# Patient Record
Sex: Female | Born: 1935 | Race: Black or African American | Hispanic: No | Smoking: Never smoker
Health system: Southern US, Community
[De-identification: ages and names within clinical notes are randomized; demographics above are authoritative.]

## PROBLEM LIST (undated history)

## (undated) DIAGNOSIS — H9313 Tinnitus, bilateral: Secondary | ICD-10-CM

## (undated) DIAGNOSIS — I10 Essential (primary) hypertension: Secondary | ICD-10-CM

## (undated) DIAGNOSIS — N189 Chronic kidney disease, unspecified: Secondary | ICD-10-CM

## (undated) DIAGNOSIS — H903 Sensorineural hearing loss, bilateral: Secondary | ICD-10-CM

## (undated) DIAGNOSIS — K8020 Calculus of gallbladder without cholecystitis without obstruction: Secondary | ICD-10-CM

## (undated) DIAGNOSIS — R2681 Unsteadiness on feet: Secondary | ICD-10-CM

## (undated) DIAGNOSIS — D509 Iron deficiency anemia, unspecified: Secondary | ICD-10-CM

## (undated) DIAGNOSIS — K222 Esophageal obstruction: Secondary | ICD-10-CM

## (undated) DIAGNOSIS — N840 Polyp of corpus uteri: Secondary | ICD-10-CM

## (undated) DIAGNOSIS — K449 Diaphragmatic hernia without obstruction or gangrene: Secondary | ICD-10-CM

## (undated) DIAGNOSIS — K5730 Diverticulosis of large intestine without perforation or abscess without bleeding: Secondary | ICD-10-CM

## (undated) DIAGNOSIS — K922 Gastrointestinal hemorrhage, unspecified: Secondary | ICD-10-CM

## (undated) DIAGNOSIS — M8580 Other specified disorders of bone density and structure, unspecified site: Secondary | ICD-10-CM

## (undated) HISTORY — DX: Sensorineural hearing loss, bilateral: H90.3

## (undated) HISTORY — DX: Iron deficiency anemia, unspecified: D50.9

## (undated) HISTORY — DX: Calculus of gallbladder without cholecystitis without obstruction: K80.20

## (undated) HISTORY — DX: Chronic kidney disease, unspecified: N18.9

## (undated) HISTORY — DX: Polyp of corpus uteri: N84.0

## (undated) HISTORY — DX: Diverticulosis of large intestine without perforation or abscess without bleeding: K57.30

## (undated) HISTORY — DX: Unsteadiness on feet: R26.81

## (undated) HISTORY — DX: Essential (primary) hypertension: I10

## (undated) HISTORY — PX: HYSTEROSCOPY: SHX211

## (undated) HISTORY — DX: Diaphragmatic hernia without obstruction or gangrene: K44.9

## (undated) HISTORY — DX: Tinnitus, bilateral: H93.13

## (undated) HISTORY — DX: Other specified disorders of bone density and structure, unspecified site: M85.80

## (undated) HISTORY — DX: Gastrointestinal hemorrhage, unspecified: K92.2

## (undated) HISTORY — DX: Esophageal obstruction: K22.2

---

## 1989-12-02 HISTORY — PX: HEMICOLECTOMY: SHX854

## 1998-03-06 ENCOUNTER — Encounter: Admission: RE | Admit: 1998-03-06 | Discharge: 1998-03-06 | Payer: Self-pay | Admitting: Internal Medicine

## 1998-05-01 ENCOUNTER — Encounter: Admission: RE | Admit: 1998-05-01 | Discharge: 1998-05-01 | Payer: Self-pay | Admitting: Internal Medicine

## 1998-05-01 ENCOUNTER — Other Ambulatory Visit: Admission: RE | Admit: 1998-05-01 | Discharge: 1998-05-01 | Payer: Self-pay | Admitting: Internal Medicine

## 1998-05-19 ENCOUNTER — Encounter: Admission: RE | Admit: 1998-05-19 | Discharge: 1998-05-19 | Payer: Self-pay | Admitting: Obstetrics & Gynecology

## 1998-08-08 ENCOUNTER — Encounter: Admission: RE | Admit: 1998-08-08 | Discharge: 1998-08-08 | Payer: Self-pay | Admitting: Internal Medicine

## 1998-11-06 ENCOUNTER — Encounter: Admission: RE | Admit: 1998-11-06 | Discharge: 1998-11-06 | Payer: Self-pay | Admitting: Internal Medicine

## 1999-02-07 ENCOUNTER — Encounter: Admission: RE | Admit: 1999-02-07 | Discharge: 1999-02-07 | Payer: Self-pay | Admitting: Internal Medicine

## 1999-02-13 ENCOUNTER — Encounter: Payer: Self-pay | Admitting: Internal Medicine

## 1999-02-13 ENCOUNTER — Ambulatory Visit (HOSPITAL_COMMUNITY): Admission: RE | Admit: 1999-02-13 | Discharge: 1999-02-13 | Payer: Self-pay | Admitting: Internal Medicine

## 1999-06-06 ENCOUNTER — Encounter: Admission: RE | Admit: 1999-06-06 | Discharge: 1999-06-06 | Payer: Self-pay | Admitting: Internal Medicine

## 1999-07-12 ENCOUNTER — Encounter: Admission: RE | Admit: 1999-07-12 | Discharge: 1999-07-12 | Payer: Self-pay | Admitting: Internal Medicine

## 1999-07-26 ENCOUNTER — Encounter: Admission: RE | Admit: 1999-07-26 | Discharge: 1999-07-26 | Payer: Self-pay | Admitting: Obstetrics

## 1999-09-13 ENCOUNTER — Encounter: Admission: RE | Admit: 1999-09-13 | Discharge: 1999-09-13 | Payer: Self-pay | Admitting: Obstetrics

## 1999-09-13 ENCOUNTER — Other Ambulatory Visit: Admission: RE | Admit: 1999-09-13 | Discharge: 1999-09-13 | Payer: Self-pay | Admitting: *Deleted

## 1999-09-27 ENCOUNTER — Encounter: Admission: RE | Admit: 1999-09-27 | Discharge: 1999-09-27 | Payer: Self-pay | Admitting: Obstetrics

## 1999-11-21 ENCOUNTER — Encounter: Admission: RE | Admit: 1999-11-21 | Discharge: 1999-11-21 | Payer: Self-pay | Admitting: Internal Medicine

## 2000-04-16 ENCOUNTER — Encounter: Admission: RE | Admit: 2000-04-16 | Discharge: 2000-04-16 | Payer: Self-pay | Admitting: Internal Medicine

## 2000-04-22 ENCOUNTER — Encounter: Payer: Self-pay | Admitting: Internal Medicine

## 2000-04-22 ENCOUNTER — Ambulatory Visit (HOSPITAL_COMMUNITY): Admission: RE | Admit: 2000-04-22 | Discharge: 2000-04-22 | Payer: Self-pay | Admitting: Internal Medicine

## 2000-04-30 ENCOUNTER — Encounter: Admission: RE | Admit: 2000-04-30 | Discharge: 2000-04-30 | Payer: Self-pay | Admitting: Internal Medicine

## 2000-06-30 ENCOUNTER — Encounter: Admission: RE | Admit: 2000-06-30 | Discharge: 2000-06-30 | Payer: Self-pay | Admitting: Internal Medicine

## 2000-07-01 ENCOUNTER — Encounter: Admission: RE | Admit: 2000-07-01 | Discharge: 2000-07-01 | Payer: Self-pay | Admitting: Obstetrics

## 2000-08-06 ENCOUNTER — Encounter: Admission: RE | Admit: 2000-08-06 | Discharge: 2000-08-06 | Payer: Self-pay | Admitting: Internal Medicine

## 2000-08-14 ENCOUNTER — Encounter: Admission: RE | Admit: 2000-08-14 | Discharge: 2000-08-14 | Payer: Self-pay | Admitting: Hematology and Oncology

## 2000-10-03 ENCOUNTER — Encounter: Admission: RE | Admit: 2000-10-03 | Discharge: 2000-10-03 | Payer: Self-pay | Admitting: Obstetrics & Gynecology

## 2000-10-20 ENCOUNTER — Ambulatory Visit (HOSPITAL_COMMUNITY): Admission: RE | Admit: 2000-10-20 | Discharge: 2000-10-20 | Payer: Self-pay | Admitting: Obstetrics & Gynecology

## 2000-10-20 ENCOUNTER — Encounter (INDEPENDENT_AMBULATORY_CARE_PROVIDER_SITE_OTHER): Payer: Self-pay | Admitting: Specialist

## 2000-10-20 DIAGNOSIS — N840 Polyp of corpus uteri: Secondary | ICD-10-CM | POA: Insufficient documentation

## 2001-01-21 ENCOUNTER — Encounter: Admission: RE | Admit: 2001-01-21 | Discharge: 2001-01-21 | Payer: Self-pay | Admitting: Internal Medicine

## 2001-03-11 ENCOUNTER — Encounter: Admission: RE | Admit: 2001-03-11 | Discharge: 2001-03-11 | Payer: Self-pay | Admitting: Internal Medicine

## 2001-04-23 ENCOUNTER — Ambulatory Visit (HOSPITAL_COMMUNITY): Admission: RE | Admit: 2001-04-23 | Discharge: 2001-04-23 | Payer: Self-pay | Admitting: Internal Medicine

## 2001-07-23 ENCOUNTER — Other Ambulatory Visit: Admission: RE | Admit: 2001-07-23 | Discharge: 2001-07-23 | Payer: Self-pay | Admitting: Internal Medicine

## 2001-07-23 ENCOUNTER — Encounter (INDEPENDENT_AMBULATORY_CARE_PROVIDER_SITE_OTHER): Payer: Self-pay | Admitting: Specialist

## 2001-08-12 ENCOUNTER — Encounter: Admission: RE | Admit: 2001-08-12 | Discharge: 2001-08-12 | Payer: Self-pay | Admitting: Internal Medicine

## 2001-08-27 ENCOUNTER — Encounter: Admission: RE | Admit: 2001-08-27 | Discharge: 2001-08-27 | Payer: Self-pay | Admitting: Internal Medicine

## 2001-09-28 ENCOUNTER — Encounter: Admission: RE | Admit: 2001-09-28 | Discharge: 2001-09-28 | Payer: Self-pay

## 2001-10-28 ENCOUNTER — Encounter: Admission: RE | Admit: 2001-10-28 | Discharge: 2001-10-28 | Payer: Self-pay | Admitting: Internal Medicine

## 2002-01-06 ENCOUNTER — Ambulatory Visit (HOSPITAL_COMMUNITY): Admission: RE | Admit: 2002-01-06 | Discharge: 2002-01-06 | Payer: Self-pay | Admitting: *Deleted

## 2002-01-06 ENCOUNTER — Encounter: Admission: RE | Admit: 2002-01-06 | Discharge: 2002-01-06 | Payer: Self-pay | Admitting: Internal Medicine

## 2002-02-03 ENCOUNTER — Encounter: Admission: RE | Admit: 2002-02-03 | Discharge: 2002-02-03 | Payer: Self-pay | Admitting: Internal Medicine

## 2002-04-28 ENCOUNTER — Ambulatory Visit (HOSPITAL_COMMUNITY): Admission: RE | Admit: 2002-04-28 | Discharge: 2002-04-28 | Payer: Self-pay | Admitting: Internal Medicine

## 2002-04-28 ENCOUNTER — Encounter: Payer: Self-pay | Admitting: Internal Medicine

## 2002-05-05 ENCOUNTER — Encounter: Admission: RE | Admit: 2002-05-05 | Discharge: 2002-05-05 | Payer: Self-pay | Admitting: Internal Medicine

## 2002-07-07 ENCOUNTER — Encounter: Admission: RE | Admit: 2002-07-07 | Discharge: 2002-07-07 | Payer: Self-pay | Admitting: Internal Medicine

## 2002-10-20 ENCOUNTER — Encounter: Admission: RE | Admit: 2002-10-20 | Discharge: 2002-10-20 | Payer: Self-pay | Admitting: Internal Medicine

## 2003-01-12 ENCOUNTER — Encounter: Admission: RE | Admit: 2003-01-12 | Discharge: 2003-01-12 | Payer: Self-pay | Admitting: Internal Medicine

## 2003-05-18 ENCOUNTER — Encounter: Admission: RE | Admit: 2003-05-18 | Discharge: 2003-05-18 | Payer: Self-pay | Admitting: Internal Medicine

## 2003-06-22 ENCOUNTER — Inpatient Hospital Stay (HOSPITAL_COMMUNITY): Admission: EM | Admit: 2003-06-22 | Discharge: 2003-06-29 | Payer: Self-pay | Admitting: *Deleted

## 2003-06-22 ENCOUNTER — Encounter: Payer: Self-pay | Admitting: Internal Medicine

## 2003-06-23 ENCOUNTER — Encounter: Payer: Self-pay | Admitting: Internal Medicine

## 2003-06-24 ENCOUNTER — Encounter: Payer: Self-pay | Admitting: Internal Medicine

## 2003-06-29 ENCOUNTER — Inpatient Hospital Stay: Admission: RE | Admit: 2003-06-29 | Discharge: 2003-07-06 | Payer: Self-pay | Admitting: Internal Medicine

## 2003-07-20 ENCOUNTER — Encounter: Admission: RE | Admit: 2003-07-20 | Discharge: 2003-07-20 | Payer: Self-pay | Admitting: Internal Medicine

## 2003-08-04 ENCOUNTER — Ambulatory Visit (HOSPITAL_COMMUNITY): Admission: RE | Admit: 2003-08-04 | Discharge: 2003-08-04 | Payer: Self-pay | Admitting: Internal Medicine

## 2003-09-14 ENCOUNTER — Inpatient Hospital Stay (HOSPITAL_COMMUNITY): Admission: EM | Admit: 2003-09-14 | Discharge: 2003-09-17 | Payer: Self-pay | Admitting: Emergency Medicine

## 2003-10-05 ENCOUNTER — Encounter: Admission: RE | Admit: 2003-10-05 | Discharge: 2003-10-05 | Payer: Self-pay | Admitting: Internal Medicine

## 2003-12-15 ENCOUNTER — Encounter: Admission: RE | Admit: 2003-12-15 | Discharge: 2003-12-15 | Payer: Self-pay | Admitting: Internal Medicine

## 2004-03-07 ENCOUNTER — Encounter: Admission: RE | Admit: 2004-03-07 | Discharge: 2004-03-07 | Payer: Self-pay | Admitting: Internal Medicine

## 2004-04-18 ENCOUNTER — Encounter: Payer: Self-pay | Admitting: Internal Medicine

## 2004-04-18 ENCOUNTER — Encounter: Admission: RE | Admit: 2004-04-18 | Discharge: 2004-04-18 | Payer: Self-pay | Admitting: Internal Medicine

## 2004-06-20 ENCOUNTER — Encounter: Admission: RE | Admit: 2004-06-20 | Discharge: 2004-06-20 | Payer: Self-pay | Admitting: Internal Medicine

## 2004-08-22 ENCOUNTER — Ambulatory Visit: Payer: Self-pay | Admitting: Internal Medicine

## 2004-08-29 ENCOUNTER — Ambulatory Visit (HOSPITAL_COMMUNITY): Admission: RE | Admit: 2004-08-29 | Discharge: 2004-08-29 | Payer: Self-pay | Admitting: *Deleted

## 2004-09-03 ENCOUNTER — Ambulatory Visit: Payer: Self-pay | Admitting: Internal Medicine

## 2004-09-12 ENCOUNTER — Ambulatory Visit: Payer: Self-pay | Admitting: Internal Medicine

## 2004-09-18 ENCOUNTER — Ambulatory Visit: Payer: Self-pay | Admitting: Internal Medicine

## 2004-10-17 ENCOUNTER — Ambulatory Visit: Payer: Self-pay | Admitting: Internal Medicine

## 2004-10-18 ENCOUNTER — Ambulatory Visit: Payer: Self-pay | Admitting: Internal Medicine

## 2004-10-18 ENCOUNTER — Ambulatory Visit: Payer: Self-pay | Admitting: Gastroenterology

## 2004-10-18 ENCOUNTER — Inpatient Hospital Stay (HOSPITAL_COMMUNITY): Admission: EM | Admit: 2004-10-18 | Discharge: 2004-10-20 | Payer: Self-pay | Admitting: Emergency Medicine

## 2004-12-19 ENCOUNTER — Ambulatory Visit: Payer: Self-pay | Admitting: Internal Medicine

## 2005-02-13 ENCOUNTER — Ambulatory Visit: Payer: Self-pay | Admitting: Internal Medicine

## 2005-02-13 ENCOUNTER — Ambulatory Visit (HOSPITAL_COMMUNITY): Admission: RE | Admit: 2005-02-13 | Discharge: 2005-02-13 | Payer: Self-pay | Admitting: Internal Medicine

## 2005-02-18 ENCOUNTER — Ambulatory Visit (HOSPITAL_COMMUNITY): Admission: RE | Admit: 2005-02-18 | Discharge: 2005-02-18 | Payer: Self-pay | Admitting: Internal Medicine

## 2005-05-01 ENCOUNTER — Ambulatory Visit: Payer: Self-pay | Admitting: Internal Medicine

## 2005-09-04 ENCOUNTER — Ambulatory Visit: Payer: Self-pay | Admitting: Internal Medicine

## 2005-09-06 ENCOUNTER — Ambulatory Visit (HOSPITAL_COMMUNITY): Admission: RE | Admit: 2005-09-06 | Discharge: 2005-09-06 | Payer: Self-pay | Admitting: Internal Medicine

## 2005-10-08 ENCOUNTER — Ambulatory Visit: Payer: Self-pay | Admitting: Internal Medicine

## 2005-11-06 ENCOUNTER — Ambulatory Visit: Payer: Self-pay | Admitting: Internal Medicine

## 2005-11-20 ENCOUNTER — Ambulatory Visit: Payer: Self-pay | Admitting: Cardiology

## 2005-11-20 ENCOUNTER — Ambulatory Visit (HOSPITAL_COMMUNITY): Admission: RE | Admit: 2005-11-20 | Discharge: 2005-11-20 | Payer: Self-pay | Admitting: Internal Medicine

## 2005-11-20 ENCOUNTER — Encounter: Payer: Self-pay | Admitting: Cardiology

## 2006-01-22 ENCOUNTER — Ambulatory Visit: Payer: Self-pay | Admitting: Internal Medicine

## 2006-02-05 ENCOUNTER — Ambulatory Visit: Payer: Self-pay | Admitting: Internal Medicine

## 2006-02-19 ENCOUNTER — Ambulatory Visit: Payer: Self-pay | Admitting: Hospitalist

## 2006-03-05 ENCOUNTER — Ambulatory Visit: Payer: Self-pay | Admitting: Internal Medicine

## 2006-04-02 ENCOUNTER — Ambulatory Visit: Payer: Self-pay | Admitting: Internal Medicine

## 2006-05-07 ENCOUNTER — Ambulatory Visit: Payer: Self-pay | Admitting: Internal Medicine

## 2006-07-02 ENCOUNTER — Ambulatory Visit: Payer: Self-pay | Admitting: Internal Medicine

## 2006-09-09 ENCOUNTER — Ambulatory Visit (HOSPITAL_COMMUNITY): Admission: RE | Admit: 2006-09-09 | Discharge: 2006-09-09 | Payer: Self-pay | Admitting: Internal Medicine

## 2006-11-05 ENCOUNTER — Ambulatory Visit: Payer: Self-pay | Admitting: Internal Medicine

## 2006-11-05 LAB — CONVERTED CEMR LAB
ALT: 21 units/L (ref 0–35)
Albumin: 4.7 g/dL (ref 3.5–5.2)
Basophils Absolute: 0 10*3/uL (ref 0.0–0.1)
CO2: 25 meq/L (ref 19–32)
Chloride: 102 meq/L (ref 96–112)
Glucose, Bld: 85 mg/dL (ref 70–99)
Lymphocytes Relative: 39 % (ref 15–43)
MCHC: 33.6 g/dL (ref 33.1–35.4)
Neutro Abs: 3.2 10*3/uL (ref 1.8–6.8)
Neutrophils Relative %: 51 % (ref 47–77)
Platelets: 294 10*3/uL (ref 152–374)
Potassium: 4 meq/L (ref 3.5–5.3)
RDW: 15.2 % (ref 11.5–15.3)
Sodium: 141 meq/L (ref 135–145)
TSH: 1.409 microintl units/mL (ref 0.350–5.50)
Total Bilirubin: 0.5 mg/dL (ref 0.3–1.2)
Total Protein: 7.5 g/dL (ref 6.0–8.3)

## 2007-01-15 ENCOUNTER — Telehealth (INDEPENDENT_AMBULATORY_CARE_PROVIDER_SITE_OTHER): Payer: Self-pay | Admitting: *Deleted

## 2007-02-05 ENCOUNTER — Ambulatory Visit: Admission: RE | Admit: 2007-02-05 | Discharge: 2007-02-05 | Payer: Self-pay | Admitting: Internal Medicine

## 2007-02-05 ENCOUNTER — Encounter: Payer: Self-pay | Admitting: Internal Medicine

## 2007-02-27 ENCOUNTER — Telehealth (INDEPENDENT_AMBULATORY_CARE_PROVIDER_SITE_OTHER): Payer: Self-pay | Admitting: *Deleted

## 2007-04-22 ENCOUNTER — Ambulatory Visit: Payer: Self-pay | Admitting: Internal Medicine

## 2007-04-22 DIAGNOSIS — I10 Essential (primary) hypertension: Secondary | ICD-10-CM

## 2007-04-22 DIAGNOSIS — Z860100 Personal history of colon polyps, unspecified: Secondary | ICD-10-CM | POA: Insufficient documentation

## 2007-04-22 DIAGNOSIS — H903 Sensorineural hearing loss, bilateral: Secondary | ICD-10-CM

## 2007-04-22 DIAGNOSIS — M109 Gout, unspecified: Secondary | ICD-10-CM

## 2007-04-22 DIAGNOSIS — Z8711 Personal history of peptic ulcer disease: Secondary | ICD-10-CM

## 2007-04-22 DIAGNOSIS — H9319 Tinnitus, unspecified ear: Secondary | ICD-10-CM | POA: Insufficient documentation

## 2007-04-22 DIAGNOSIS — K5730 Diverticulosis of large intestine without perforation or abscess without bleeding: Secondary | ICD-10-CM | POA: Insufficient documentation

## 2007-04-22 DIAGNOSIS — R748 Abnormal levels of other serum enzymes: Secondary | ICD-10-CM | POA: Insufficient documentation

## 2007-04-22 DIAGNOSIS — Z9889 Other specified postprocedural states: Secondary | ICD-10-CM | POA: Insufficient documentation

## 2007-04-22 LAB — CONVERTED CEMR LAB
ALT: 17 units/L (ref 0–35)
AST: 23 units/L (ref 0–37)
Basophils Absolute: 0 10*3/uL (ref 0.0–0.1)
Basophils Relative: 1 % (ref 0–1)
Chloride: 103 meq/L (ref 96–112)
Creatinine, Ser: 1.15 mg/dL (ref 0.40–1.20)
Eosinophils Relative: 1 % (ref 0–5)
Hemoglobin: 11.9 g/dL — ABNORMAL LOW (ref 12.0–15.0)
MCHC: 32.3 g/dL (ref 30.0–36.0)
Monocytes Absolute: 0.7 10*3/uL (ref 0.2–0.7)
Neutro Abs: 3.7 10*3/uL (ref 1.7–7.7)
Platelets: 315 10*3/uL (ref 150–400)
RDW: 16 % — ABNORMAL HIGH (ref 11.5–14.0)
Sodium: 140 meq/L (ref 135–145)
Total Bilirubin: 0.6 mg/dL (ref 0.3–1.2)

## 2007-08-05 ENCOUNTER — Ambulatory Visit: Payer: Self-pay | Admitting: Internal Medicine

## 2007-08-05 LAB — CONVERTED CEMR LAB
ALT: 15 units/L (ref 0–35)
Alkaline Phosphatase: 163 units/L — ABNORMAL HIGH (ref 39–117)
Basophils Absolute: 0 10*3/uL (ref 0.0–0.1)
Basophils Relative: 1 % (ref 0–1)
Eosinophils Absolute: 0.1 10*3/uL (ref 0.0–0.7)
Eosinophils Relative: 1 % (ref 0–5)
HCT: 37.1 % (ref 36.0–46.0)
MCV: 87.1 fL (ref 78.0–100.0)
Platelets: 257 10*3/uL (ref 150–400)
RDW: 15.5 % — ABNORMAL HIGH (ref 11.5–14.0)
Sodium: 141 meq/L (ref 135–145)
Total Bilirubin: 0.5 mg/dL (ref 0.3–1.2)
Total Protein: 7.1 g/dL (ref 6.0–8.3)

## 2007-09-11 ENCOUNTER — Ambulatory Visit (HOSPITAL_COMMUNITY): Admission: RE | Admit: 2007-09-11 | Discharge: 2007-09-11 | Payer: Self-pay | Admitting: Internal Medicine

## 2007-11-16 ENCOUNTER — Ambulatory Visit: Payer: Self-pay | Admitting: Internal Medicine

## 2008-01-27 ENCOUNTER — Ambulatory Visit: Payer: Self-pay | Admitting: Internal Medicine

## 2008-02-09 ENCOUNTER — Ambulatory Visit: Payer: Self-pay | Admitting: Internal Medicine

## 2008-02-09 ENCOUNTER — Encounter: Payer: Self-pay | Admitting: Internal Medicine

## 2008-02-09 LAB — HM COLONOSCOPY

## 2008-04-13 ENCOUNTER — Ambulatory Visit: Payer: Self-pay | Admitting: Internal Medicine

## 2008-04-13 LAB — CONVERTED CEMR LAB
Alkaline Phosphatase: 151 units/L — ABNORMAL HIGH (ref 39–117)
BUN: 20 mg/dL (ref 6–23)
Creatinine, Ser: 1.08 mg/dL (ref 0.40–1.20)
Eosinophils Absolute: 0.1 10*3/uL (ref 0.0–0.7)
Eosinophils Relative: 1 % (ref 0–5)
Glucose, Bld: 94 mg/dL (ref 70–99)
Glucose, Urine, Semiquant: NEGATIVE
HCT: 38.4 % (ref 36.0–46.0)
HDL: 84 mg/dL (ref >39)
Hemoglobin, Urine: NEGATIVE
Ketones, ur: NEGATIVE mg/dL
LDL Cholesterol: 103 mg/dL — ABNORMAL HIGH (ref 0–99)
Lymphs Abs: 2.5 10*3/uL (ref 0.7–4.0)
MCV: 90.4 fL (ref 78.0–100.0)
Monocytes Relative: 10 % (ref 3–12)
Nitrite: NEGATIVE
Nitrite: NEGATIVE
Platelets: 270 10*3/uL (ref 150–400)
Protein, U semiquant: 30
Sodium: 142 meq/L (ref 135–145)
Specific Gravity, Urine: 1.01
Total Bilirubin: 0.6 mg/dL (ref 0.3–1.2)
Total CHOL/HDL Ratio: 2.4
Triglycerides: 89 mg/dL (ref <150)
Urobilinogen, UA: 0.2 (ref 0.0–1.0)
VLDL: 18 mg/dL (ref 0–40)
WBC: 6.5 10*3/uL (ref 4.0–10.5)

## 2008-06-25 ENCOUNTER — Telehealth (INDEPENDENT_AMBULATORY_CARE_PROVIDER_SITE_OTHER): Payer: Self-pay | Admitting: *Deleted

## 2008-10-13 ENCOUNTER — Ambulatory Visit (HOSPITAL_COMMUNITY): Admission: RE | Admit: 2008-10-13 | Discharge: 2008-10-13 | Payer: Self-pay | Admitting: Internal Medicine

## 2009-01-18 ENCOUNTER — Ambulatory Visit: Payer: Self-pay | Admitting: Internal Medicine

## 2009-01-18 DIAGNOSIS — R5383 Other fatigue: Secondary | ICD-10-CM

## 2009-01-18 DIAGNOSIS — R5381 Other malaise: Secondary | ICD-10-CM | POA: Insufficient documentation

## 2009-01-18 LAB — CONVERTED CEMR LAB
AST: 19 units/L (ref 0–37)
Albumin: 4.4 g/dL (ref 3.5–5.2)
BUN: 21 mg/dL (ref 6–23)
Basophils Relative: 0 % (ref 0–1)
Calcium: 8.9 mg/dL (ref 8.4–10.5)
Chloride: 100 meq/L (ref 96–112)
Creatinine, Ser: 1.01 mg/dL (ref 0.40–1.20)
Eosinophils Absolute: 0 10*3/uL (ref 0.0–0.7)
Glucose, Bld: 92 mg/dL (ref 70–99)
Hemoglobin: 12.9 g/dL (ref 12.0–15.0)
Lymphs Abs: 1.4 10*3/uL (ref 0.7–4.0)
MCHC: 32.9 g/dL (ref 30.0–36.0)
MCV: 89.7 fL (ref 78.0–100.0)
Monocytes Absolute: 0.8 10*3/uL (ref 0.1–1.0)
Monocytes Relative: 14 % — ABNORMAL HIGH (ref 3–12)
Neutro Abs: 3.8 10*3/uL (ref 1.7–7.7)
RBC: 4.37 M/uL (ref 3.87–5.11)
TSH: 1.081 microintl units/mL (ref 0.350–4.50)
WBC: 6 10*3/uL (ref 4.0–10.5)

## 2009-02-03 ENCOUNTER — Telehealth: Payer: Self-pay | Admitting: Internal Medicine

## 2009-04-05 ENCOUNTER — Ambulatory Visit: Payer: Self-pay | Admitting: Internal Medicine

## 2009-06-28 ENCOUNTER — Ambulatory Visit: Payer: Self-pay | Admitting: Internal Medicine

## 2009-10-16 ENCOUNTER — Encounter: Payer: Self-pay | Admitting: Internal Medicine

## 2009-10-16 ENCOUNTER — Ambulatory Visit (HOSPITAL_COMMUNITY): Admission: RE | Admit: 2009-10-16 | Discharge: 2009-10-16 | Payer: Self-pay | Admitting: Internal Medicine

## 2009-12-06 ENCOUNTER — Ambulatory Visit: Payer: Self-pay | Admitting: Internal Medicine

## 2009-12-06 DIAGNOSIS — M8580 Other specified disorders of bone density and structure, unspecified site: Secondary | ICD-10-CM | POA: Insufficient documentation

## 2009-12-07 ENCOUNTER — Encounter: Payer: Self-pay | Admitting: Internal Medicine

## 2009-12-07 DIAGNOSIS — E559 Vitamin D deficiency, unspecified: Secondary | ICD-10-CM | POA: Insufficient documentation

## 2010-01-01 ENCOUNTER — Telehealth: Payer: Self-pay | Admitting: Internal Medicine

## 2010-01-12 LAB — CONVERTED CEMR LAB
ALT: 13 units/L (ref 0–35)
AST: 36 units/L (ref 0–37)
Albumin: 4.9 g/dL (ref 3.5–5.2)
Alkaline Phosphatase: 125 units/L — ABNORMAL HIGH (ref 39–117)
BUN: 24 mg/dL — ABNORMAL HIGH (ref 6–23)
Calcium: 9.3 mg/dL (ref 8.4–10.5)
Chloride: 102 meq/L (ref 96–112)
Potassium: 4.3 meq/L (ref 3.5–5.3)
Sodium: 141 meq/L (ref 135–145)
Total Protein: 7.5 g/dL (ref 6.0–8.3)

## 2010-03-07 ENCOUNTER — Ambulatory Visit: Payer: Self-pay | Admitting: Internal Medicine

## 2010-03-07 LAB — CONVERTED CEMR LAB
BUN: 28 mg/dL — ABNORMAL HIGH (ref 6–23)
Calcium: 9.3 mg/dL (ref 8.4–10.5)
Cholesterol: 212 mg/dL — ABNORMAL HIGH (ref 0–200)
Creatinine, Ser: 1.39 mg/dL — ABNORMAL HIGH (ref 0.40–1.20)
Glucose, Bld: 90 mg/dL (ref 70–99)
Triglycerides: 87 mg/dL (ref <150)
VLDL: 17 mg/dL (ref 0–40)

## 2010-06-06 ENCOUNTER — Ambulatory Visit: Payer: Self-pay | Admitting: Internal Medicine

## 2010-06-06 DIAGNOSIS — N189 Chronic kidney disease, unspecified: Secondary | ICD-10-CM

## 2010-06-06 LAB — CONVERTED CEMR LAB
ALT: 16 units/L (ref 0–35)
Basophils Relative: 1 % (ref 0–1)
CO2: 21 meq/L (ref 19–32)
Calcium: 9.2 mg/dL (ref 8.4–10.5)
Chloride: 104 meq/L (ref 96–112)
Creatinine, Ser: 1.37 mg/dL — ABNORMAL HIGH (ref 0.40–1.20)
Eosinophils Absolute: 0 10*3/uL (ref 0.0–0.7)
Glucose, Bld: 87 mg/dL (ref 70–99)
Hemoglobin: 11.9 g/dL — ABNORMAL LOW (ref 12.0–15.0)
Lymphs Abs: 1.9 10*3/uL (ref 0.7–4.0)
MCHC: 33.6 g/dL (ref 30.0–36.0)
MCV: 88.5 fL (ref >=78.0)
Monocytes Absolute: 0.5 10*3/uL (ref 0.1–1.0)
Monocytes Relative: 9 % (ref 3–12)
Neutrophils Relative %: 58 % (ref 43–77)
RBC: 4 M/uL (ref 3.87–5.11)
Sodium: 143 meq/L (ref 135–145)
TSH: 1.052 microintl units/mL (ref 0.350–4.5)
Total Protein: 7.3 g/dL (ref 6.0–8.3)
WBC: 5.9 10*3/uL (ref 4.0–10.5)

## 2010-08-19 ENCOUNTER — Inpatient Hospital Stay (HOSPITAL_COMMUNITY): Admission: EM | Admit: 2010-08-19 | Discharge: 2010-08-21 | Payer: Self-pay | Admitting: Emergency Medicine

## 2010-08-19 ENCOUNTER — Ambulatory Visit: Payer: Self-pay | Admitting: Internal Medicine

## 2010-08-19 ENCOUNTER — Encounter: Payer: Self-pay | Admitting: Internal Medicine

## 2010-08-21 ENCOUNTER — Encounter: Payer: Self-pay | Admitting: Internal Medicine

## 2010-08-29 ENCOUNTER — Ambulatory Visit: Payer: Self-pay | Admitting: Internal Medicine

## 2010-08-29 DIAGNOSIS — D509 Iron deficiency anemia, unspecified: Secondary | ICD-10-CM | POA: Insufficient documentation

## 2010-08-29 LAB — CONVERTED CEMR LAB
Basophils Absolute: 0 10*3/uL (ref 0.0–0.1)
Lymphocytes Relative: 30 % (ref 12–46)
Lymphs Abs: 2.1 10*3/uL (ref 0.7–4.0)
Neutro Abs: 4.3 10*3/uL (ref 1.7–7.7)
Platelets: 320 10*3/uL (ref 150–400)
RDW: 15.5 % (ref 11.5–15.5)
WBC: 7 10*3/uL (ref 4.0–10.5)

## 2010-08-30 DIAGNOSIS — K921 Melena: Secondary | ICD-10-CM

## 2010-10-09 ENCOUNTER — Telehealth (INDEPENDENT_AMBULATORY_CARE_PROVIDER_SITE_OTHER): Payer: Self-pay | Admitting: *Deleted

## 2010-10-10 ENCOUNTER — Ambulatory Visit: Payer: Self-pay | Admitting: Internal Medicine

## 2010-10-15 DIAGNOSIS — K5900 Constipation, unspecified: Secondary | ICD-10-CM | POA: Insufficient documentation

## 2010-10-18 ENCOUNTER — Ambulatory Visit (HOSPITAL_COMMUNITY)
Admission: RE | Admit: 2010-10-18 | Discharge: 2010-10-18 | Payer: Self-pay | Source: Home / Self Care | Admitting: Internal Medicine

## 2010-10-18 LAB — HM MAMMOGRAPHY: HM Mammogram: NEGATIVE

## 2010-11-22 LAB — CONVERTED CEMR LAB
ALT: 17 units/L (ref 0–35)
AST: 29 units/L (ref 0–37)
Albumin: 4.7 g/dL (ref 3.5–5.2)
Alkaline Phosphatase: 115 units/L (ref 39–117)
BUN: 27 mg/dL — ABNORMAL HIGH (ref 6–23)
Basophils Absolute: 0 10*3/uL (ref 0.0–0.1)
Basophils Relative: 1 % (ref 0–1)
Chloride: 99 meq/L (ref 96–112)
Ferritin: 33 ng/mL (ref 10–291)
Hemoglobin: 11 g/dL — ABNORMAL LOW (ref 12.0–15.0)
MCHC: 33.2 g/dL (ref 30.0–36.0)
Monocytes Absolute: 0.6 10*3/uL (ref 0.1–1.0)
Neutro Abs: 3.5 10*3/uL (ref 1.7–7.7)
Platelets: 316 10*3/uL (ref 150–400)
Potassium: 3.9 meq/L (ref 3.5–5.3)
RDW: 15.9 % — ABNORMAL HIGH (ref 11.5–15.5)
Sodium: 138 meq/L (ref 135–145)
Total Protein: 7.3 g/dL (ref 6.0–8.3)

## 2010-12-12 ENCOUNTER — Ambulatory Visit: Admission: RE | Admit: 2010-12-12 | Discharge: 2010-12-12 | Payer: Self-pay | Source: Home / Self Care

## 2010-12-12 ENCOUNTER — Encounter: Payer: Self-pay | Admitting: Internal Medicine

## 2010-12-12 LAB — CONVERTED CEMR LAB
BUN: 22 mg/dL (ref 6–23)
Creatinine, Ser: 1.62 mg/dL — ABNORMAL HIGH (ref 0.40–1.20)
Potassium: 4 meq/L (ref 3.5–5.3)

## 2011-01-01 NOTE — Medication Information (Signed)
Summary: [facility]  [facility]   Imported By: Shon Hough 06/22/2010 14:16:45  _____________________________________________________________________  External Attachment:    Type:   Image     Comment:   External Document

## 2011-01-01 NOTE — Assessment & Plan Note (Signed)
Summary: EST-6 WEEK RECHECK/CH   Vital Signs:  Patient profile:   77 year old female Height:      59 inches Weight:      128.1 pounds BMI:     25.97 Temp:     97.8 degrees F oral Pulse rate:   74 / minute BP sitting:   125 / 83  (right arm)  Vitals Entered By: Filomena Jungling NT II (October 10, 2010 10:57 AM) CC: TROUBLE WITH BOWELS / FEELING TIRED Is Patient Diabetic? No Pain Assessment Patient in pain? no      Nutritional Status BMI of 25 - 29 = overweight  Have you ever been in a relationship where you felt threatened, hurt or afraid?No   Does patient need assistance? Functional Status Self care Ambulation Normal   Primary Care Provider:  Margarito Liner MD  CC:  TROUBLE WITH BOWELS / FEELING TIRED.  History of Present Illness: Patient returns for followup of her chronic problems including history of GI bleeding in September, anemia, hypertension, gout, and chronic renal insufficiency. Her main complaint today is constipation. She denies any blood in her stools or melena. She has had no recent gout flares. She reports that she is compliant with her medications. She does report a chronic feeling of fatigability with no significant change in that symptom.  Preventive Screening-Counseling & Management  Alcohol-Tobacco     Smoking Status: never  Current Medications (verified): 1)  Allopurinol 100 Mg Tabs (Allopurinol) .... Take 2 Tablets By Mouth Once A Day 2)  Famotidine 20 Mg Tabs (Famotidine) .... Take 1 Tablet By Mouth Once A Day 3)  Diltiazem Hcl Er Beads 420 Mg Cp24 (Diltiazem Hcl Er Beads) .... Take 1 Capsule By Mouth Once A Day 4)  Colchicine 0.6 Mg Tabs (Colchicine) .... Take 1 Tablet By Mouth Once A Day 5)  Cozaar 50 Mg Tabs (Losartan Potassium) .... Take 1 Tablet By Mouth Once A Day 6)  Calcium Carbonate-Vitamin D 600-400 Mg-Unit Tabs (Calcium Carbonate-Vitamin D) .... Take 1 Tablet By Mouth Two Times A Day  Allergies (verified): No Known Drug Allergies  Review  of Systems General:  Complains of fatigue. CV:  Denies chest pain or discomfort. Resp:  Denies shortness of breath. GI:  Complains of constipation; denies abdominal pain, bloody stools, dark tarry stools, nausea, and vomiting.  Physical Exam  General:  alert, no distress Lungs:  normal respiratory effort, normal breath sounds, no crackles, and no wheezes.   Heart:  normal rate, regular rhythm, no murmur, no gallop, and no rub.   Abdomen:  soft, non-tender, and normal bowel sounds.   Extremities:  no edema   Impression & Recommendations:  Problem # 1:  HYPERTENSION (ICD-401.9) Patient's blood pressure is at goal on current regimen; will continue current medications.  Her updated medication list for this problem includes:    Diltiazem Hcl Er Beads 420 Mg Cp24 (Diltiazem hcl er beads) .[name]... Take 1 capsule by mouth once a day    Cozaar 50 Mg Tabs (Losartan potassium) .[name]... Take 1 tablet by mouth once a day  BP today: 125/83 Prior BP: 128/77 (08/29/2010)  Prior 10 Yr Risk Heart Disease: 9 % (06/06/2010)  Labs Reviewed: K+: 3.9 (06/06/2010) Creat: : 1.37 (06/06/2010)   Chol: 212 (03/07/2010)   HDL: 69 (03/07/2010)   LDL: 126 (03/07/2010)   TG: 87 (03/07/2010)  Problem # 2:  RENAL INSUFFICIENCY (ICD-588.9) Will check a metabolic panel today.  Orders: T-CMP with Estimated GFR ([id number])  Problem #  3:  GOUT (ICD-274.9) Patient has had no recent gout flares. Will continue allopurinol and colchicine.  Her updated medication list for this problem includes:    Allopurinol 100 Mg Tabs (Allopurinol) .[name]... Take 2 tablets by mouth once a day    Colchicine 0.6 Mg Tabs (Colchicine) .[name]... Take 1 tablet by mouth once a day  Problem # 4:  ANEMIA-IRON DEFICIENCY (ICD-280.9) Will check a CBC with differential and ferritin level.  Orders: T-CBC w/Diff [id number]) T-Ferritin [id number])  Hgb: 9.6 (08/29/2010)   Hct: 29.0 (08/29/2010)   Platelets: 320 (08/29/2010) RBC: 3.27  (08/29/2010)   RDW: 15.5 (08/29/2010)   WBC: 7.0 (08/29/2010) MCV: 88.7 (08/29/2010)   MCHC: 33.1 (08/29/2010) TSH: 1.052 (06/06/2010)  Problem # 5:  DIVERTICULOSIS, COLON (ICD-562.10) Patient has had no further signs of GI bleeding since her hospitalization in September.  Will check a CBC today.  Labs Reviewed: Hgb: 9.6 (08/29/2010)   Hct: 29.0 (08/29/2010)   WBC: 7.0 (08/29/2010)  Problem # 6:  CONSTIPATION (ICD-564.00) Will treat with Colace; I advised her to let me know if this does not improve.  Her updated medication list for this problem includes:    Docusate Sodium 100 Mg Caps (Docusate sodium) .[name]... Take 1 capsule by mouth once a day  Problem # 7:  Preventive Health Care (ICD-V70.0) Patient requested that we defer her Pap smear.  Complete Medication List: 1)  Allopurinol 100 Mg Tabs (Allopurinol) .... Take 2 tablets by mouth once a day 2)  Famotidine 20 Mg Tabs (Famotidine) .... Take 1 tablet by mouth once a day 3)  Diltiazem Hcl Er Beads 420 Mg Cp24 (Diltiazem hcl er beads) .... Take 1 capsule by mouth once a day 4)  Colchicine 0.6 Mg Tabs (Colchicine) .... Take 1 tablet by mouth once a day 5)  Cozaar 50 Mg Tabs (Losartan potassium) .... Take 1 tablet by mouth once a day 6)  Calcium Carbonate-vitamin D 600-400 Mg-unit Tabs (Calcium carbonate-vitamin d) .... Take 1 tablet by mouth two times a day 7)  Docusate Sodium 100 Mg Caps (Docusate sodium) .... Take 1 capsule by mouth once a day  Patient Instructions: 1)  Please schedule a follow-up appointment in 3 months. 2)  Start docusate sodium (Colace) 100 mg one capsule daily as a stool softener.   Orders Added: 1)  T-CBC w/Diff [[id number]] 2)  T-Ferritin [[id number]] 3)  T-CMP with Estimated GFR [[id number]] 4)  Est. Patient Level III [63875]    Prevention & Chronic Care Immunizations   Influenza vaccine: Fluvax MCR  (08/29/2010)   Influenza vaccine due: 08/02/2010    Tetanus booster: 06/06/2010: Tdap   Td  booster deferral: Deferred  (03/07/2010)    Pneumococcal vaccine: Pneumovax  (12/06/2009)   Pneumococcal vaccine deferral: Deferred  (06/28/2009)    H. zoster vaccine: Not documented   H. zoster vaccine deferral: Deferred  (06/28/2009)  Colorectal Screening   Hemoccult: Not documented   Hemoccult action/deferral: Deferred  (08/29/2010)    Colonoscopy: Right hemicolectomy.  Normal appearing ileocolic anastamosis.  Narrow tortuous sigmoid colon with thickened folds and deep diverticuli, difficult to advance through the sigmoid colon.  Normal rectum. Colonoscopy by: Dr. [name]  (02/09/2008)  Other Screening   Pap smear: Specimen Adequacy: Satisfactory for evaluation.   Interpretation/Result:Negative for intraepithelial Lesion or Malignancy.     (04/18/2004)   Pap smear action/deferral: GYN Referral  (03/07/2010)   Pap smear due: 05/2007    Mammogram: ASSESSMENT: Negative - BI-RADS 1^MM DIGITAL SCREENING  (10/16/2009)   Mammogram  due: 10/16/2010    DXA bone density scan: Osteopenia. Lumbar spine T score -1.5 Left femur neck T score -1.7 Right femur neck T score of -2.0   (10/16/2009)   DXA bone density action/deferral: Ordered  (06/28/2009)   Smoking status: never  (10/10/2010)  Lipids   Total Cholesterol: 212  (03/07/2010)   Lipid panel action/deferral: Lipid Panel ordered   LDL: 126  (03/07/2010)   LDL Direct: Not documented   HDL: 69  (03/07/2010)   Triglycerides: 87  (03/07/2010)   Lipid panel due: 04/13/2013  Hypertension   Last Blood Pressure: 125 / 83  (10/10/2010)   Serum creatinine: 1.37  (06/06/2010)   Serum potassium 3.9  (06/06/2010)   Basic metabolic panel due: 07/18/2009    Hypertension flowsheet reviewed?: Yes   Progress toward BP goal: At goal  Self-Management Support :   Personal Goals (by the next clinic visit) :      Personal blood pressure goal: 140/90  (06/28/2009)   Patient will work on the following items until the next clinic visit to  reach self-care goals:     Medications and monitoring: take my medicines every day, bring all of my medications to every visit  (10/10/2010)     Eating: drink diet soda or water instead of juice or soda, eat more vegetables, use fresh or frozen vegetables, eat foods that are low in salt, eat baked foods instead of fried foods, eat fruit for snacks and desserts  (10/10/2010)     Activity: take a 30 minute walk every day  (10/10/2010)    Hypertension self-management support: Education handout, Written self-care plan  (10/10/2010)   Hypertension self-care plan printed.   Hypertension education handout printed   Process Orders Check Orders Results:     [facility]: Check successful Tests Sent for requisitioning (October 15, 2010 10:45 AM):     10/10/2010: [facility] -- T-CBC w/Diff [[id number]] (signed)     10/10/2010: [facility] -- T-Ferritin [phone] (signed)     10/10/2010: [facility] -- T-CMP with Estimated GFR [[id number]] (signed)

## 2011-01-01 NOTE — Assessment & Plan Note (Signed)
Summary: EST-CK/FU/MEDS/CFB   Vital Signs:  Patient profile:   77 year old female Height:      59 inches (149.86 cm) Weight:      133.8 pounds (60.82 kg) BMI:     27.12 Temp:     97.0 degrees F (36.11 degrees C) oral Pulse rate:   82 / minute BP sitting:   145 / 90  (left arm) Cuff size:   large  Vitals Entered By: Cynda Familia Duncan Dull) (June 06, 2010 9:51 AM) CC: routine f/u, c/o occasional episodes of left shoulder pain  Is Patient Diabetic? No Pain Assessment Patient in pain? no      Nutritional Status BMI of 25 - 29 = overweight  Have you ever been in a relationship where you felt threatened, hurt or afraid?No   Does patient need assistance? Functional Status Self care Ambulation Normal    Primary Care Provider:  Margarito Liner MD  CC:  routine f/u and c/o occasional episodes of left shoulder pain .  History of Present Illness: Patient returns for followup of her hypertension, gout, osteopenia, and other chronic medical problems. Today she reports feeling tired; she also complains of an occasional brief left arm pain whenever she raises her arm quickly. She has no other complaints today. She says that she is compliant with her medications. She has had no recent gout symptoms.  Preventive Screening-Counseling & Management  Alcohol-Tobacco     Smoking Status: never  Current Medications (verified): 1)  Allopurinol 100 Mg Tabs (Allopurinol) .... Take 2 Tablets By Mouth Once A Day 2)  Famotidine 20 Mg Tabs (Famotidine) .... Take 1 Tablet By Mouth Once A Day 3)  Diltiazem Hcl Er Beads 420 Mg Cp24 (Diltiazem Hcl Er Beads) .... Take 1 Capsule By Mouth Once A Day 4)  Colchicine 0.6 Mg Tabs (Colchicine) .... Take 1 Tablet By Mouth Once A Day 5)  Cozaar 50 Mg Tabs (Losartan Potassium) .... Take 1 Tablet By Mouth Once A Day 6)  Calcium Carbonate-Vitamin D 600-400 Mg-Unit Tabs (Calcium Carbonate-Vitamin D) .... Take 1 Tablet By Mouth Two Times A Day  Allergies  (verified): No Known Drug Allergies  Review of Systems CV:  Denies chest pain or discomfort and swelling of feet. Resp:  Denies shortness of breath. GI:  Denies abdominal pain, nausea, and vomiting. GU:  Denies dysuria and urinary frequency.  Physical Exam  General:  alert, no distress Lungs:  normal respiratory effort, normal breath sounds, no crackles, and no wheezes.   Heart:  normal rate, regular rhythm, no murmur, no gallop, and no rub.   Extremities:  no edema   Impression & Recommendations:  Problem # 1:  HYPERTENSION (ICD-401.9) Patient's repeat blood pressure was 130/70, which is at goal. The plan is to continue current medication regimen.  Her updated medication list for this problem includes:    Diltiazem Hcl Er Beads 420 Mg Cp24 (Diltiazem hcl er beads) .[name]... Take 1 capsule by mouth once a day    Cozaar 50 Mg Tabs (Losartan potassium) .[name]... Take 1 tablet by mouth once a day  BP today: 145/90 Prior BP: 140/83 (03/07/2010)  10 Yr Risk Heart Disease: 9 %  Labs Reviewed: K+: 3.5 (03/07/2010) Creat: : 1.39 (03/07/2010)   Chol: 212 (03/07/2010)   HDL: 69 (03/07/2010)   LDL: 126 (03/07/2010)   TG: 87 (03/07/2010)  Problem # 2:  RENAL INSUFFICIENCY (ICD-588.9) Plan is to check a metabolic panel today.  Orders: T-Comprehensive Metabolic Panel [id number])  Problem # 3:  GOUT (ICD-274.9) Patient is doing well on current regimen, without any recent gout symptoms. The plan is to continue allopurinol and colchicine at current dose.  Her updated medication list for this problem includes:    Allopurinol 100 Mg Tabs (Allopurinol) .[name]... Take 2 tablets by mouth once a day    Colchicine 0.6 Mg Tabs (Colchicine) .[name]... Take 1 tablet by mouth once a day  Problem # 4:  FATIGUE (ICD-780.79)  Patient has complained of fatigue in the past, and this is a somewhat chronic symptom.  Will check a TSH and CBC with differential.  Orders: T-CBC w/Diff ([id number]) T-TSH  ([id number])  Problem # 5:  OSTEOPENIA (ICD-733.90) I discussed with patient the importance of taking the calcium with vitamin D supplement, and she agreed to do so.  Complete Medication List: 1)  Allopurinol 100 Mg Tabs (Allopurinol) .... Take 2 tablets by mouth once a day 2)  Famotidine 20 Mg Tabs (Famotidine) .... Take 1 tablet by mouth once a day 3)  Diltiazem Hcl Er Beads 420 Mg Cp24 (Diltiazem hcl er beads) .... Take 1 capsule by mouth once a day 4)  Colchicine 0.6 Mg Tabs (Colchicine) .... Take 1 tablet by mouth once a day 5)  Cozaar 50 Mg Tabs (Losartan potassium) .... Take 1 tablet by mouth once a day 6)  Calcium Carbonate-vitamin D 600-400 Mg-unit Tabs (Calcium carbonate-vitamin d) .... Take 1 tablet by mouth two times a day  Other Orders: Tdap => 46yrs IM ([id number]) Admin 1st Vaccine (33295)   Patient Instructions: 1)  Please schedule a follow-up appointment with me in 2 months for Pap.  Prescriptions: CALCIUM CARBONATE-VITAMIN D 600-400 MG-UNIT TABS (CALCIUM CARBONATE-VITAMIN D) Take 1 tablet by mouth two times a day  #60 x 11   Entered and Authorized by:   Margarito Liner MD   Signed by:   Margarito Liner MD on 06/06/2010   Method used:   [facility]        [facility]  [address] [id number]       [address]       [place], [place]  [address]       Ph: [phone] or [phone]       Fax: [phone]   RxID:   [id number]  Process Orders Check Orders Results:     [facility]: Check successful Tests Sent for requisitioning (June 08, 2010 7:24 PM):     06/06/2010: [facility] -- T-CBC w/Diff [[id number]] (signed)     06/06/2010: [facility] -- T-Comprehensive Metabolic Panel [[id number]] (signed)     06/06/2010: [facility] -- T-TSH [phone] (signed)    Prevention & Chronic Care Immunizations   Influenza vaccine: Fluvax MCR  (12/06/2009)   Influenza vaccine due:  08/02/2010    Tetanus booster: 06/06/2010: Tdap   Td booster deferral: Deferred  (03/07/2010)    Pneumococcal vaccine: Pneumovax  (12/06/2009)   Pneumococcal vaccine deferral: Deferred  (06/28/2009)    H. zoster vaccine: Not documented   H. zoster vaccine deferral: Deferred  (06/28/2009)  Colorectal Screening   Hemoccult: Not documented   Hemoccult action/deferral: Not indicated  (06/28/2009)    Colonoscopy: Right hemicolectomy.  Normal appearing ileocolic anastamosis.  Narrow tortuous sigmoid colon with thickened folds and deep diverticuli, difficult to advance through the sigmoid colon.  Normal rectum. Colonoscopy by: Dr. [name]  (02/09/2008)  Other Screening   Pap smear: Specimen Adequacy: Satisfactory for  evaluation.   Interpretation/Result:Negative for intraepithelial Lesion or Malignancy.     (04/18/2004)   Pap smear action/deferral: GYN Referral  (03/07/2010)   Pap smear due: 05/2007    Mammogram: ASSESSMENT: Negative - BI-RADS 1^MM DIGITAL SCREENING  (10/16/2009)   Mammogram due: 10/13/2009    DXA bone density scan: Osteopenia. Lumbar spine T score -1.5 Left femur neck T score -1.7 Right femur neck T score of -2.0   (10/16/2009)   DXA bone density action/deferral: Ordered  (06/28/2009)   Smoking status: never  (06/06/2010)  Lipids   Total Cholesterol: 212  (03/07/2010)   Lipid panel action/deferral: Lipid Panel ordered   LDL: 126  (03/07/2010)   LDL Direct: Not documented   HDL: 69  (03/07/2010)   Triglycerides: 87  (03/07/2010)   Lipid panel due: 04/13/2013  Hypertension   Last Blood Pressure: 145 / 90  (06/06/2010)   Serum creatinine: 1.39  (03/07/2010)   Serum potassium 3.5  (03/07/2010) CMP ordered    Basic metabolic panel due: 07/18/2009    Hypertension flowsheet reviewed?: Yes   Progress toward BP goal: At goal   Hypertension comments: BP on repeat was 130/70 in the right arn sitting  Self-Management Support :   Personal Goals (by the next  clinic visit) :      Personal blood pressure goal: 140/90  (06/28/2009)   Patient will work on the following items until the next clinic visit to reach self-care goals:     Medications and monitoring: take my medicines every day, bring all of my medications to every visit  (03/07/2010)     Eating: eat foods that are low in salt, eat baked foods instead of fried foods  (06/06/2010)     Activity: join a walking program  (06/06/2010)    Hypertension self-management support: Pre-printed educational material, Resources for patients handout, Written self-care plan  (06/06/2010)   Hypertension self-care plan printed.      Resource handout printed.   Nursing Instructions: Give tetanus booster today     Immunizations Administered:  Tetanus Vaccine:    Vaccine Type: Tdap    Site: left deltoid    Mfr: [employer]    Dose: 0.5 ml    Route: IM    Given by: Cynda Familia ([facility])    Exp. Date: 02/23/2012    Lot #: [id number]    VIS given: 10/20/07 version given June 06, 2010.

## 2011-01-01 NOTE — Assessment & Plan Note (Signed)
Summary: EST-2 MONTH F/U FOR PAP/CH   Vital Signs:  Patient profile:   77 year old female Height:      59 inches Weight:      131.6 pounds BMI:     26.68 O2 Sat:      99 % on Room air Temp:     97.0 degrees F oral Pulse rate:   68 / minute BP sitting:   128 / 77  (right arm)  Vitals Entered By: Filomena Jungling NT II (August 29, 2010 11:16 AM)  O2 Flow:  Room air CC: HFU; also followup chronic medical problems Is Patient Diabetic? No Pain Assessment Patient in pain? yes     Location: knee Intensity: 3 Type: aching Onset of pain  Intermittent Nutritional Status BMI of 25 - 29 = overweight  Have you ever been in a relationship where you felt threatened, hurt or afraid?No   Does patient need assistance? Functional Status Self care Ambulation Normal   Primary Care Provider:  Margarito Liner MD  CC:  HFU; also followup chronic medical problems.  History of Present Illness: Patient returns for followup of a recent hospitalization on the internal medicine service September 18 through August 21, 2010 with GI bleeding characterized by dark blood in her stools and attributed to a probable diverticular bleed. A GI consult was obtained, the patient did not undergo colonoscopy since the bleeding is presumed to be most likely diverticular based on prior colonoscopy. Patient has done well since her discharge home, with no further episodes of rectal bleeding. She reports feeling fatigued and tired. She reports that she has been taking her medications as prescribed.  Preventive Screening-Counseling & Management  Alcohol-Tobacco     Smoking Status: never  Caffeine-Diet-Exercise     Caffeine use/day: 2     Does Patient Exercise: no  Current Medications (verified): 1)  Allopurinol 100 Mg Tabs (Allopurinol) .... Take 2 Tablets By Mouth Once A Day 2)  Famotidine 20 Mg Tabs (Famotidine) .... Take 1 Tablet By Mouth Once A Day 3)  Diltiazem Hcl Er Beads 420 Mg Cp24 (Diltiazem Hcl Er Beads)  .... Take 1 Capsule By Mouth Once A Day 4)  Colchicine 0.6 Mg Tabs (Colchicine) .... Take 1 Tablet By Mouth Once A Day 5)  Cozaar 50 Mg Tabs (Losartan Potassium) .... Take 1 Tablet By Mouth Once A Day 6)  Calcium Carbonate-Vitamin D 600-400 Mg-Unit Tabs (Calcium Carbonate-Vitamin D) .... Take 1 Tablet By Mouth Two Times A Day  Allergies (verified): No Known Drug Allergies  Review of Systems CV:  Denies chest pain or discomfort and swelling of feet. Resp:  Denies chest discomfort and shortness of breath. GI:  Denies abdominal pain, nausea, and vomiting; no blood in stools since hospital discharge.  Physical Exam  General:  alert, no distress Lungs:  normal respiratory effort, normal breath sounds, no crackles, and no wheezes.   Heart:  normal rate, regular rhythm, no murmur, no gallop, and no rub.   Abdomen:  soft, non-tender, normal bowel sounds, no hepatomegaly, and no splenomegaly.   Extremities:  no edema   Impression & Recommendations:  Problem # 1:  BLOOD IN STOOL (ICD-578.1) Patient has done well since her discharge home, with no further episodes of bloody stool. She denies any abdominal pain. The plan is to check a CBC today.  I advised patient to return if she has recurrence of GI bleeding.  She provided a telephone number for her daughter Zannie Locastro [phone]) as a contact since  her current phone is being replaced; she agreed to call and give [place] her new telephone number as soon as it is available  Problem # 2:  ANEMIA-IRON DEFICIENCY (ICD-280.9) As above, will check a CBC today.  Orders: T-CBC w/Diff ([id number])  Problem # 3:  HYPERTENSION (ICD-401.9) Patient's blood pressure is well controlled on current regimen.  Plan is to continue current antihypertensive medications.   Her updated medication list for this problem includes:    Diltiazem Hcl Er Beads 420 Mg Cp24 (Diltiazem hcl er beads) .[name]... Take 1 capsule by mouth once a day    Cozaar 50 Mg Tabs (Losartan  potassium) .[name]... Take 1 tablet by mouth once a day  BP today: 128/77 Prior BP: 145/90 (06/06/2010)  Prior 10 Yr Risk Heart Disease: 9 % (06/06/2010)  Labs Reviewed: K+: 3.9 (06/06/2010) Creat: : 1.37 (06/06/2010)   Chol: 212 (03/07/2010)   HDL: 69 (03/07/2010)   LDL: 126 (03/07/2010)   TG: 87 (03/07/2010)  Problem # 4:  OSTEOPENIA (ICD-733.90) Patient reports that she is taking her calcium with vitamin D as prescribed  Problem # 5:  Preventive Health Care (ICD-V70.0) Given recent hospitalization, patient prefers that we postpone her Pap until next visit.  Complete Medication List: 1)  Allopurinol 100 Mg Tabs (Allopurinol) .... Take 2 tablets by mouth once a day 2)  Famotidine 20 Mg Tabs (Famotidine) .... Take 1 tablet by mouth once a day 3)  Diltiazem Hcl Er Beads 420 Mg Cp24 (Diltiazem hcl er beads) .... Take 1 capsule by mouth once a day 4)  Colchicine 0.6 Mg Tabs (Colchicine) .... Take 1 tablet by mouth once a day 5)  Cozaar 50 Mg Tabs (Losartan potassium) .... Take 1 tablet by mouth once a day 6)  Calcium Carbonate-vitamin D 600-400 Mg-unit Tabs (Calcium carbonate-vitamin d) .... Take 1 tablet by mouth two times a day  Other Orders: Influenza Vaccine MCR ([id number])  Patient Instructions: 1)  Please schedule a follow-up appointment in 6 weeks. 2)  Please call today and give [place] your updated phone number.  Prevention & Chronic Care Immunizations   Influenza vaccine: Fluvax MCR  (08/29/2010)   Influenza vaccine due: 08/02/2010    Tetanus booster: 06/06/2010: Tdap   Td booster deferral: Deferred  (03/07/2010)    Pneumococcal vaccine: Pneumovax  (12/06/2009)   Pneumococcal vaccine deferral: Deferred  (06/28/2009)    H. zoster vaccine: Not documented   H. zoster vaccine deferral: Deferred  (06/28/2009)  Colorectal Screening   Hemoccult: Not documented   Hemoccult action/deferral: Deferred  (08/29/2010)    Colonoscopy: Right hemicolectomy.  Normal appearing ileocolic  anastamosis.  Narrow tortuous sigmoid colon with thickened folds and deep diverticuli, difficult to advance through the sigmoid colon.  Normal rectum. Colonoscopy by: Dr. [name]  (02/09/2008)  Other Screening   Pap smear: Specimen Adequacy: Satisfactory for evaluation.   Interpretation/Result:Negative for intraepithelial Lesion or Malignancy.     (04/18/2004)   Pap smear action/deferral: GYN Referral  (03/07/2010)   Pap smear due: 05/2007    Mammogram: ASSESSMENT: Negative - BI-RADS 1^MM DIGITAL SCREENING  (10/16/2009)   Mammogram due: 10/13/2009    DXA bone density scan: Osteopenia. Lumbar spine T score -1.5 Left femur neck T score -1.7 Right femur neck T score of -2.0   (10/16/2009)   DXA bone density action/deferral: Ordered  (06/28/2009)   Smoking status: never  (08/29/2010)  Lipids   Total Cholesterol: 212  (03/07/2010)   Lipid panel action/deferral: Lipid Panel ordered   LDL: 126  (  03/07/2010)   LDL Direct: Not documented   HDL: 69  (03/07/2010)   Triglycerides: 87  (03/07/2010)   Lipid panel due: 04/13/2013  Hypertension   Last Blood Pressure: 128 / 77  (08/29/2010)   Serum creatinine: 1.37  (06/06/2010)   Serum potassium 3.9  (06/06/2010)   Basic metabolic panel due: 07/18/2009    Hypertension flowsheet reviewed?: Yes   Progress toward BP goal: At goal  Self-Management Support :   Personal Goals (by the next clinic visit) :      Personal blood pressure goal: 140/90  (06/28/2009)   Patient will work on the following items until the next clinic visit to reach self-care goals:     Medications and monitoring: take my medicines every day, bring all of my medications to every visit  (08/29/2010)     Eating: eat more vegetables, use fresh or frozen vegetables, eat foods that are low in salt, eat baked foods instead of fried foods, eat fruit for snacks and desserts  (08/29/2010)     Activity: take a 30 minute walk every day  (08/29/2010)    Hypertension  self-management support: Education handout, Resources for patients handout, Written self-care plan  (08/29/2010)   Hypertension self-care plan printed.   Hypertension education handout printed      Resource handout printed.   Nursing Instructions: Give Flu vaccine today    Process Orders Check Orders Results:     [facility]: Check successful Tests Sent for requisitioning (August 30, 2010 4:22 PM):     08/29/2010: [facility] -- [facility] w/Diff [[id number]] (signed)      Immunizations Administered:  Influenza Vaccine # 1:    Vaccine Type: Fluvax MCR    Site: right deltoid    Mfr: GlaxoSmithKline    Dose: 0.5 ml    Route: IM    Given by: Angelina Ok RN    Exp. Date: 06/01/2011    Lot #: [id number]    VIS given: 06/26/10 version given August 29, 2010.  Flu Vaccine Consent Questions:    Do you have a history of severe allergic reactions to this vaccine? no    Any prior history of allergic reactions to egg and/or gelatin? no    Do you have a sensitivity to the preservative Thimersol? no    Do you have a past history of Guillan-Barre Syndrome? no    Do you currently have an acute febrile illness? no    Have you ever had a severe reaction to latex? no    Vaccine information given and explained to patient? yes    Are you currently pregnant? no

## 2011-01-01 NOTE — Assessment & Plan Note (Signed)
Summary: checkup   Vital Signs:  Patient profile:   77 year old female Height:      59 inches Weight:      137.8 pounds BMI:     27.93 Temp:     97.8 degrees F oral Pulse rate:   91 / minute BP sitting:   142 / 92  (right arm)  Vitals Entered By: Filomena Jungling NT II (December 06, 2009 10:33 AM) CC: CHECK-UP Is Patient Diabetic? No Pain Assessment Patient in pain? no      Nutritional Status BMI of 25 - 29 = overweight  Have you ever been in a relationship where you felt threatened, hurt or afraid?No   Does patient need assistance? Functional Status Self care Ambulation Normal   Primary Care Provider:  Margarito Liner MD  CC:  CHECK-UP.  History of Present Illness: Patient  returns today for follow-up of her hypertension and other chronic medical problems.  She has no acute complaints.  She reports that she is compliant with her medications, although she ran out of her losartan two days ago and has not yet had it refilled.  Preventive Screening-Counseling & Management  Alcohol-Tobacco     Smoking Status: never  Caffeine-Diet-Exercise     Caffeine use/day: 2     Does Patient Exercise: no  Bone Density  Procedure date:  10/16/2009  Findings:      Osteopenia. Lumbar spine T score -1.5 Left femur neck T score -1.7 Right femur neck T score of -2.0   Comments:      Assessment:  Osteopenia.     Current Medications (verified): 1)  Allopurinol 100 Mg Tabs (Allopurinol) .... Take 2 Tablets By Mouth Once A Day 2)  Famotidine 20 Mg Tabs (Famotidine) .... Take 1 Tablet By Mouth Once A Day 3)  Diltiazem Hcl Er Beads 420 Mg Cp24 (Diltiazem Hcl Er Beads) .... Take 1 Capsule By Mouth Once A Day 4)  Colchicine 0.6 Mg Tabs (Colchicine) .... Take 1 Tablet By Mouth Once A Day 5)  Cozaar 50 Mg Tabs (Losartan Potassium) .... Take 1 Tablet By Mouth Once A Day  Allergies (verified): No Known Drug Allergies  Past History:  Past Medical History: Reviewed history from 04/22/2007  and no changes required. Anemia-iron deficiency Adenomatous colonic polyps, hx of S/P right hemicolectomy for malignant polyp in 1991 Diverticulosis, colon, with history of lower GI bleed in 2005 Gout Hypertension Endometrial polyp, status post operative hysteroscopy with removal of polyp, and dilation and curettage by Dr. Antionette Char on October 20, 2000 Peptic ulcer disease: gastric ulcer 1995 Gastritis by EGD 01/31/2000 Elavated alkaline phosphatase,chronic  Review of Systems General:  Denies chills, fever, and sweats. ENT:  Chronic tinnitus.. CV:  Denies chest pain or discomfort, difficulty breathing at night, difficulty breathing while lying down, and swelling of feet. Resp:  Denies chest discomfort. GI:  Complains of constipation; denies abdominal pain, bloody stools, dark tarry stools, nausea, and vomiting. Psych:  Denies anxiety and depression.  Physical Exam  General:  alert, no distress Lungs:  normal respiratory effort, no accessory muscle use, no crackles, and no wheezes.   Heart:  normal rate, regular rhythm, no murmur, no gallop, and no rub.   Extremities:  no edema   Impression & Recommendations:  Problem # 1:  HYPERTENSION (ICD-401.9) The patient's blood pressure is mildly elevated today, but she has been out of her losartan for two days.  I advised her to refill the losartan.  Will recheck her blood  pressure upon return.  Will check a comprehensive metabolic panel today.  Her updated medication list for this problem includes:    Diltiazem Hcl Er Beads 420 Mg Cp24 (Diltiazem hcl er beads) .[name]... Take 1 capsule by mouth once a day    Cozaar 50 Mg Tabs (Losartan potassium) .[name]... Take 1 tablet by mouth once a day  BP today: 142/92 Prior BP: 142/94 (06/28/2009)  Labs Reviewed: K+: 3.6 (01/18/2009) Creat: : 1.01 (01/18/2009)   Chol: 205 (04/13/2008)   HDL: 84 (04/13/2008)   LDL: 103 (04/13/2008)   TG: 89 (04/13/2008)  Problem # 2:  OSTEOPENIA  (ICD-733.90) Plan is to start calcium and vitamin D supplementation, and check a vitamin D level.  Given current guidelines and her FRAX risk estimate, will not start a bisphosphonate at this time.  Orders: T-Vitamin D (25-Hydroxy) [phone])  Problem # 3:  GOUT (ICD-274.9) Symptoms are well controlled on current regimen.  Will continue as below.  Her updated medication list for this problem includes:    Allopurinol 100 Mg Tabs (Allopurinol) .[name]... Take 2 tablets by mouth once a day    Colchicine 0.6 Mg Tabs (Colchicine) .[name]... Take 1 tablet by mouth once a day  Problem # 4:  Preventive Health Care (ICD-V70.0) A flu shot and Pneumovax were given today.  Complete Medication List: 1)  Allopurinol 100 Mg Tabs (Allopurinol) .... Take 2 tablets by mouth once a day 2)  Famotidine 20 Mg Tabs (Famotidine) .... Take 1 tablet by mouth once a day 3)  Diltiazem Hcl Er Beads 420 Mg Cp24 (Diltiazem hcl er beads) .... Take 1 capsule by mouth once a day 4)  Colchicine 0.6 Mg Tabs (Colchicine) .... Take 1 tablet by mouth once a day 5)  Cozaar 50 Mg Tabs (Losartan potassium) .... Take 1 tablet by mouth once a day 6)  Calcium Carbonate-vitamin D 600-400 Mg-unit Tabs (Calcium carbonate-vitamin d) .... Take 1 tablet by mouth two times a day  Other Orders: T-Comprehensive Metabolic Panel ([id number]) Influenza Vaccine MCR ([id number]) Pneumococcal Vaccine (13086) Admin 1st Vaccine ([id number])  Patient Instructions: 1)  Please schedule a follow-up appointment in 3 months. 2)  Start calcium with vitamin D as prescribed 1 tablet two times a day.  Prevention & Chronic Care Immunizations   Influenza vaccine: Fluvax MCR  (12/06/2009)    Tetanus booster: Not documented   Td booster deferral: Deferred  (06/28/2009)    Pneumococcal vaccine: Pneumovax  (12/06/2009)   Pneumococcal vaccine deferral: Deferred  (06/28/2009)    H. zoster vaccine: Not documented   H. zoster vaccine deferral: Deferred   (06/28/2009)  Colorectal Screening   Hemoccult: Not documented   Hemoccult action/deferral: Not indicated  (06/28/2009)    Colonoscopy: Right hemicolectomy.  Normal appearing ileocolic anastamosis.  Narrow tortuous sigmoid colon with thickened folds and deep diverticuli, difficult to advance through the sigmoid colon.  Normal rectum. Colonoscopy by: Dr. [name]  (02/09/2008)  Other Screening   Pap smear: Specimen Adequacy: Satisfactory for evaluation.   Interpretation/Result:Negative for intraepithelial Lesion or Malignancy.     (04/18/2004)   Pap smear action/deferral: GYN Referral  (06/28/2009)   Pap smear due: 05/2007    Mammogram: ASSESSMENT: Negative - BI-RADS 1^MM DIGITAL SCREENING  (10/16/2009)   Mammogram due: 10/13/2009    DXA bone density scan: Osteopenia. Lumbar spine T score -1.5 Left femur neck T score -1.7 Right femur neck T score of -2.0   (10/16/2009)   DXA bone density action/deferral: Ordered  (06/28/2009)  Reports requested:  Last Pap report requested.  Smoking status: never  (12/06/2009)  Lipids   Total Cholesterol: 205  (04/13/2008)   LDL: 103  (04/13/2008)   LDL Direct: Not documented   HDL: 84  (04/13/2008)   Triglycerides: 89  (04/13/2008)   Lipid panel due: 04/13/2013  Hypertension   Last Blood Pressure: 142 / 92  (12/06/2009)   Serum creatinine: 1.01  (01/18/2009)   Serum potassium 3.6  (01/18/2009) CMP ordered    Basic metabolic panel due: 07/18/2009    Hypertension flowsheet reviewed?: Yes   Progress toward BP goal: Unchanged  Self-Management Support :   Personal Goals (by the next clinic visit) :      Personal blood pressure goal: 140/90  (06/28/2009)   Patient will work on the following items until the next clinic visit to reach self-care goals:     Medications and monitoring: take my medicines every day  (12/06/2009)     Eating: eat more vegetables, eat foods that are low in salt, eat baked foods instead of fried foods   (12/06/2009)    Hypertension self-management support: Written self-care plan  (12/06/2009)   Hypertension self-care plan printed.   Nursing Instructions: Give Flu vaccine today Give Pneumovax today Request report of last Pap    Process Orders Check Orders Results:     [facility]: Check successful Tests Sent for requisitioning (December 09, 2009 12:26 PM):     12/06/2009: [facility] -- T-Comprehensive Metabolic Panel [[id number]] (signed)     12/06/2009: [facility] -- T-Vitamin D (25-Hydroxy) [phone] (signed)     Immunizations Administered:  Influenza Vaccine # 1:    Vaccine Type: Fluvax MCR    Site: left deltoid    Mfr: Novartis    Dose: 0.5 ml    Route: IM    Given by: Angelina Ok RN    Exp. Date: 03/2010    Lot #: [id number]    VIS given: 06/25/07 version given December 06, 2009.  Pneumonia Vaccine:    Vaccine Type: Pneumovax    Site: right deltoid    Mfr: Merck    Dose: 0.5 ml    Route: IM    Given by: Angelina Ok RN    Exp. Date: 12/28/2010    Lot #: [id number]    VIS given: 06/29/96 version given December 06, 2009.  Flu Vaccine Consent Questions:    Do you have a history of severe allergic reactions to this vaccine? no    Any prior history of allergic reactions to egg and/or gelatin? no    Do you have a sensitivity to the preservative Thimersol? no    Do you have a past history of Guillan-Barre Syndrome? no    Do you currently have an acute febrile illness? no    Have you ever had a severe reaction to latex? no    Vaccine information given and explained to patient? yes    Are you currently pregnant? no

## 2011-01-01 NOTE — Progress Notes (Signed)
Summary: PREVENTIVE COLONOSCOPY  Phone Note Outgoing Call   Summary of Call: Found last Colonoscopy performed on 02/09/2008 by Dr. [name] located in the EMR. Initial call taken by: Shon Hough,  October 09, 2010 2:31 PM

## 2011-01-01 NOTE — Miscellaneous (Signed)
Summary: Hospital Admission  INTERNAL MEDICINE ADMISSION HISTORY AND PHYSICAL ***Place in Progress Notes section of chart*** Attending: Dr. Josem Kaufmann 1st contact: Dr. [phone] 2nd contact: Dr. Eben Burow [phone] Weekends, holidays, after 5pm weekdays 1st contact: [phone] 2nd contact: [phone]  PCP: Dr. Meredith Pel  CC: GI Bleed  HPI: This is 77 y/o female with PMH consistent Adenomatous colonic polyps s/p right hemicolectomy, Diverticulosis, Gastritis, Anemia,  HTN with who presents with a chief complaint of dark blood per rectum. The history was provided by the patient. Pt reports having two bloody BM since yesterday. She described having some lower abdominal pain in yesterday morning and then an urge to defecate followed by passing stool with  dark blood.  There was another episode last evenig. Denies history of recent GI bleeding. Denies any dark stool, blood in the urine, nausea, vomiting, abdominal pain, chest pain, dizziness.  ALLERGIES: NKDA  PAST MEDICAL HISTORY: Anemia-iron deficiency Adenomatous colonic polyps, hx of S/P right hemicolectomy for malignant polyp in 1991 Diverticulosis, colon, with history of lower GI bleed in 2005 Gout Hypertension Endometrial polyp, status post operative hysteroscopy with removal of polyp, and dilation and curettage by Dr. Antionette Char on October 20, 2000 Peptic ulcer disease: gastric ulcer 1995 Gastritis by EGD 01/31/2000 Elavated alkaline phosphatase,chronic Chronic renal insufficiency  baseline Crea 1.3  MEDICATIONS: ALLOPURINOL 100 MG TABS (ALLOPURINOL) Take 2 tablets by mouth once a day FAMOTIDINE 20 MG TABS (FAMOTIDINE) Take 1 tablet by mouth once a day DILTIAZEM HCL ER BEADS 420 MG CP24 (DILTIAZEM HCL ER BEADS) Take 1 capsule by mouth once a day COLCHICINE 0.6 MG TABS (COLCHICINE) Take 1 tablet by mouth once a day COZAAR 50 MG TABS (LOSARTAN POTASSIUM) Take 1 tablet by mouth once a day CALCIUM CARBONATE-VITAMIN D 600-400 MG-UNIT  TABS (CALCIUM CARBONATE-VITAMIN D) Take 1 tablet by mouth two times a day   SOCIAL HISTORY:  Retired Single   FAMILY HISTORY  Maternal aunt had breast cancer. No FH colon cancer.   ROS: as per HPI, all other systems reviewed and negative   VITALS: T:98.2  P: 77 BP:154/94  R:20  O2SAT: 100% ON:RA  PHYSICAL EXAM:  Gen: Patient is in NAD, Pleasant. Eyes: PERRL, EOMI, No signs of anemia or jaundince. ENT: MMM, OP clear, No erythema, thrush or exudates. Neck: Supple, No carotid Bruits, No JVD, No thyromegaly Resp: CTA- Bilaterally, No W/C/R. CVS: S1S2 RRR, No M/R/G GI: Abdomen is soft. ND, NT, NG, NR, BS+. No organomegaly. Ext: No pedal edema, cyanosis or clubbing. Skin: No visible rashes, scars. Tugor decreased.  Lymph: No palpable lymphadenopathy. MS: Moving all 4 extremities. Neuro: A&O X3, CN II - XII are grossly intact. Motor strength is 5/5 in the all 4 extremities, Sensations intact to light touch, Gait normal, Cerebellar signs negative. Psych: Appropriate    LABS: WBC                                      4.7               4.0-10.5         K/uL  RBC                                      3.27       l      3.87-5.11  MIL/uL  Hemoglobin (HGB)                         9.8        l      12.0-15.0        g/dL  Hematocrit (HCT)                         29.0       l      36.0-46.0        %  MCV                                      88.7              78.0-100.0       fL  MCH -                                    30.0              26.0-34.0        pg  MCHC                                     33.8              30.0-36.0        g/dL  RDW                                      14.6              11.5-15.5        %  Platelet Count (PLT)                     185               150-400          K/uL  Neutrophils, %                           59                43-77            %  Lymphocytes, %                           27                12-46            %  Monocytes, %                              13         h      3-12             %  Eosinophils, %  1                 0-5              %  Basophils, %                             0                 0-1              %  Neutrophils, Absolute                    2.8               1.7-7.7          K/uL  Lymphocytes, Absolute                    1.3               0.7-4.0          K/uL  Monocytes, Absolute                      0.6               0.1-1.0          K/uL  Eosinophils, Absolute                    0.0               0.0-0.7          K/uL  Basophils, Absolute                      0.0               0.0-0.1          K/uL   Protime ( Prothrombin Time)              13.4              11.6-15.2        seconds  INR                                      1.00              0.00-1.49   Sodium (NA)                              137               135-145          mEq/L  Potassium (K)                            3.4        l      3.5-5.1          mEq/L  Chloride                                 103               96-112  mEq/L  CO2                                      25                19-32            mEq/L  Glucose                                  102        h      70-99            mg/dL  BUN                                      25         h      6-23             mg/dL  Creatinine                               1.48       h      0.4-1.2          mg/dL  GFR, Est Non African American            34         l      >60              mL/min  GFR, Est African American                41         l      >60              mL/min    Oversized comment, see footnote  1  Bilirubin, Total                         0.4               0.3-1.2          mg/dL  Alkaline Phosphatase                     98                39-117           U/L  SGOT (AST)                               29                0-37             U/L  SGPT (ALT)                               16                0-35             U/L  Total  Protein  6.5               6.0-8.3          g/dL    Albumin-Blood                            3.9               3.5-5.2          g/dL  Calcium                                  8.8               8.4-10.5         mg/dL   Occult Blood, Fecal                      POSITIVE  ASSESSMENT AND PLAN: 1. GI Bleed Pt has mutliple possible soures for GI bleed including hx of polyps and diverticulosis, other possibilities include AVM, malignancy, etc. - Will admit to tele - Place 2 large bore IVs - Type and screen for 2 units pRBCs - CBCs q 8 - WIll consult GI for further evaluation   2. HTN:  Continue home medications  3. CRI: Patient's crea is slightly elevated above her baseline most likely pre-renal 2/2 to GI bleed. Will repeat Bmet after IVF. Consider Urin Na+, Urin Crea if not improved on IVF.   4. VTE PROPH: SCDs Clinical Lists Changes

## 2011-01-01 NOTE — Assessment & Plan Note (Signed)
Summary: est-ck/fu/meds/cfb   Vital Signs:  Patient profile:   77 year old female Height:      59 inches Weight:      136.09 pounds BMI:     27.59 Temp:     97.6 degrees F oral Pulse rate:   70 / minute BP sitting:   140 / 83  (right arm)  Vitals Entered By: Filomena Jungling NT II (March 07, 2010 11:15 AM) CC: checkup/ bilateral pains in arms feels like a catch Is Patient Diabetic? No Pain Assessment Patient in pain? yes     Location: arms Intensity: 8 Type: aching Onset of pain  1 month Nutritional Status BMI of 25 - 29 = overweight  Have you ever been in a relationship where you felt threatened, hurt or afraid?No   Does patient need assistance? Functional Status Self care Ambulation Normal   Primary Care Provider:  Margarito Liner MD  CC:  checkup/ bilateral pains in arms feels like a catch.  History of Present Illness: Patient returns for followup of her hypertension, gout, and other chronic medical problems. She has no acute complaints today. She reports that she is compliant with her medications. She has occasional episodes of left shoulder pain that are aggravated by reaching, but no pain today.  She has had no recent generalized joint pain to suggest an acute gout flare.  Preventive Screening-Counseling & Management  Alcohol-Tobacco     Smoking Status: never  Caffeine-Diet-Exercise     Caffeine use/day: 2     Does Patient Exercise: no  Current Medications (verified): 1)  Allopurinol 100 Mg Tabs (Allopurinol) .... Take 2 Tablets By Mouth Once A Day 2)  Famotidine 20 Mg Tabs (Famotidine) .... Take 1 Tablet By Mouth Once A Day 3)  Diltiazem Hcl Er Beads 420 Mg Cp24 (Diltiazem Hcl Er Beads) .... Take 1 Capsule By Mouth Once A Day 4)  Colchicine 0.6 Mg Tabs (Colchicine) .... Take 1 Tablet By Mouth Once A Day 5)  Cozaar 50 Mg Tabs (Losartan Potassium) .... Take 1 Tablet By Mouth Once A Day 6)  Calcium Carbonate-Vitamin D 600-400 Mg-Unit Tabs (Calcium Carbonate-Vitamin  D) .... Take 1 Tablet By Mouth Two Times A Day 7)  D3-50 50000 Unit Caps (Cholecalciferol) .... Take 1 Capsule By Mouth Once A Week For 6 Weeks  Allergies (verified): No Known Drug Allergies  Review of Systems General:  Denies chills and fever. CV:  Denies chest pain or discomfort; she reports occasional swelling of feet after standing for a while, this resolves when she elevates her legs.[name] Resp:  Denies shortness of breath. GI:  Denies abdominal pain, bloody stools, and dark tarry stools; she reports one episode of nausea last week which resolved and has not recurred. MS:  No generalized joint pain/inflammation.  Physical Exam  General:  alert, no distress Lungs:  normal respiratory effort, no accessory muscle use, no crackles, and no wheezes.   Heart:  normal rate, regular rhythm, no murmur, no gallop, and no rub.   Abdomen:  soft, non-tender, normal bowel sounds, no hepatomegaly, and no splenomegaly.   Extremities:  no edema   Impression & Recommendations:  Problem # 1:  HYPERTENSION (ICD-401.9) Patient's blood pressure is at goal on current regimen; the plan is to continue current medications, and check a basic metabolic panel today.  Her updated medication list for this problem includes:    Diltiazem Hcl Er Beads 420 Mg Cp24 (Diltiazem hcl er beads) .[name]... Take 1 capsule by mouth once  a day    Cozaar 50 Mg Tabs (Losartan potassium) .[name]... Take 1 tablet by mouth once a day  BP today: 140/83 Prior BP: 142/92 (12/06/2009)  Labs Reviewed: K+: 4.3 (12/07/2009) Creat: : 1.29 (12/07/2009)   Chol: 205 (04/13/2008)   HDL: 84 (04/13/2008)   LDL: 103 (04/13/2008)   TG: 89 (04/13/2008)  Orders: T-Basic Metabolic Panel [phone])  Problem # 2:  OSTEOPENIA (ICD-733.90) I advised patient to start taking calcium with vitamin D now that she has completed her 6 weeks of higher dose vitamin D supplementation.  Problem # 3:  VITAMIN D DEFICIENCY (ICD-268.9) Patient has completed 6  weeks of high-dose vitamin D supplementation; will recheck a level at next visit. I advised her to start taking the calcium with vitamin D supplementation as noted above.  Problem # 4:  GOUT (ICD-274.9) Patient is doing well with no recent gout flares on her current regimen. Plan is to continue current medications.  Her updated medication list for this problem includes:    Allopurinol 100 Mg Tabs (Allopurinol) .[name]... Take 2 tablets by mouth once a day    Colchicine 0.6 Mg Tabs (Colchicine) .[name]... Take 1 tablet by mouth once a day  Complete Medication List: 1)  Allopurinol 100 Mg Tabs (Allopurinol) .... Take 2 tablets by mouth once a day 2)  Famotidine 20 Mg Tabs (Famotidine) .... Take 1 tablet by mouth once a day 3)  Diltiazem Hcl Er Beads 420 Mg Cp24 (Diltiazem hcl er beads) .... Take 1 capsule by mouth once a day 4)  Colchicine 0.6 Mg Tabs (Colchicine) .... Take 1 tablet by mouth once a day 5)  Cozaar 50 Mg Tabs (Losartan potassium) .... Take 1 tablet by mouth once a day 6)  Calcium Carbonate-vitamin D 600-400 Mg-unit Tabs (Calcium carbonate-vitamin d) .... Take 1 tablet by mouth two times a day 7)  D3-50 50000 Unit Caps (Cholecalciferol) .... Take 1 capsule by mouth once a week for 6 weeks  Other Orders: T-Lipid Profile ([id number]) Gynecologic Referral (Gyn)  Patient Instructions: 1)  Please schedule a follow-up appointment in 3 months. 2)  Please keep appointment for Pap smear.  Prevention & Chronic Care Immunizations   Influenza vaccine: Fluvax MCR  (12/06/2009)    Tetanus booster: Not documented   Td booster deferral: Deferred  (03/07/2010)    Pneumococcal vaccine: Pneumovax  (12/06/2009)   Pneumococcal vaccine deferral: Deferred  (06/28/2009)    H. zoster vaccine: Not documented   H. zoster vaccine deferral: Deferred  (06/28/2009)    Immunization comments: I discussed tetanus booster; patient wants to consider this, but did not want to have it done today.  Colorectal  Screening   Hemoccult: Not documented   Hemoccult action/deferral: Not indicated  (06/28/2009)    Colonoscopy: Right hemicolectomy.  Normal appearing ileocolic anastamosis.  Narrow tortuous sigmoid colon with thickened folds and deep diverticuli, difficult to advance through the sigmoid colon.  Normal rectum. Colonoscopy by: Dr. [name]  (02/09/2008)  Other Screening   Pap smear: Specimen Adequacy: Satisfactory for evaluation.   Interpretation/Result:Negative for intraepithelial Lesion or Malignancy.     (04/18/2004)   Pap smear action/deferral: GYN Referral  (03/07/2010)   Pap smear due: 05/2007    Mammogram: ASSESSMENT: Negative - BI-RADS 1^MM DIGITAL SCREENING  (10/16/2009)   Mammogram due: 10/13/2009    DXA bone density scan: Osteopenia. Lumbar spine T score -1.5 Left femur neck T score -1.7 Right femur neck T score of -2.0   (10/16/2009)   DXA bone density  action/deferral: Ordered  (06/28/2009)   Smoking status: never  (03/07/2010)  Lipids   Total Cholesterol: 205  (04/13/2008)   Lipid panel action/deferral: Lipid Panel ordered   LDL: 103  (04/13/2008)   LDL Direct: Not documented   HDL: 84  (04/13/2008)   Triglycerides: 89  (04/13/2008)   Lipid panel due: 04/13/2013  Hypertension   Last Blood Pressure: 140 / 83  (03/07/2010)   Serum creatinine: 1.29  (12/07/2009)   Serum potassium 4.3  (12/07/2009)   Basic metabolic panel due: 07/18/2009    Hypertension flowsheet reviewed?: Yes   Progress toward BP goal: At goal  Self-Management Support :   Personal Goals (by the next clinic visit) :      Personal blood pressure goal: 140/90  (06/28/2009)   Patient will work on the following items until the next clinic visit to reach self-care goals:     Medications and monitoring: take my medicines every day, bring all of my medications to every visit  (03/07/2010)     Eating: eat more vegetables, use fresh or frozen vegetables, eat foods that are low in salt, eat baked  foods instead of fried foods  (03/07/2010)    Hypertension self-management support: Education handout, Resources for patients handout, Written self-care plan  (03/07/2010)   Hypertension self-care plan printed.   Hypertension education handout printed      Resource handout printed.   Nursing Instructions: Gyn referral for screening Pap (see order)   Process Orders Check Orders Results:     [facility]: Check successful Tests Sent for requisitioning (March 08, 2010 9:59 AM):     03/07/2010: [facility] -- T-Lipid Profile [id number] (signed)     03/07/2010: [facility] -- T-Basic Metabolic Panel [phone] (signed)

## 2011-01-01 NOTE — Discharge Summary (Signed)
Summary: Hospital Discharge Update    Hospital Discharge Update:  Date of Admission: 08/19/2010 Date of Discharge: 08/21/2010  Brief Summary:  Pt is a 77 y/o woman with PMH diverticulosis, gastritis, anemia, and adenomatous colonic polyps s/p right hemicolectomy in 1991 who was admitted for GI bleed. GI consulted and did not rec scope at this time because last colonoscopy was in 2009 and wnl. Bleed thought to be 2/2 diverticulosis.  Resolved spontaneously and pt did not require transfusion.  Pt's baselind Hgb  ~12 and on discharge was stable for 36 hours  ~8. On discharge Hgb was 7.9.  Pt was orthostatic on day prior to discharge but at no time was she symptomatic. Anemia panel checked and wnl, however this bleed will likely stress her iron stores. Pt is to follow-up at her reg scheduled appointment with Dr. Meredith Pel on Sept 28 at 11am with Dr. Meredith Pel. She will need a CBC at that time  Other labs needed at follow-up: CBC  Problem list changes:  Added new problem of MELENA (ICD-578.1)  The medication, problem, and allergy lists have been updated.  Please see the dictated discharge summary for details.  Discharge medications:  ALLOPURINOL 100 MG TABS (ALLOPURINOL) Take 2 tablets by mouth once a day FAMOTIDINE 20 MG TABS (FAMOTIDINE) Take 1 tablet by mouth once a day DILTIAZEM HCL ER BEADS 420 MG CP24 (DILTIAZEM HCL ER BEADS) Take 1 capsule by mouth once a day COLCHICINE 0.6 MG TABS (COLCHICINE) Take 1 tablet by mouth once a day COZAAR 50 MG TABS (LOSARTAN POTASSIUM) Take 1 tablet by mouth once a day CALCIUM CARBONATE-VITAMIN D 600-400 MG-UNIT TABS (CALCIUM CARBONATE-VITAMIN D) Take 1 tablet by mouth two times a day  Other patient instructions:  Please follow-up with Dr. Meredith Pel on Sept 28 at 11am at the outpatient clinic.  [phone] Please take your medicines as directed. You may resume your regular activities and diet. If you experience any more bleeding from your bottom, please come to the  clinic or go to the emergency room.  Note: Hospital Discharge Medications & Other Instructions handout was printed, one copy for patient and a second copy to be placed in hospital chart.

## 2011-01-01 NOTE — Progress Notes (Signed)
----   Converted from flag ---- ---- 12/14/2009 9:07 AM, Mamie Hague NT II wrote: patient has now missed 6 appointments that were made for her, i talked with patient on last vivit, patient does not want to have papsmear. mh  ---- 12/09/2009 12:24 PM, Margarito Liner MD wrote: Please request a report of patient's last Pap smear.  Thanks, Dorene Sorrow ------------------------------

## 2011-01-02 ENCOUNTER — Ambulatory Visit: Payer: Self-pay | Admitting: Internal Medicine

## 2011-01-02 ENCOUNTER — Ambulatory Visit: Admit: 2011-01-02 | Payer: Self-pay | Admitting: Internal Medicine

## 2011-02-08 ENCOUNTER — Encounter: Payer: Self-pay | Admitting: Internal Medicine

## 2011-02-12 ENCOUNTER — Ambulatory Visit (INDEPENDENT_AMBULATORY_CARE_PROVIDER_SITE_OTHER): Payer: Medicare Other | Admitting: Internal Medicine

## 2011-02-12 ENCOUNTER — Encounter: Payer: Self-pay | Admitting: Internal Medicine

## 2011-02-12 VITALS — BP 134/76 | HR 84 | Temp 97.0°F | Wt 128.0 lb

## 2011-02-12 DIAGNOSIS — N259 Disorder resulting from impaired renal tubular function, unspecified: Secondary | ICD-10-CM

## 2011-02-12 DIAGNOSIS — I10 Essential (primary) hypertension: Secondary | ICD-10-CM

## 2011-02-12 DIAGNOSIS — M109 Gout, unspecified: Secondary | ICD-10-CM

## 2011-02-12 DIAGNOSIS — K5900 Constipation, unspecified: Secondary | ICD-10-CM

## 2011-02-12 DIAGNOSIS — D509 Iron deficiency anemia, unspecified: Secondary | ICD-10-CM

## 2011-02-12 DIAGNOSIS — M899 Disorder of bone, unspecified: Secondary | ICD-10-CM

## 2011-02-12 DIAGNOSIS — M949 Disorder of cartilage, unspecified: Secondary | ICD-10-CM

## 2011-02-12 LAB — COMPREHENSIVE METABOLIC PANEL
ALT: 16 U/L (ref 0–35)
AST: 26 U/L (ref 0–37)
Albumin: 4.5 g/dL (ref 3.5–5.2)
Alkaline Phosphatase: 112 U/L (ref 39–117)
BUN: 24 mg/dL — ABNORMAL HIGH (ref 6–23)
CO2: 26 mEq/L (ref 19–32)
Calcium: 9.5 mg/dL (ref 8.4–10.5)
Chloride: 100 mEq/L (ref 96–112)
Creat: 1.52 mg/dL — ABNORMAL HIGH (ref 0.40–1.20)
Glucose, Bld: 88 mg/dL (ref 70–99)
Potassium: 4.4 mEq/L (ref 3.5–5.3)
Sodium: 139 mEq/L (ref 135–145)
Total Bilirubin: 0.4 mg/dL (ref 0.3–1.2)
Total Protein: 7.2 g/dL (ref 6.0–8.3)

## 2011-02-12 LAB — CBC WITH DIFFERENTIAL/PLATELET
Basophils Absolute: 0 10*3/uL (ref 0.0–0.1)
Basophils Relative: 0 % (ref 0–1)
Eosinophils Absolute: 0 10*3/uL (ref 0.0–0.7)
Eosinophils Relative: 0 % (ref 0–5)
HCT: 33.4 % — ABNORMAL LOW (ref 36.0–46.0)
Hemoglobin: 10.9 g/dL — ABNORMAL LOW (ref 12.0–15.0)
Lymphocytes Relative: 33 % (ref 12–46)
Lymphs Abs: 2.2 10*3/uL (ref 0.7–4.0)
MCH: 28.9 pg (ref 26.0–34.0)
MCHC: 32.6 g/dL (ref 30.0–36.0)
MCV: 88.6 fL (ref 78.0–100.0)
Monocytes Absolute: 0.8 10*3/uL (ref 0.1–1.0)
Monocytes Relative: 13 % — ABNORMAL HIGH (ref 3–12)
Neutro Abs: 3.6 10*3/uL (ref 1.7–7.7)
Neutrophils Relative %: 54 % (ref 43–77)
Platelets: 281 10*3/uL (ref 150–400)
RBC: 3.77 MIL/uL — ABNORMAL LOW (ref 3.87–5.11)
RDW: 16.5 % — ABNORMAL HIGH (ref 11.5–15.5)
WBC: 6.7 10*3/uL (ref 4.0–10.5)

## 2011-02-12 MED ORDER — PSYLLIUM 28.3 % PO POWD: ORAL | Status: DC

## 2011-02-12 MED ORDER — DOCUSATE SODIUM 100 MG PO CAPS: 100.0000 mg | ORAL_CAPSULE | Freq: Every day | ORAL | Status: DC

## 2011-02-12 NOTE — Progress Notes (Signed)
  Subjective:    Patient ID: [name], female    DOB: 1926/05/19, 77 y.o.   MRN: [id number]  HPI Patient returns for followup of her hypertension, anemia, renal insufficiency, and other chronic medical problems. Her main complaint today is constipation, which she says began after she started taking her calcium supplement. Otherwise she is doing well without any acute complaints. She reports that she is compliant with her medications. She has had no recent gout attacks. She has had no abdominal pain, and has not noted any blood in her stools. Overall she is doing well.   Review of Systems  Constitutional: Negative for appetite change.  Respiratory: Negative for shortness of breath.   Cardiovascular: Negative for chest pain.  Gastrointestinal: Positive for constipation. Negative for nausea and abdominal pain.  Musculoskeletal: Negative for joint swelling.       Objective:   Physical Exam  Cardiovascular: Normal rate, regular rhythm and normal heart sounds.  Exam reveals no gallop and no friction rub.   No murmur heard. Pulmonary/Chest: Effort normal and breath sounds normal. She has no wheezes. She has no rales.  Abdominal: Soft. Bowel sounds are normal. She exhibits no distension. There is no tenderness.  Musculoskeletal: She exhibits no edema.          Assessment & Plan:

## 2011-02-12 NOTE — Assessment & Plan Note (Signed)
  BP Readings from Last 3 Encounters:  02/12/11 134/76  10/10/10 125/83  08/29/10 128/77    Assessment: Hypertension control:  controlled  Progress toward goals:  at goal Barriers to meeting goals:  no barriers identified  Plan: Hypertension treatment:  continue current medications

## 2011-02-12 NOTE — Assessment & Plan Note (Signed)
The plan is to treat with Colace and Metamucil as needed; I advised patient to let me know if her symptoms persist

## 2011-02-12 NOTE — Assessment & Plan Note (Signed)
Patient has had no recent gout symptoms. The plan is to continue allopurinol and colchicine.

## 2011-02-12 NOTE — Assessment & Plan Note (Signed)
Will check a metabolic panel today.

## 2011-02-12 NOTE — Assessment & Plan Note (Signed)
Plan is to check a CBC.

## 2011-02-12 NOTE — Assessment & Plan Note (Signed)
Patient will continue calcium with vitamin D supplementation. Given her constipation, I encouraged her to take Colace as previously prescribed and also added Metamucil for constipation.

## 2011-02-12 NOTE — Patient Instructions (Signed)
Start colace 100 mg one capsule daily as a stool softener. Start metamucil 1 tablespoon in 8 ounces of liquid 1 to 3 times daily for constipation.

## 2011-02-13 ENCOUNTER — Ambulatory Visit: Payer: Self-pay | Admitting: Internal Medicine

## 2011-02-14 LAB — CBC
HCT: 22.9 % — ABNORMAL LOW (ref 36.0–46.0)
HCT: 22.9 % — ABNORMAL LOW (ref 36.0–46.0)
HCT: 23.3 % — ABNORMAL LOW (ref 36.0–46.0)
HCT: 24 % — ABNORMAL LOW (ref 36.0–46.0)
HCT: 24.7 % — ABNORMAL LOW (ref 36.0–46.0)
HCT: 27.8 % — ABNORMAL LOW (ref 36.0–46.0)
Hemoglobin: 7.7 g/dL — ABNORMAL LOW (ref 12.0–15.0)
Hemoglobin: 7.9 g/dL — ABNORMAL LOW (ref 12.0–15.0)
Hemoglobin: 8.2 g/dL — ABNORMAL LOW (ref 12.0–15.0)
MCH: 29.9 pg (ref 26.0–34.0)
MCH: 30 pg (ref 26.0–34.0)
MCH: 30.2 pg (ref 26.0–34.0)
MCH: 30.6 pg (ref 26.0–34.0)
MCH: 30.7 pg (ref 26.0–34.0)
MCHC: 33.8 g/dL (ref 30.0–36.0)
MCHC: 33.9 g/dL (ref 30.0–36.0)
MCV: 88.7 fL (ref 78.0–100.0)
MCV: 89.6 fL (ref 78.0–100.0)
MCV: 90.5 fL (ref 78.0–100.0)
Platelets: 154 10*3/uL (ref 150–400)
Platelets: 154 10*3/uL (ref 150–400)
Platelets: 160 10*3/uL (ref 150–400)
Platelets: 185 10*3/uL (ref 150–400)
RBC: 2.55 MIL/uL — ABNORMAL LOW (ref 3.87–5.11)
RBC: 2.73 MIL/uL — ABNORMAL LOW (ref 3.87–5.11)
RBC: 2.74 MIL/uL — ABNORMAL LOW (ref 3.87–5.11)
RDW: 14.6 % (ref 11.5–15.5)
RDW: 14.8 % (ref 11.5–15.5)
RDW: 14.8 % (ref 11.5–15.5)
RDW: 14.8 % (ref 11.5–15.5)
RDW: 14.8 % (ref 11.5–15.5)
RDW: 14.9 % (ref 11.5–15.5)
WBC: 3.8 10*3/uL — ABNORMAL LOW (ref 4.0–10.5)
WBC: 4.1 10*3/uL (ref 4.0–10.5)
WBC: 4.7 10*3/uL (ref 4.0–10.5)
WBC: 4.9 10*3/uL (ref 4.0–10.5)

## 2011-02-14 LAB — BASIC METABOLIC PANEL
BUN: 22 mg/dL (ref 6–23)
Calcium: 8.8 mg/dL (ref 8.4–10.5)
Creatinine, Ser: 1.39 mg/dL — ABNORMAL HIGH (ref 0.4–1.2)
GFR calc Af Amer: 44 mL/min — ABNORMAL LOW (ref >60)
GFR calc Af Amer: 53 mL/min — ABNORMAL LOW (ref >60)
GFR calc non Af Amer: 36 mL/min — ABNORMAL LOW (ref >60)
GFR calc non Af Amer: 44 mL/min — ABNORMAL LOW (ref >60)
Glucose, Bld: 66 mg/dL — ABNORMAL LOW (ref 70–99)
Potassium: 3.8 mEq/L (ref 3.5–5.1)
Sodium: 142 mEq/L (ref 135–145)

## 2011-02-14 LAB — DIFFERENTIAL
Basophils Relative: 0 % (ref 0–1)
Lymphs Abs: 1.3 10*3/uL (ref 0.7–4.0)
Monocytes Absolute: 0.6 10*3/uL (ref 0.1–1.0)
Monocytes Relative: 13 % — ABNORMAL HIGH (ref 3–12)
Neutro Abs: 2.8 10*3/uL (ref 1.7–7.7)

## 2011-02-14 LAB — CROSSMATCH
ABO/RH(D): AB POS
Antibody Screen: NEGATIVE

## 2011-02-14 LAB — RETICULOCYTES: Retic Ct Pct: 1 % (ref 0.4–3.1)

## 2011-02-14 LAB — COMPREHENSIVE METABOLIC PANEL
Albumin: 3.9 g/dL (ref 3.5–5.2)
Alkaline Phosphatase: 98 U/L (ref 39–117)
BUN: 25 mg/dL — ABNORMAL HIGH (ref 6–23)
Potassium: 3.4 mEq/L — ABNORMAL LOW (ref 3.5–5.1)
Sodium: 137 mEq/L (ref 135–145)
Total Protein: 6.5 g/dL (ref 6.0–8.3)

## 2011-02-14 LAB — FOLATE: Folate: >20 ng/mL

## 2011-02-14 LAB — IRON AND TIBC
Saturation Ratios: 24 % (ref 20–55)
TIBC: 253 ug/dL (ref 250–470)
UIBC: 192 ug/dL

## 2011-02-14 LAB — ABO/RH: ABO/RH(D): AB POS

## 2011-02-14 LAB — HEMOCCULT GUIAC POC 1CARD (OFFICE): Fecal Occult Bld: POSITIVE

## 2011-03-10 ENCOUNTER — Other Ambulatory Visit: Payer: Self-pay | Admitting: Internal Medicine

## 2011-03-28 ENCOUNTER — Other Ambulatory Visit (INDEPENDENT_AMBULATORY_CARE_PROVIDER_SITE_OTHER): Payer: Medicare Other | Admitting: *Deleted

## 2011-03-28 DIAGNOSIS — I10 Essential (primary) hypertension: Secondary | ICD-10-CM

## 2011-03-28 MED ORDER — ALLOPURINOL 100 MG PO TABS: 200.0000 mg | ORAL_TABLET | Freq: Every day | ORAL | Status: DC

## 2011-03-28 MED ORDER — COLCHICINE 0.6 MG PO TABS: 0.6000 mg | ORAL_TABLET | Freq: Every day | ORAL | Status: DC

## 2011-03-28 NOTE — Telephone Encounter (Signed)
Done

## 2011-04-16 NOTE — Assessment & Plan Note (Signed)
HEALTHCARE                         GASTROENTEROLOGY OFFICE NOTE   NAME:[name], [name]                       MRN:          [id number]  DATE:01/27/2008                            DOB:          [birth year]    REFERRING PHYSICIAN:  Ileana Roup, M.D.   [name] is a very nice 78 year old African-American female, who  comes per Dr. Meredith Pel' recommendation for two issues.  One is solid-food  dysphagia, which occurs mostly with meat, and also for colon followup.  She has a history of malignant polyp of the colon in 1991, requiring  right hemicolectomy in May 1991.  She had a subsequent colonoscopy in  1994 and 1997, with last colonoscopy in August 2002 and in November 2005  by Dr. Arlyce Dice.  She was found to have diverticulosis of the left colon.  Patient's complaints have been mostly constipation.  She denies any  abdominal pain.  Her weight has been stable, but her appetite has been  somewhat decreased.  There is no family history of colon cancer.  She  was evaluated approximately two years ago for rectal bleeding and was  found to have diverticulosis.   MEDICATIONS:  1. Famotidine 20 mg p.o. daily.  2. Allopurinol 100 mg two daily.  3. Colchicine 0.6 mg daily.  4. Diltiazem/HCL 400/20 mg one p.o. daily.   PAST HISTORY:  Significant for high blood pressure, right hemicolectomy,  inner ear problems.   FAMILY HISTORY:  Positive for breast cancer in aunt.   SOCIAL HISTORY:  Single with three children.  She retired from [employer].  She has a ninth-grade education.  Patient does not smoke and  does not drink alcohol.   REVIEW OF SYSTEMS:  Positive for upper respiratory infection, swelling  of her feet, arthritic complaints, sleeping problems, night sweats,  vision changes, and muscle pains.   PHYSICAL EXAM:  Blood pressure 150/82, pulse 72 and weight 141 pounds.  She was alert, oriented, in no distress.  She had a large ecchymosis under  her right eye after a fall, which  occurred several days ago.  She had some edema around the right orbit,  but the sclera was not hemorrhagic.  Oral cavity was normal.  NECK:  Neck was supple, no lymphadenopathy.  LUNGS:  Clear to auscultation.  COR:  With quiet precordium, normal S1 and normal S2.  ABDOMEN:  Soft, nontender, relaxed, with well-healed surgical scar.  RECTAL EXAM:  Decreased rectal tone.  Soft, brown, Hemoccult-negative  stool.  EXTREMITIES:  Slight edema of the ankles.   IMPRESSION:  1. Patient is a 77 year old African-American female with intermittent      solid-food dysphagia.  Rule out esophageal stricture, spasm, rule      out hiatal hernia.  She had an upper endoscopy in March 2001, with      findings of gastric ulcers.  She was also dilated with [facility]      dilators for esophageal stricture, rule out Barrett's esophagus.  2. Progressive constipation.  This is most likely a functional      problem, due to decreased eating and  activities.  3. History of malignant polyp of the right colon and multiple      adenomatous polyps, status post multiple polypectomies and right      hemicolectomy in 1991.  4. History of anemia.  5. History of gastric ulcer.  6. History of positive CLOtest, which was treated in 1995.   PLAN:  1. Upper endoscopy scheduled with possible esophageal dilation.  2. Colonoscopy scheduled with possible polypectomy.  3. Milk of Magnesia or prune juice or juices to improve her bowel      habits.     Hedwig Morton. Juanda Chance, MD  Electronically Signed    DMB/MedQ  DD: 01/27/2008  DT: 01/27/2008  Job #: [id number]

## 2011-04-19 NOTE — Discharge Summary (Signed)
NAME:  [name], [name]                          ACCOUNT NO.:  [id number]   MEDICAL RECORD NO.:  [id number]                   PATIENT TYPE:  INP   LOCATION:  5157                                 FACILITY:  [facility]   PHYSICIAN:  Alvester Morin, M.D.               DATE OF BIRTH:  1926/11/15   DATE OF ADMISSION:  06/21/2003  DATE OF DISCHARGE:  06/29/2003                                 DISCHARGE SUMMARY   DISCHARGE DIAGNOSES:  1. Gout.  2. Osteoarthritis.  3. Hypertension.  4. Hypokalemia.  5. Diabetes.  6. Poor nutrition.  7. Anemia.  8. Obesity.  9. Acid reflux.   DISCHARGE MEDICATIONS:  1. Reserpine 0.25 mg daily.  2. Darvocet-N 100 one tablet p.o. every 6 hours as needed for pain  3. Indomethacin 50 mg p.o. 3 times a day.  4. Protonix 40 mg p.o. daily.  5. Cartia XT 300 mg p.o. daily.  6. Iron sulfate 825 mg p.o. 3 times a day.   PROCEDURES:  July 23, right knee aspiration for fluid by radiology.   HISTORY OF PRESENT ILLNESS:  This is a 77 year old African-American female  with a history of chronic severe multiple joint pain who presented to  hospital on the day of admission with recurrence of pain, so severe that she  could not walk and was unable to perform activities of daily living.  Tylenol and Celebrex failed to relieve this pain.  On June 17, the patient  presented to the outpatient clinic and had several labs done at that time.  ESR was  61.  Rheumatoid factor was less than 20.  ANA was positive with a  ratio of 1:640 in homogenous pattern.  The patient denied any facial rash,  alopecia, photo sensitivity.   PHYSICAL EXAMINATION:  VITAL SIGNS:  Pulse rate 84, blood pressure 130/80,  respirations 20.  GENERAL:  Obese female in no apparent distress.  MUSCULOSKELETAL:  Marked deviation bilaterally of her hands with no rashes,  bruises, erythema noted.  She  did have small nodules noted on her arms from  elbow to wrist, also had a small nodule on her right  forehead.   LABORATORY DATA:  On admission, sodium 134, potassium 2.9, chloride 97,  CO2  26, BUN 36, creatinine 1.3, glucose 111.  White blood cell count 10.5 with  82% neutrophils, hemoglobin 7.7, MCV 71.1, platelets 332,000.   HOSPITAL COURSE:  1. BILATERAL KNEE PAIN AND MULTIPLE JOINT PAIN:  Labs were done.  C reactive     protein was 273.5.  Complement C3 was 127.  Complement C4 was 35.     Cardiolipin antibody was negative at 1, normal less than 20.  Anti DNA     was negative.  Rh antibodies were negative at 9, normal being 0 to 49.     Smith antibodies 5, normal being 0 to 19.  X-rays of the knee revealed  space narrowing with erosive changes.  Pseudogout could not be ruled out.     Hand x-ray revealed erosive changes also consistent with osteoarthritis     but could not rule out rheumatoid arthritis.  Synovial joint aspiration     with 0.5 ml to 1 ml of fluid removed. Analysis of that fluid revealed 590     white blood cells with 67% neutrophils and 25% lymphocytes.  No organism     were detected on Gram stain, and culture revealed no growth.  Monosodium     uric crystals were seen in the fluid.  The patient was treated with a     trial of indomethacin.  She responded very well to this and was able to     ambulate with walker to the bathroom.  Pain was also fully controlled     with Darvocet and morphine as needed.  The patient reported that she was     able to ambulate better than she was on admission on these therapies;     however, she is not up to full strength. Therefore, the patient was     transferred to subacute unit for further physical therapy and     rehabilitation.   1. ANEMIA:  On admission, hemoglobin was 7.7.  The patient had a colonoscopy     with polyp removal the week before.  Iron studies revealed an iron     deficiency anemia consistent with blood loss.  The patient was transfused     2 units of packed red blood cells on July 23.  Hemoglobin after that      stabilized with level remaining around 10.   1. HYPERTENSION:  The patient was taken off hydrochlorothiazide and     reserpine dose was increased from home dose of 0.15 to 0.25.  The patient     responded well to this.  Blood pressure in 120/80 range.   1. HYPOKALEMIA:  The patient was treated with supplemental potassium and     magnesium oxides; however, once hydrochlorothiazide was discontinued,     hypokalemia resolved, and the potassium supplementation was stopped.   1. POOR NUTRITION:  On admission, albumin was decreased, and patient     complained of unintentional weight loss of about 40 pounds in one year.     A TSH was done which was within normal limits.  The patient was     encouraged to eat more food and also was given Ensure supplementation     three times a day.  Will recheck albumin as outpatient.   DISPOSITION:  The patient was transferred to [facility] for further physical  therapy.  The patient felt much stronger and had much less pain in her  knees.  She was able to ambulate with the assistance of a walker and was  transferred to [facility] for course of rehabilitation so she could eventually be  discharged to home.      Manning Charity, MD                        Alvester Morin, M.D.    KK/MEDQ  D:  07/03/2003  T:  07/03/2003  Job:  [id number]   cc:   Foye Clock, MD

## 2011-04-19 NOTE — Discharge Summary (Signed)
NAME:  [name], [name] NO.:  [id number]   MEDICAL RECORD NO.:  [id number]                   PATIENT TYPE:  ORB   LOCATION:                                       FACILITY:  [facility]   PHYSICIAN:  C. Ulyess Mort, M.D.             DATE OF BIRTH:  04-23-1926   DATE OF ADMISSION:  06/29/2003  DATE OF DISCHARGE:  07/06/2003                                 DISCHARGE SUMMARY   CONSULTATIONS:  None.   PROCEDURE:  None.   HISTORY AND PHYSICAL:  This is a 77 year old African-American female with a  history of chronic severe multiple joint pain who presented to the hospital  on June 21, 2003 with severe disabling bilateral knee pain with her unable  to walk and the pain was unrelieved with Tylenol and Celebrex. She presented  to [facility] outpatient clinic where she had several labs done, one of  which was an ESR showing to be 61, a rheumatoid factor that was less than  20, and an ANA, which was positive with a ratio of 1:640 in a homogenous  pattern. The patient denied any symptoms besides bilateral knee pain such as  facial rash, alopecia, or photosensitivity. The patient has been  hospitalized and treated for bilateral knee pain and was discharged from the  hospital and admitted to the subacute care unit here at [facility] where she  has been since July 28.   PHYSICAL EXAMINATION ON ADMISSION:  VITAL SIGNS: Temperature 97.6, heart  rate 66, respiratory rate 20, blood pressure 134/73.  GENERAL:  She was saturating 98% on room air. She was in no apparent  distress sitting comfortably.  CARDIOVASCULAR:  Regular rate and rhythm with no murmurs, rubs, or gallops.  LUNG:  Clear to auscultation bilaterally.  EXTREMITIES:  She still had slight tenderness to palpation bilaterally of  her knees, no erythema, no palpable effusion. No signs of trauma.  NEUROLOGIC:  She had 5/5 strength bilaterally upper and lower extremities.   HOSPITAL COURSE:  While in the  subacute care was completely benign.  Complicated only once for complaint of constipation. The patient did not  complain of pain much during the hospitalization. She was asked to notify  the nursing staff or hospital staff if she did have pain so that she could  be treated for it. She tolerated indomethacin, Colchicine, and allopurinol  well while she was here. A uric acid was checked which revealed an elevated  at 8.5, normal range being 2.4-7. The patient remained focal throughout the  hospital course and is now currently ready for discharge.   CONDITION ON DISCHARGE:  Stable.   DISPOSITION:  Discharged to home in the care of her daughter.   DISCHARGE MEDICATIONS:  1. Colchicine 0.6 mg p.o. daily x2 months for gout.  2. Allopurinol 100 mg p.o. daily.  3. Ferrous sulfate 325 mg p.o. t.i.d. for iron deficiency  anemia.  4. Magnesium oxide 400 mg p.o. b.i.d.  5. Eserpine 0.25 p.o. daily for blood pressure.  6. Protonix 40 mg p.o. daily for acid reflux.  7. Cartia XT 300 mg p.o. daily for blood pressure.   DISCHARGE INSTRUCTIONS:  For pain management the patient was instructed take  ibuprofen if she had additional pain not controlled by the allopurinol or  the colchicine for the leg pain. However, she was instructed that if  ibuprofen were to irritate her stomach she can take Tylenol for the pain.  The patient voiced that she understood this and because she has had problems  with ibuprofen in the past she states she will take Tylenol.   ACTIVITY:  The patient can continue activity as tolerated.   DIET:  Maintain a diabetic low-salt diet.   DISCHARGE FOLLOW UP:  She has a hospital follow-up appointment at the  outpatient clinic here at [facility] with myself. She was to have a CBC and  BMET drawn on August 18, which is a Wednesday.      Foye Clock, MD                         Gary Fleet, M.D.    JH/MEDQ  D:  07/05/2003  T:  07/06/2003  Job:  [id number]

## 2011-04-19 NOTE — Op Note (Signed)
[facility]  Patient:    [name], [name]                       MRN: [id number] Proc. Date: 10/20/00 Adm. Date:  [id number] Disc. Date: [id number] Attending:  Antionette Char CC:         GYN Outpatient Clinic, [facility]   Operative Report  PREOPERATIVE DIAGNOSIS:       Endometrial polyp.  POSTOPERATIVE DIAGNOSIS:      Endometrial polyp.  PROCEDURE:                    Operative hysteroscopy with removal of polyps, dilatation and curettage.  SURGEON:                      Roseanna Rainbow, M.D.  ANESTHESIA:                   Managed anesthesia care, paracervical block.  COMPLICATIONS:                None.  ESTIMATED BLOOD LOSS:         50 cc.  FINDINGS:                     Anteverted uterus, normal size.  The endometrium appeared polypoid in nature.  The uterus sounded to 8 cm.  DESCRIPTION OF PROCEDURE:     The patient was taken to the operating room. She was placed in dorsal lithotomy position and prepped and draped in the usual sterile fashion.  Sterile speculum was placed in the patients vagina. The anterior lip of the cervix was then infiltrated with 2 cc of lidocaine. Single-tooth tenaculum was then applied to this location, and 5 cc of lidocaine were injected at 4 and 7 oclock to produce a paracervical block. The cervix was then dilated with [facility] dilators.  The hysteroscope was then introduced into the cervical canal using sorbitol as a distending medium and gradually advancing to the uterine fundus.  Survey of the endometrial cavity revealed the above findings.  The tubal ostia were not well visualized.  A grasper was then introduced through the operative port, and the polyps were grasped and removed.  After the polyps had been removed, the cavity was inspected to document that all of the polypoid areas had been removed.  The hysteroscope was then removed, and a sharp curettage was then performed.  All of the instruments were  then removed the vagina.  There was minimal bleeding noted.  The patient tolerated the procedure well.  The patient was taken to the PACU in stable condition.  There was a deficit of approximately 100 cc of sorbitol.  PATHOLOGY:                    Endometrial polyps and endometrial curettings. DD:  10/26/00 TD:  10/26/00 Job: [id number]

## 2011-04-19 NOTE — Discharge Summary (Signed)
NAME:  [name], [name]                          ACCOUNT NO.:  [id number]   MEDICAL RECORD NO.:  [id number]                   PATIENT TYPE:  INP   LOCATION:  4730                                 FACILITY:  [facility]   PHYSICIAN:  [name], M.D. [facility]               DATE OF BIRTH:  02/06/1926   DATE OF ADMISSION:  09/14/2003  DATE OF DISCHARGE:  09/17/2003                                 DISCHARGE SUMMARY   DISCHARGE DIAGNOSES:  1. Lower gastrointestinal bleed, most likely secondary to diverticulosis.  2. Hypokalemia, corrected.  3. Anemia.  4. Gout.  5. Hypertension.  6. Osteoarthritis.  7. Esophageal stricture status post dilatation in 2001.  8. Status post colonic polyp removal, last one in 2002.  9. Gastritis.  10.      Gastroesophageal reflux disease.  11.      Obesity.   DISCHARGE MEDICATIONS:  1. Diltiazem extended release 200 mg once a day.  2. Iron sulfate 325 mg 3 times a day.  3. Reserpine 0.25 mg once a day.  4. Allopurinol 200 mg once a day.  5. Tylenol as needed for pain.   CONDITION ON DISCHARGE:  The patient was discharged in good condition.  At  time of discharge, she had no recurrence of bleeding.  Her hemoglobin was  stable.  She was instructed to follow up with Dr. Juanda Chance in [facility]  Gastroenterology office as well as with  Dr. Margarito Liner at the [facility] for recheck of hemoglobin and hematocrit.   PROCEDURES:  No procedures were done during admission.   CONSULTATIONS:  No consultations were obtained.   BRIEF HISTORY:  [name] is a 77 year old African-American female with  known diverticulosis, history of colon polyps, status post left colectomy  for a malignant polyp.  She came in the emergency room after having three  bloody bowel movements.  She denies any diarrhea, nausea, vomiting, or  hematemesis.  No dizziness.   PHYSICAL EXAMINATION:  VITAL SIGNS: On admission, temperature 98, blood  pressure 120/87, respirations 16, heart  rate 93, O2 saturation 99% on room  air.  LUNGS:  Clear.  HEART:  Regular.  ABDOMEN:  Soft, midline scar.  RECTAL:  Positive guaiac.  EXTREMITIES:  Bilateral edema.   LABORATORY DATA:  On admission, hemoglobin was  10.1, hematocrit 30.5.  PT  13.6, INR 1.1, PTT 40.  Sodium 146, potassium 3, glucose 102, BUN 18,  creatinine 1.  MCV 81.3.   HOSPITAL COURSE:  #1.  LOWER GASTROINTESTINAL BLEED:  The patient was admitted to acute unit.  She was placed on bowel rest, and her hemoglobin and hematocrit were  monitored frequently.  No repeat colonoscopy was done as she had last  colonoscopy in July 2004.  We felt that her bleeding was secondary to  diverticulosis.  Her hemoglobin and hematocrit remained stable throughout  the hospitalization.  At the time of  discharge, her hemoglobin was 9.4, and  her hematocrit was 28.2.  The patient had no recurrence of her bleeding  during the hospital stay.   #2.  ANEMIA:  While in hospital, the patient's ferritin level was measured  and was 64, RBC 369, B12 level 126.  It was felt that part of her anemia was  iron deficiency, so patient was started on oral iron therapy.   #3.  HYPOKALEMIA:  At the time of admission, the patient was found to be  hypokalemic.  We could not find exact cause of that.  Her potassium was  repleted, and she was discharged with a normal potassium of 3.6 and  instructed to follow up with her primary care physician.   #4.  HYPERTENSION:  The patient's blood pressure was controlled during  hospital stay with her chronic medications.  No change was made in doses.   DISCHARGE LABORATORY DATA:  Sodium 141, potassium 3.6, bicarbonate 29,  glucose 83, BUN 11, creatinine 1, calcium 8.6.  White cells 5.9, hemoglobin  9.4, hematocrit19.2, platelets 318.      Lonia Blood, M.D.                          [name], M.D. [facility]    SL/MEDQ  D:  10/18/2003  T:  10/18/2003  Job:  [id number]   cc:   [facility]   Ileana Roup, M.D.  [address], [place] [address]  Fax: [phone]

## 2011-04-19 NOTE — Discharge Summary (Signed)
[name], LEASON                ACCOUNT NO.:  [id number]   MEDICAL RECORD NO.:  [id number]          PATIENT TYPE:  INP   LOCATION:  2907                         FACILITY:  [facility]   PHYSICIAN:  Madaline Guthrie, M.D.    DATE OF BIRTH:  1926/11/30   DATE OF ADMISSION:  10/18/2004  DATE OF DISCHARGE:  10/20/2004                                 DISCHARGE SUMMARY   DISCHARGE DIAGNOSES:  1.  Diverticulosis causing a lower gastrointestinal bleed.  2.  History of iron-deficiency anemia.  3.  History of hypertension.  4.  History of hypoplastic colon polyp per colonoscopy in 2002.  5.  History of hypoglycemia.  6.  History of vaginal bleeding with negative endometrial biopsy.  7.  History of gout diagnosed by arthrocentesis in August 2004.  8.  History of osteoarthritis.  9.  History of esophageal stricture, status post dilation in 2001.  10. History of gastritis.  11. History of acid reflux disease.   DISCHARGE MEDICATIONS:  1.  Iron sulfate 325 mg t.i.d.  2.  Diltiazem extended release 200 mg daily.  3.  Reserpine 0.25 mg daily.  4.  Colchicine 0.6 mg daily.   FOLLOW UP:  The patient will follow-up with the [facility]  Medicine Outpatient [facility], the Clinic will contact her with an appointment.   HISTORY OF PRESENT ILLNESS:  The patient is a 77 year old African-American  female with a history of a diverticular bleed in June 2004 who presented to  the emergency room because of noticing bright red blood when she went to the  bathroom.  According to the patient, she had some episodes of gushing blood  but could not quantify the amount.  Patient was also complaining of some  dull, mild, crampy abdominal pain with no nausea, vomiting, diarrhea or  melena.   PHYSICAL EXAMINATION:  VITAL SIGNS:  Temperature 97.0, pulse 85, blood  pressure 152/96, respirations 18, O2 saturations 98% on room air.  For  further details of physical exam, please see admission H&P.  In  general, the  patient was in no acute distress with regular rate and rhythm  other than a  few ectopic beats.  Respirations were clear with symmetric breaths. Abdomen  was soft with positive bowel sounds, nontender.  Fecal occult blood positive  with gross blood visualized.   HOSPITAL COURSE:  PROBLEM BRIGHT RED BLOOD PER RECTUM:  The patient was  admitted to the step-down unit with 2 large bore IV accesses obtained. H&H  were checked q.6h. with 2 units of packed red blood cells typed and  screened. Dr. Arlyce Dice of gastroenterology was consulted. He performed a  colonoscopy on November 18th which revealed no active bleeding but multiple  diverticula, which he believed to be the source of her lower GI bleeds.  The  patient was orthostatic upon arrival to the step-down unit.  She was treated  with aggressive IV hydration.  Her hemoglobin dropped down from 10.7 on  admission to 8.9. She was transfused 1 unit of packed red blood cells with  resulting hemoglobin of 10.3. This remained stable,  and at the time of  discharge, her hemoglobin was 10.5.  The patient was discharged to home in  good condition with no orthostasis. She is to follow-up in the internal  medicine outpatient clinic, and needs a repeat H&H at that time.   DISCHARGE LABORATORY DATA:  CBC:  WBC 5.9, hemoglobin 10.5, platelet count  214,000.  A BMET showed sodium 143, potassium 3.3, chloride 113, bicarbonate  25, BUN 11, creatinine 1.1 and glucose 81.       KK/MEDQ  D:  10/21/2004  T:  10/21/2004  Job:  [id number]

## 2011-05-22 ENCOUNTER — Ambulatory Visit (INDEPENDENT_AMBULATORY_CARE_PROVIDER_SITE_OTHER): Payer: Medicare Other | Admitting: Internal Medicine

## 2011-05-22 ENCOUNTER — Encounter: Payer: Self-pay | Admitting: Internal Medicine

## 2011-05-22 VITALS — BP 102/67 | HR 64 | Temp 97.8°F | Wt 124.5 lb

## 2011-05-22 DIAGNOSIS — R5383 Other fatigue: Secondary | ICD-10-CM

## 2011-05-22 DIAGNOSIS — N259 Disorder resulting from impaired renal tubular function, unspecified: Secondary | ICD-10-CM

## 2011-05-22 DIAGNOSIS — D509 Iron deficiency anemia, unspecified: Secondary | ICD-10-CM

## 2011-05-22 DIAGNOSIS — I10 Essential (primary) hypertension: Secondary | ICD-10-CM

## 2011-05-22 DIAGNOSIS — R5381 Other malaise: Secondary | ICD-10-CM

## 2011-05-22 LAB — FERRITIN: Ferritin: 48 ng/mL (ref 10–291)

## 2011-05-22 NOTE — Assessment & Plan Note (Signed)
Lab Results  Component Value Date   NA 139 02/12/2011   K 4.4 02/12/2011   CL 100 02/12/2011   CO2 26 02/12/2011   BUN 24* 02/12/2011   CREATININE 1.52* 02/12/2011   CREATININE 1.62* 12/12/2010    BP Readings from Last 3 Encounters:  05/22/11 102/67  02/12/11 134/76  10/10/10 125/83    Assessment: Hypertension control:  controlled  Progress toward goals:  at goal Barriers to meeting goals:  no barriers identified  Plan: Hypertension treatment:  continue current medications

## 2011-05-22 NOTE — Assessment & Plan Note (Signed)
Creat  Date Value Range Status  02/12/2011 1.52* 0.40-1.20 (mg/dL) Final     Creatinine, Ser  Date Value Range Status  12/12/2010 1.62* 0.40-1.20 (mg/dL) Final    Patient has stable chronic renal insufficiency.  The plan is to check a metabolic panel today.

## 2011-05-22 NOTE — Assessment & Plan Note (Signed)
Lab Results  Component Value Date   HGB 10.9* 02/12/2011    Plan is to check a CBC and ferritin level today.

## 2011-05-22 NOTE — Progress Notes (Signed)
  Subjective:    Patient ID: [name], female    DOB: [birth year], 77 y.o.   MRN: [id number]  HPI Patient returns for followup of her hypertension, anemia, chronic renal insufficiency, and other chronic medical problems.  Today she reports fatigue which is chronic; otherwise she has no complaints, and reports that she has been doing well.  She reports that she is compliant with her medications.  She has had no recent gout symptoms.   Review of Systems  Constitutional: Negative for fever, chills, appetite change and unexpected weight change.  Respiratory: Negative for cough and shortness of breath.   Cardiovascular: Positive for leg swelling. Negative for chest pain.  Gastrointestinal: Negative for nausea, vomiting, abdominal pain, constipation and blood in stool.  Genitourinary: Negative for dysuria and difficulty urinating.  Musculoskeletal: Negative for joint swelling and arthralgias.       Objective:   Physical Exam  Constitutional: She appears well-nourished. No distress.  Cardiovascular: Normal rate, regular rhythm, S1 normal and S2 normal.  Exam reveals no gallop, no S3, no S4 and no friction rub.   Murmur heard.  Systolic murmur is present with a grade of 1/6       No lower extremity edema.  Pulmonary/Chest: Effort normal and breath sounds normal. She has no wheezes. She has no rales.  Abdominal: Soft. Bowel sounds are normal. There is no hepatosplenomegaly. There is no tenderness.          Assessment & Plan:

## 2011-05-22 NOTE — Patient Instructions (Signed)
Continue current medications. 

## 2011-05-22 NOTE — Assessment & Plan Note (Signed)
Patient has stable chronic mild fatigue, with negative prior workup.  As above, will check a CBC today.  Her thyroid studies have been normal in the past.

## 2011-05-23 LAB — COMPLETE METABOLIC PANEL WITH GFR
ALT: 13 U/L (ref 0–35)
AST: 27 U/L (ref 0–37)
Alkaline Phosphatase: 121 U/L — ABNORMAL HIGH (ref 39–117)
BUN: 44 mg/dL — ABNORMAL HIGH (ref 6–23)
Calcium: 9.8 mg/dL (ref 8.4–10.5)
Creat: 2.17 mg/dL — ABNORMAL HIGH (ref 0.50–1.10)
Total Bilirubin: 0.6 mg/dL (ref 0.3–1.2)

## 2011-05-23 LAB — CBC WITH DIFFERENTIAL/PLATELET
Basophils Absolute: 0 10*3/uL (ref 0.0–0.1)
Basophils Relative: 0 % (ref 0–1)
Eosinophils Absolute: 0 10*3/uL (ref 0.0–0.7)
Eosinophils Relative: 0 % (ref 0–5)
Lymphs Abs: 2.6 10*3/uL (ref 0.7–4.0)
MCH: 29.1 pg (ref 26.0–34.0)
MCV: 86.6 fL (ref 78.0–100.0)
Neutrophils Relative %: 58 % (ref 43–77)
Platelets: 312 10*3/uL (ref 150–400)
RBC: 3.82 MIL/uL — ABNORMAL LOW (ref 3.87–5.11)
RDW: 16 % — ABNORMAL HIGH (ref 11.5–15.5)
WBC: 7.2 10*3/uL (ref 4.0–10.5)

## 2011-05-24 ENCOUNTER — Other Ambulatory Visit: Payer: Self-pay | Admitting: Internal Medicine

## 2011-05-24 DIAGNOSIS — N259 Disorder resulting from impaired renal tubular function, unspecified: Secondary | ICD-10-CM

## 2011-05-24 NOTE — Progress Notes (Signed)
Patient's creatinine on June 20 was elevated compared to baseline.  The plan is to have her return in one week for repeat basic metabolic panel.  If it remains elevated, the plan is to refer her to nephrology for evaluation.  I tried to call patient at the home number listed in the chart, but it was disconnected.  Will ask the clinic nurse to try to contact patient for follow-up blood work.

## 2011-06-06 ENCOUNTER — Other Ambulatory Visit: Payer: Medicare Other

## 2011-06-16 ENCOUNTER — Inpatient Hospital Stay (HOSPITAL_COMMUNITY)
Admission: EM | Admit: 2011-06-16 | Discharge: 2011-06-21 | DRG: 390 | Disposition: A | Discharge status: Home / Self Care | Payer: Medicare Other | Attending: Surgery | Admitting: Surgery

## 2011-06-16 ENCOUNTER — Emergency Department (HOSPITAL_COMMUNITY): Payer: Medicare Other

## 2011-06-16 DIAGNOSIS — N83209 Unspecified ovarian cyst, unspecified side: Secondary | ICD-10-CM | POA: Diagnosis present

## 2011-06-16 DIAGNOSIS — I129 Hypertensive chronic kidney disease with stage 1 through stage 4 chronic kidney disease, or unspecified chronic kidney disease: Secondary | ICD-10-CM | POA: Diagnosis present

## 2011-06-16 DIAGNOSIS — K56609 Unspecified intestinal obstruction, unspecified as to partial versus complete obstruction: Secondary | ICD-10-CM

## 2011-06-16 DIAGNOSIS — K219 Gastro-esophageal reflux disease without esophagitis: Secondary | ICD-10-CM | POA: Diagnosis present

## 2011-06-16 DIAGNOSIS — Z79899 Other long term (current) drug therapy: Secondary | ICD-10-CM

## 2011-06-16 DIAGNOSIS — K449 Diaphragmatic hernia without obstruction or gangrene: Secondary | ICD-10-CM | POA: Diagnosis present

## 2011-06-16 DIAGNOSIS — R112 Nausea with vomiting, unspecified: Secondary | ICD-10-CM

## 2011-06-16 DIAGNOSIS — M109 Gout, unspecified: Secondary | ICD-10-CM | POA: Diagnosis present

## 2011-06-16 DIAGNOSIS — N189 Chronic kidney disease, unspecified: Secondary | ICD-10-CM | POA: Diagnosis present

## 2011-06-16 LAB — COMPREHENSIVE METABOLIC PANEL
AST: 27 U/L (ref 0–37)
Albumin: 4.4 g/dL (ref 3.5–5.2)
Alkaline Phosphatase: 144 U/L — ABNORMAL HIGH (ref 39–117)
BUN: 25 mg/dL — ABNORMAL HIGH (ref 6–23)
CO2: 29 mEq/L (ref 19–32)
Chloride: 91 mEq/L — ABNORMAL LOW (ref 96–112)
GFR calc non Af Amer: 33 mL/min — ABNORMAL LOW (ref >60)
Potassium: 4.4 mEq/L (ref 3.5–5.1)
Total Bilirubin: 0.5 mg/dL (ref 0.3–1.2)

## 2011-06-16 LAB — URINALYSIS, ROUTINE W REFLEX MICROSCOPIC
Glucose, UA: NEGATIVE mg/dL
Hgb urine dipstick: NEGATIVE
pH: 8 (ref 5.0–8.0)

## 2011-06-16 LAB — DIFFERENTIAL
Eosinophils Absolute: 0 10*3/uL (ref 0.0–0.7)
Lymphocytes Relative: 9 % — ABNORMAL LOW (ref 12–46)
Lymphs Abs: 0.6 10*3/uL — ABNORMAL LOW (ref 0.7–4.0)
Monocytes Relative: 6 % (ref 3–12)
Neutro Abs: 5.9 10*3/uL (ref 1.7–7.7)
Neutrophils Relative %: 85 % — ABNORMAL HIGH (ref 43–77)

## 2011-06-16 LAB — CBC
Hemoglobin: 12 g/dL (ref 12.0–15.0)
MCV: 85.3 fL (ref 78.0–100.0)
Platelets: 238 10*3/uL (ref 150–400)
RBC: 4.08 MIL/uL (ref 3.87–5.11)
WBC: 6.9 10*3/uL (ref 4.0–10.5)

## 2011-06-16 LAB — OCCULT BLOOD, POC DEVICE: Fecal Occult Bld: NEGATIVE

## 2011-06-16 LAB — URINE MICROSCOPIC-ADD ON

## 2011-06-16 MED ORDER — IOHEXOL 300 MG/ML  SOLN
70.0000 mL | Freq: Once | INTRAMUSCULAR | Status: AC | PRN
  Administered 2011-06-16: 70 mL via INTRAVENOUS

## 2011-06-17 ENCOUNTER — Inpatient Hospital Stay (HOSPITAL_COMMUNITY): Payer: Medicare Other

## 2011-06-17 LAB — CBC
Hemoglobin: 12.3 g/dL (ref 12.0–15.0)
Platelets: 245 10*3/uL (ref 150–400)
RBC: 4.09 MIL/uL (ref 3.87–5.11)
WBC: 7.3 10*3/uL (ref 4.0–10.5)

## 2011-06-17 LAB — BASIC METABOLIC PANEL
CO2: 23 mEq/L (ref 19–32)
Calcium: 8.9 mg/dL (ref 8.4–10.5)
Chloride: 97 mEq/L (ref 96–112)
Potassium: 3.7 mEq/L (ref 3.5–5.1)
Sodium: 135 mEq/L (ref 135–145)

## 2011-06-18 ENCOUNTER — Inpatient Hospital Stay (HOSPITAL_COMMUNITY): Payer: Medicare Other

## 2011-06-18 LAB — BASIC METABOLIC PANEL
CO2: 22 mEq/L (ref 19–32)
Chloride: 93 mEq/L — ABNORMAL LOW (ref 96–112)
GFR calc non Af Amer: 59 mL/min — ABNORMAL LOW (ref >60)
Glucose, Bld: 116 mg/dL — ABNORMAL HIGH (ref 70–99)
Potassium: 4.6 mEq/L (ref 3.5–5.1)
Sodium: 128 mEq/L — ABNORMAL LOW (ref 135–145)

## 2011-06-18 NOTE — H&P (Signed)
[name], [name] NO.:  [id number]  MEDICAL RECORD NO.:  [id number]  LOCATION:  5148                         FACILITY:  [facility]  PHYSICIAN:  Juanetta Gosling, MDDATE OF BIRTH:  08-25-1926  DATE OF ADMISSION:  06/16/2011 DATE OF DISCHARGE:                             HISTORY & PHYSICAL   CONSULT PHYSICIAN:  Emergency room.  CHIEF COMPLAINT:  Nausea and vomiting.  HISTORY OF PRESENT ILLNESS:  This is an 77 year old female who was in her usual state of health until yesterday when she began having nausea and nonbilious emesis and felt very weak.  She spent the entire day what she says in bed.  She had no appetite during all evening saying she continued to feel weak and her daughter brought her to the emergency room.  Today, she has not had any more emesis overnight or today.  She did have a bowel movement yesterday.  She cannot remember if she has really passed any flatus in the last 2 days.  She did not get any real relief and was unable to take oral intake which is why what brought her in.  PAST MEDICAL HISTORY: 1. Gout. 2. Hypertension. 3. GERD with history of esophageal stricture. 4. Chronic renal insufficiency. 5. History of GI bleed, presumably from diverticulosis.  PAST SURGICAL HISTORY:  In 1991, she had a right colectomy for what sounds like adenomatous polyps.  FAMILY HISTORY:  Noncontributory.  SOCIAL HISTORY:  She does not smoke or drink or do any drugs.  She lives with her son.  MEDICATIONS: 1. Diltiazem 420 mg p.o. daily. 2. Colchicine 0.6 mg daily. 3. Losartan 50 mg daily. 4. Pepcid 20 mg daily. 5. Allopurinol 100 mg 2 daily.  ALLERGIES:  None known.  REVIEW OF SYSTEMS:  Otherwise negative.  She denies any cardiac or pulmonary problems.  PHYSICAL EXAMINATION:  VITAL SIGNS:  Temperature 98.2, heart rate 93, blood pressure 155/94, respiratory rate 20, and oxygen saturation 99%. GENERAL:  She is a well-appearing,  well-developed elderly female in no distress.  She got up and went to the bathroom very easily while I was in the room air. HEENT:  Sclerae are anicteric. NECK:  Supple without adenopathy. HEART:  Regular rate and rhythm. LUNGS:  Clear bilaterally. ABDOMEN:  Soft.  She has some mild right upper quadrant tenderness to palpation, but otherwise she is nontender and nondistended and she has bowel sounds that are present. EXTREMITIES:  No edema.  LABORATORY EVALUATION:  White blood cell count 6.9, hematocrit 34.8, platelets 238, 85 segs.  Sodium 133, BUN 25, creatinine 1.49, and glucose 105.  Urinalysis is negative.  Lipase is 95.  She is heme negative.  Venous lactate 1.6.  Her liver function test showed alkaline phosphatase of 44, AST 27, ALT 17, and a total bilirubin of 0.5.  CT scan report describes a large hiatal hernia and a small bowel dilated at the level of colonic resections with findings suggestive of obstruction. There is air and stool present in the colon.  There is no free air or any free fluid.  I think that this is in the small bowels that appears anastomosis and the small was not dilated  to the anastomosis and it appears it goes somewhere in the region of the gallbladder where it is decompressed somewhere after this.  There is a dilated gallbladder with dependent gallstones present.  She also has a right ovarian cyst.  ASSESSMENT:  Small bowel obstruction.  PLAN:  This is likely an adhesive in nature, small bowel obstruction. She does have a little bit of right upper quadrant tenderness, but has no prior gallbladder symptoms and I think this is just likely obstruction as she does not appear to have gallbladder disease.  I discussed with her conservative management of likely small bowel obstruction.  We are going to admit her, make her n.p.o., and place her on IV fluids.  I will obtain some x-rays in the morning on her.  If she has any more emesis, then we will plan on  putting a nasogastric tube. The fact that she is not really distended at all and has no more nausea and vomiting, I think it will be reasonable to not do an NG tube at this point.  She is going to need this right ovarian cyst evaluated as an outpatient electively as well.     Juanetta Gosling, MD     MCW/MEDQ  D:  06/16/2011  T:  06/17/2011  Job:  [id number]  cc:   Ileana Roup, M.D.  Electronically Signed by Emelia Loron MD on 06/18/2011 07:30:59 PM

## 2011-07-09 NOTE — Discharge Summary (Signed)
  NAMEJIRAH, [name]                ACCOUNT NO.:  [id number]  MEDICAL RECORD NO.:  [id number]  LOCATION:  5148                         FACILITY:  [facility]  PHYSICIAN:  Sandria Bales. Ezzard Standing, M.D.  DATE OF BIRTH:  08-31-1926  DATE OF ADMISSION:  06/16/2011 DATE OF DISCHARGE:  06/21/2011                              DISCHARGE SUMMARY   HISTORY OF PRESENT ILLNESS:  Ms. Cantu is an 77 year old female who began having some nausea and vomiting, feeling very weak and abdominal distention.  She may had a small bowel movement prior to the day of admission, but had not passed any real flatus.  She presented to the emergency department where a workup including CT scan and labs showed elements of small bowel dilatation suggestive of at least partial small bowel obstruction.  However, there was air in stool present in the colon.  She was admitted and placed on bowel rest by Dr. Dwain Sarna after his evaluation.  SUMMARY OF HOSPITAL COURSE:  The patient was admitted on June 16, 2011, again an NG tube was not placed.  Serial x-rays following admission showed persistent evidence of small bowel obstruction without significant change; however, clinically the patient began to make improvements.    She started passing flatus and actually having small bowel movements on her own and was no longer nauseated.  We started her on clear liquid diet and gradually advanced her to full liquids and subsequently solid foods, which she is tolerating well.  She has had several normal-appearing bowel movements over the past 24-48 hours and actually feels quite well at this point in time.  We feel that the patient is now appropriate for discharge home that whenever partial small bowel obstruction has likely resolved completely.  DISCHARGE DIAGNOSES: 1. Partial small bowel obstruction - resolved. 2. Hypertension - stable. 3. Chronic renal insufficiency - stable. 4. Gout - stable. 5. Gastroesophageal reflux disease with  hiatal hernia - unchanged.  DISCHARGE MEDICATIONS:  The patient will resume all home medications including: 1. Allopurinol 100 mg two tablets daily. 2. Diltiazem CD 420 mg daily. 3. Colchicine 0.6 mg daily. 4. Pepcid 20 mg daily. 5. Losartan 50 mg daily. 6. Colace 100 mg one capsule daily.  She could use MiraLax as needed     if she presents with further constipation.  She can follow up with     her primary care physician as directed.  She can contact our office     with other questions or concerns.    Brayton El, PA-C   Sandria Bales. Ezzard Standing, M.D., FACS  KB/MEDQ  D:  06/21/2011  T:  06/21/2011  Job:  [id number]  Electronically Signed by Brayton El  on 07/08/2011 02:57:28 PM Electronically Signed by Ovidio Kin M.D. on 07/09/2011 09:28:14 AM

## 2011-07-11 NOTE — Progress Notes (Signed)
Addended by: Bufford Spikes on: 07/11/2011 12:00 PM   Modules accepted: Orders

## 2011-08-07 ENCOUNTER — Encounter: Payer: Self-pay | Admitting: Internal Medicine

## 2011-08-07 ENCOUNTER — Ambulatory Visit (INDEPENDENT_AMBULATORY_CARE_PROVIDER_SITE_OTHER): Payer: Medicare Other | Admitting: Internal Medicine

## 2011-08-07 VITALS — BP 122/74 | HR 86 | Temp 97.1°F | Ht 59.0 in | Wt 118.1 lb

## 2011-08-07 DIAGNOSIS — D509 Iron deficiency anemia, unspecified: Secondary | ICD-10-CM

## 2011-08-07 DIAGNOSIS — E559 Vitamin D deficiency, unspecified: Secondary | ICD-10-CM

## 2011-08-07 DIAGNOSIS — R2681 Unsteadiness on feet: Secondary | ICD-10-CM

## 2011-08-07 DIAGNOSIS — R634 Abnormal weight loss: Secondary | ICD-10-CM

## 2011-08-07 DIAGNOSIS — R269 Unspecified abnormalities of gait and mobility: Secondary | ICD-10-CM

## 2011-08-07 DIAGNOSIS — Z23 Encounter for immunization: Secondary | ICD-10-CM

## 2011-08-07 DIAGNOSIS — I10 Essential (primary) hypertension: Secondary | ICD-10-CM

## 2011-08-07 HISTORY — DX: Unsteadiness on feet: R26.81

## 2011-08-07 LAB — CBC WITH DIFFERENTIAL/PLATELET
Eosinophils Absolute: 0 10*3/uL (ref 0.0–0.7)
HCT: 34.8 % — ABNORMAL LOW (ref 36.0–46.0)
Hemoglobin: 11.5 g/dL — ABNORMAL LOW (ref 12.0–15.0)
Lymphs Abs: 2.1 10*3/uL (ref 0.7–4.0)
MCH: 29.4 pg (ref 26.0–34.0)
Monocytes Relative: 7 % (ref 3–12)
Neutrophils Relative %: 58 % (ref 43–77)
RBC: 3.91 MIL/uL (ref 3.87–5.11)

## 2011-08-07 NOTE — Progress Notes (Signed)
  Subjective:    Patient ID: [name], female    DOB: [birth year], 77 y.o.   MRN: [id number]  HPI Patient presents for followup of her hypertension, anemia, and other chronic medical problems.  She was hospitalized in July with an apparent partial small bowel obstruction which resolved with conservative treatment; she has had no symptoms of abdominal pain, nausea, or vomiting since her hospitalization.  She reports chronic fatigability and some recent feelings of unsteady gait when she is tired; she has not fallen.  She has a walker at home, but has not recently been using it.  Otherwise she has no complaints.  She reports that she is compliant with her medications.  She has had no recent gout symptoms.  Review of Systems  Constitutional: Negative for fever and chills.  Respiratory: Negative for shortness of breath.   Cardiovascular: Positive for leg swelling (Mild ankle edema.). Negative for chest pain.  Gastrointestinal: Negative for nausea, vomiting, abdominal pain, diarrhea, blood in stool and abdominal distention.  Genitourinary: Negative for dysuria.  Musculoskeletal: Negative for myalgias and arthralgias.       Objective:   Physical Exam  Constitutional: No distress.  Cardiovascular: Normal rate, regular rhythm and normal heart sounds.  Exam reveals no gallop and no friction rub.   No murmur heard. Pulmonary/Chest: Breath sounds normal. She has no wheezes. She has no rales.  Abdominal: Soft. Bowel sounds are normal. She exhibits no distension. There is no hepatosplenomegaly. There is no tenderness. There is no rebound and no guarding.  Musculoskeletal: She exhibits no edema.          Assessment & Plan:

## 2011-08-07 NOTE — Patient Instructions (Signed)
Please keep appointment for physical therapy. Please keep appointment with GYN clinic for Pap smear.

## 2011-08-08 LAB — COMPLETE METABOLIC PANEL WITH GFR
Albumin: 4.5 g/dL (ref 3.5–5.2)
CO2: 23 mEq/L (ref 19–32)
GFR, Est African American: 46 mL/min — ABNORMAL LOW (ref >60)
GFR, Est Non African American: 38 mL/min — ABNORMAL LOW (ref >60)
Glucose, Bld: 86 mg/dL (ref 70–99)
Potassium: 3.9 mEq/L (ref 3.5–5.3)
Sodium: 140 mEq/L (ref 135–145)
Total Bilirubin: 0.6 mg/dL (ref 0.3–1.2)
Total Protein: 7.1 g/dL (ref 6.0–8.3)

## 2011-08-08 NOTE — Assessment & Plan Note (Signed)
Patient reports unsteady gait when she is tired and is concerned about fall risk.  The plan is to refer her to physical therapy for evaluation and treatment.  I advised her to use her walker for safety.

## 2011-08-08 NOTE — Assessment & Plan Note (Signed)
  BP Readings from Last 3 Encounters:  08/07/11 122/74  05/22/11 102/67  02/12/11 134/76    Assessment: Hypertension control:  controlled  Progress toward goals:  at goal Barriers to meeting goals:  no barriers identified  Plan: Hypertension treatment:  continue current medications

## 2011-08-08 NOTE — Assessment & Plan Note (Signed)
Patient's weight has been gradually declining over the past 2 years; there is no evident pathologic process causing this.  The plan is to check a comprehensive metabolic panel, CBC, and TSH.  She is up-to-date on breast and colon cancer screening; I advised that she have a Pap smear done and have referred her to GYN for that.  Recent imaging during her hospitalization did not show any findings that would explain her weight loss.  I advised her to make sure that she is eating adequately and for now the plan is to check labs as above and follow clinically.

## 2011-08-08 NOTE — Assessment & Plan Note (Addendum)
Given patient's fatigability, will check a CBC.

## 2011-08-08 NOTE — Assessment & Plan Note (Signed)
The patient has a history of vitamin D deficiency treated in the past.  The plan is check a vitamin D level.

## 2011-08-10 ENCOUNTER — Encounter: Payer: Self-pay | Admitting: Internal Medicine

## 2011-08-10 ENCOUNTER — Other Ambulatory Visit: Payer: Self-pay | Admitting: Internal Medicine

## 2011-08-10 DIAGNOSIS — E559 Vitamin D deficiency, unspecified: Secondary | ICD-10-CM

## 2011-08-10 MED ORDER — VITAMIN D3 25 MCG (1000 UNIT) PO TABS
1000.0000 [IU] | ORAL_TABLET | Freq: Every day | ORAL | Status: DC
Start: 2011-08-10 — End: 2011-12-25

## 2011-08-10 NOTE — Assessment & Plan Note (Signed)
Patient's vitamin D level was 26.  The plan is to start vitamin D3 1000 units daily.

## 2011-08-12 ENCOUNTER — Telehealth: Payer: Self-pay | Admitting: *Deleted

## 2011-08-12 NOTE — Telephone Encounter (Signed)
Message copied by Rocco Pauls on Mon Aug 12, 2011 11:04 AM ------      Message from: Margarito Liner      Created: Sat Aug 10, 2011  3:37 PM       I recently saw Ms. Schnieders in clinic, and her vitamin D level was somewhat low at 26.  I would like for her to start taking vitamin D3 1,000 units 1 tablet daily.  Please call patient and instruct her to start taking this medication.  (I entered the medication order today) the            Thanks,      Dorene Sorrow

## 2011-08-12 NOTE — Telephone Encounter (Signed)
i have tried the ph# s listed on chart and they have been disconnected.

## 2011-09-10 ENCOUNTER — Other Ambulatory Visit: Payer: Self-pay | Admitting: Internal Medicine

## 2011-09-10 DIAGNOSIS — Z1231 Encounter for screening mammogram for malignant neoplasm of breast: Secondary | ICD-10-CM

## 2011-10-03 ENCOUNTER — Ambulatory Visit: Payer: Medicare Other | Admitting: Advanced Practice Midwife

## 2011-10-07 ENCOUNTER — Ambulatory Visit: Payer: Medicare Other | Attending: Internal Medicine | Admitting: Physical Therapy

## 2011-10-07 DIAGNOSIS — R269 Unspecified abnormalities of gait and mobility: Secondary | ICD-10-CM | POA: Insufficient documentation

## 2011-10-07 DIAGNOSIS — M6281 Muscle weakness (generalized): Secondary | ICD-10-CM | POA: Insufficient documentation

## 2011-10-07 DIAGNOSIS — IMO0001 Reserved for inherently not codable concepts without codable children: Secondary | ICD-10-CM | POA: Insufficient documentation

## 2011-10-11 ENCOUNTER — Ambulatory Visit (INDEPENDENT_AMBULATORY_CARE_PROVIDER_SITE_OTHER): Payer: Medicare Other | Admitting: Advanced Practice Midwife

## 2011-10-11 ENCOUNTER — Encounter: Payer: Self-pay | Admitting: Advanced Practice Midwife

## 2011-10-11 ENCOUNTER — Other Ambulatory Visit (HOSPITAL_COMMUNITY)
Admission: RE | Admit: 2011-10-11 | Discharge: 2011-10-11 | Disposition: A | Discharge status: Home / Self Care | Payer: Medicare Other | Source: Ambulatory Visit | Attending: Advanced Practice Midwife | Admitting: Advanced Practice Midwife

## 2011-10-11 VITALS — BP 113/74 | HR 83 | Temp 96.9°F | Ht <= 58 in | Wt 114.7 lb

## 2011-10-11 DIAGNOSIS — Z124 Encounter for screening for malignant neoplasm of cervix: Secondary | ICD-10-CM | POA: Insufficient documentation

## 2011-10-11 DIAGNOSIS — Z01419 Encounter for gynecological examination (general) (routine) without abnormal findings: Secondary | ICD-10-CM

## 2011-10-11 NOTE — Progress Notes (Signed)
  Subjective:     [name] is a 77 y.o. woman who comes in today for a  pap smear only. Her most recent annual exam was last month. Her most recent Pap smear was in 2005. and showed no abnormalities. Previous abnormal Pap smears: no. Contraception: post menopausal status Sent by family practice doctor for pap, last 2005  The following portions of the patient's history were reviewed and updated as appropriate: allergies, current medications, past family history, past medical history, past social history, past surgical history and problem list.  Review of Systems Pertinent items are noted in HPI.   Objective:    BP 113/74  Pulse 83  Temp(Src) 96.9 F (36.1 C) (Oral)  Ht 4\' 10"  (1.473 m)  Wt 114 lb 11.2 oz (52.028 kg)  BMI 23.97 kg/m2 Pelvic Exam:  EGBUS wnl with some atrophy.  Vagina without lesions or discharge.  Cervix normal. Uterus normal No adnexal masses, but exam somewhat limited by habitus. Pap smear obtained.  Assessment:    Screening pap smear.  Postmenopausal status Unintentional weight loss, followed by medical doctor.  Plan:    Follow up in 1 year, or as indicated by Pap results.  Follow up medical problems with medical doctor.

## 2011-10-11 NOTE — Patient Instructions (Addendum)
Pap Test A Pap test is a sampling of cells from a woman's cervix. The cervix is the opening between the vagina (birth canal) and the uterus (the bottom part of the womb). The cells are scraped from the cervix during a pelvic exam. These cells are then looked at under a microscope to see if the cells are normal or to see if a cancer is developing or there are changes that suggest a cancer will develop. Cervical dysplasia is a condition in which a woman has abnormal changes in the top layer of cells of her cervix. These changes are an early sign that cervical cancer may develop. Pap tests also look for the human papilloma virus (HPV) because it has 4 types that are responsible for 70% of cervical cancer. Infections can also be found during a Pap test such as bacteria, fungus, protozoa and viruses.  Cervical cancer is harder to treat and less likely to have a good outcome if left untreated. Catching the disease at an early stage leads to a better outcome. Since the Pap test was introduced 60 years ago, deaths from cervical cancer have decreased by 70%. Every woman should keep up to date with Pap tests. RISK FACTORS FOR CERVICAL CANCER INCLUDE:   Becoming sexually active before age 18.   Being the daughter of a woman who took diethylstilbestrol (DES) during pregnancy.   Having a sexual partner who has or has had cancer of the penis.   Having a sexual partner whose past partner had cervical cancer or cervical dysplasia (early cell changes which suggest a cancer may develop).   Having a weakened immune system. An example would be HIV or other immunodeficiency disorder.   Having had a sexually transmitted infection such as chlamydia, gonorrhea or HPV.   Having had an abnormal Pap or cancer of the vagina or vulva.   Having had more than one sexual partner.   A history of cervical cancer in a woman's sister or mother.   Not using condoms with new sexual partners.   Smoking.  WHO SHOULD HAVE PAP  TESTS  A Pap test is done to screen for cervical cancer.   The first Pap test should be done at age 21.   Between ages 21 and 29, Pap tests are repeated every 2 years.   Beginning at age 30, you are advised to have a Pap test every 3 years as long as your past 3 Pap tests have been normal.   Some women have medical problems that increase the chance of getting cervical cancer. Talk to your caregiver about these problems. It is especially important to talk to your caregiver if a new problem develops soon after your last Pap test. In these cases, your caregiver may recommend more frequent screening and Pap tests.   The above recommendations are the same for women who have or have not gotten the vaccine for HPV (Human Papillomavirus).   If you had a hysterectomy for a problem that was not a cancer or a condition that could lead to cancer, then you no longer need Pap tests. However, even if you no longer need a Pap test, a regular exam is a good idea to make sure no other problems are starting.    If you are between ages 65 and 70, and you have had normal Pap tests going back 10 years, you no longer need Pap tests. However, even if you no longer need a Pap test, a regular exam is a good idea   to make sure no other problems are starting.    If you have had past treatment for cervical cancer or a condition that could lead to cancer, you need Pap tests and screening for cancer for at least 20 years after your treatment.   If Pap tests have been discontinued, risk factors (such as a new sexual partner) need to be re-assessed to determine if screening should be resumed.   Some women may need screenings more often if they are at high risk for cervical cancer.  PREPARATION FOR A PAP TEST A Pap test should be performed during the weeks before the start of menstruation. Women should not douche or have sexual intercourse for 24 hours before the test. No vaginal creams, diaphragms, or tampons should be  used for 24 hours before the test. To minimize discomfort, a woman should empty her bladder just before the exam. TAKING THE PAP TEST The caregiver will perform a pelvic exam. A metal or plastic instrument (speculum) is placed in the vagina. This is done before your caregiver does a bimanual exam of your internal female organs. This instrument allows your caregiver to see the inside of the vagina and look at the cervix. A small, sterile brush is used to take a sample of cells from the internal opening of the cervix. A small wooden spatula is used to scrape the outside of the cervix. Neither of these two methods to collect cells will cause you pain. These two scrapings are placed on a glass slide or in a small bottle filled with a special liquid. The cells are looked at later under a microscope in a lab. A specialist will look at these cells and determine if the cells are normal. RESULTS OF YOUR PAP TEST  A healthy Pap test shows no abnormal cells or evidence of inflammation.   The presence of abnormally growing cells on the surface of the cervix may be reported as an abnormal Pap test. Different categories of findings are used to describe your Pap test. Your caregiver will go over the importance of these findings with you. The caregiver will then determine what follow-up is needed or when you should have your next pap test.   If you have had two or more abnormal Pap tests:   You may be asked to have a colposcopy. This is a test in which the cervix is viewed with a special lighted microscope.   A cervical tissue sample (biopsy) may also be needed. This involves taking a small tissue sample from the cervix. The sample is looked at under a microscope to find the cause of the abnormal cells. Make sure you find out the results of the Pap test. If you have not received the results within two weeks, contact your caregiver's office for the results. Do not assume everything is normal if you have not heard from  your caregiver or medical facility. It is important to follow up on all of your test results.  Document Released: 02/08/2003 Document Revised: 07/31/2011 Document Reviewed: 04/11/2008 [employer] Patient Information 2012 [employer]. 

## 2011-10-14 ENCOUNTER — Encounter: Payer: Self-pay | Admitting: Advanced Practice Midwife

## 2011-10-15 ENCOUNTER — Encounter: Payer: Self-pay | Admitting: Advanced Practice Midwife

## 2011-10-15 ENCOUNTER — Ambulatory Visit: Payer: Medicare Other | Admitting: Physical Therapy

## 2011-10-17 ENCOUNTER — Ambulatory Visit: Payer: Medicare Other | Admitting: Physical Therapy

## 2011-10-21 ENCOUNTER — Encounter: Payer: Medicare Other | Admitting: Physical Therapy

## 2011-10-21 ENCOUNTER — Ambulatory Visit (HOSPITAL_COMMUNITY)
Admission: RE | Admit: 2011-10-21 | Discharge: 2011-10-21 | Disposition: A | Discharge status: Home / Self Care | Payer: Medicare Other | Source: Ambulatory Visit | Attending: Internal Medicine | Admitting: Internal Medicine

## 2011-10-21 DIAGNOSIS — Z1231 Encounter for screening mammogram for malignant neoplasm of breast: Secondary | ICD-10-CM

## 2011-10-22 ENCOUNTER — Ambulatory Visit: Payer: Medicare Other | Admitting: Physical Therapy

## 2011-10-23 ENCOUNTER — Encounter: Payer: Medicare Other | Admitting: Physical Therapy

## 2011-10-23 ENCOUNTER — Ambulatory Visit: Payer: Medicare Other | Admitting: Physical Therapy

## 2011-10-28 ENCOUNTER — Ambulatory Visit: Payer: Medicare Other | Admitting: Physical Therapy

## 2011-10-28 ENCOUNTER — Encounter: Payer: Medicare Other | Admitting: Physical Therapy

## 2011-10-30 ENCOUNTER — Encounter: Payer: Medicare Other | Admitting: Physical Therapy

## 2011-11-04 ENCOUNTER — Encounter: Payer: Medicare Other | Admitting: Physical Therapy

## 2011-11-06 ENCOUNTER — Encounter: Payer: Medicare Other | Admitting: Physical Therapy

## 2011-11-08 ENCOUNTER — Ambulatory Visit: Payer: Medicare Other | Attending: Internal Medicine | Admitting: Physical Therapy

## 2011-11-08 DIAGNOSIS — M6281 Muscle weakness (generalized): Secondary | ICD-10-CM | POA: Insufficient documentation

## 2011-11-08 DIAGNOSIS — R269 Unspecified abnormalities of gait and mobility: Secondary | ICD-10-CM | POA: Insufficient documentation

## 2011-11-08 DIAGNOSIS — IMO0001 Reserved for inherently not codable concepts without codable children: Secondary | ICD-10-CM | POA: Insufficient documentation

## 2011-12-12 IMAGING — CR DG ABDOMEN ACUTE W/ 1V CHEST
3 series · 3 of 3 positions shown · non-contrast
Comparison: CT 06/16/2011

CLINICAL DATA: Abdominal pain, nausea.  Question small bowel
obstruction.

ACUTE ABDOMEN SERIES (ABDOMEN 2 VIEW & CHEST 1 VIEW)

[w chest pa]
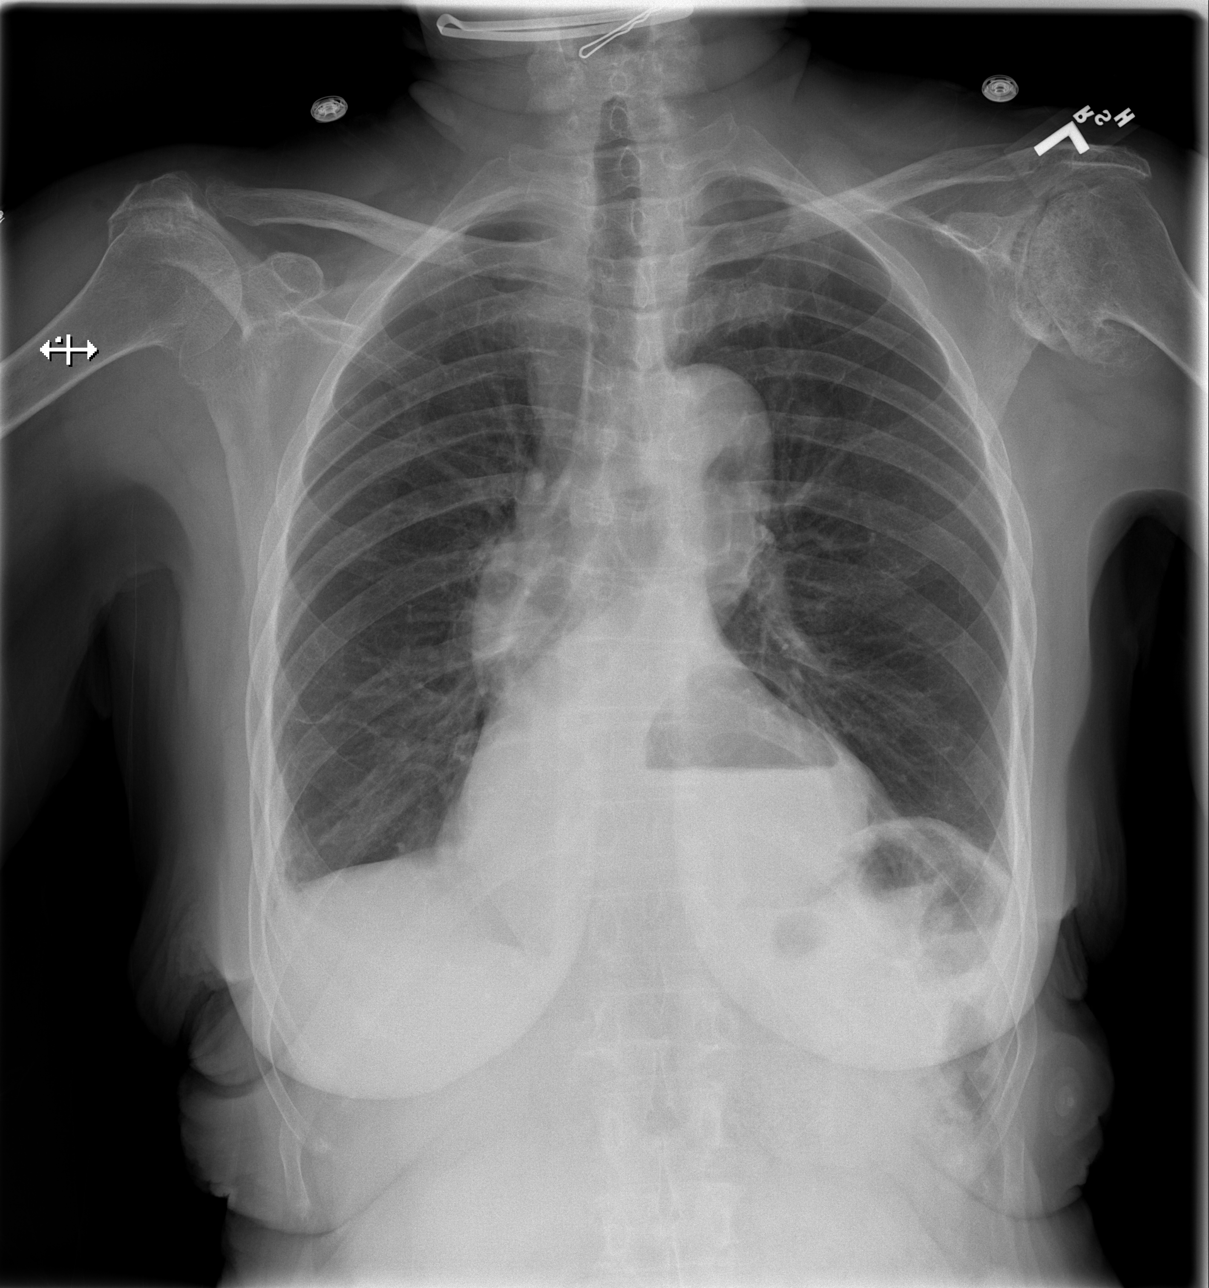

[w abdomen upright]
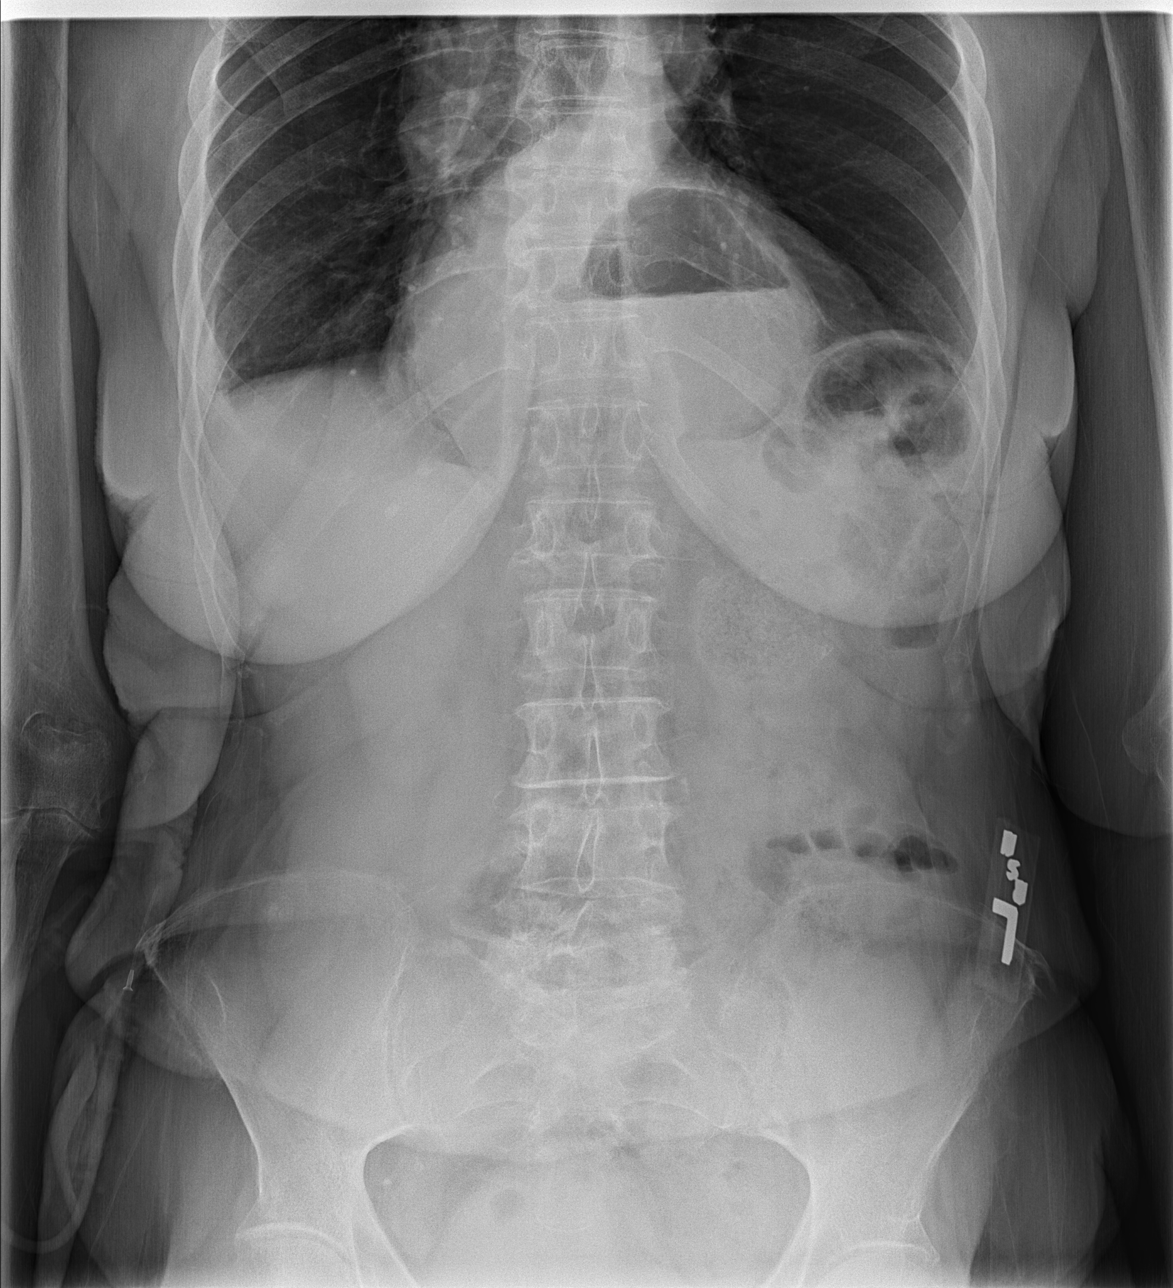

[t abdomen supine]
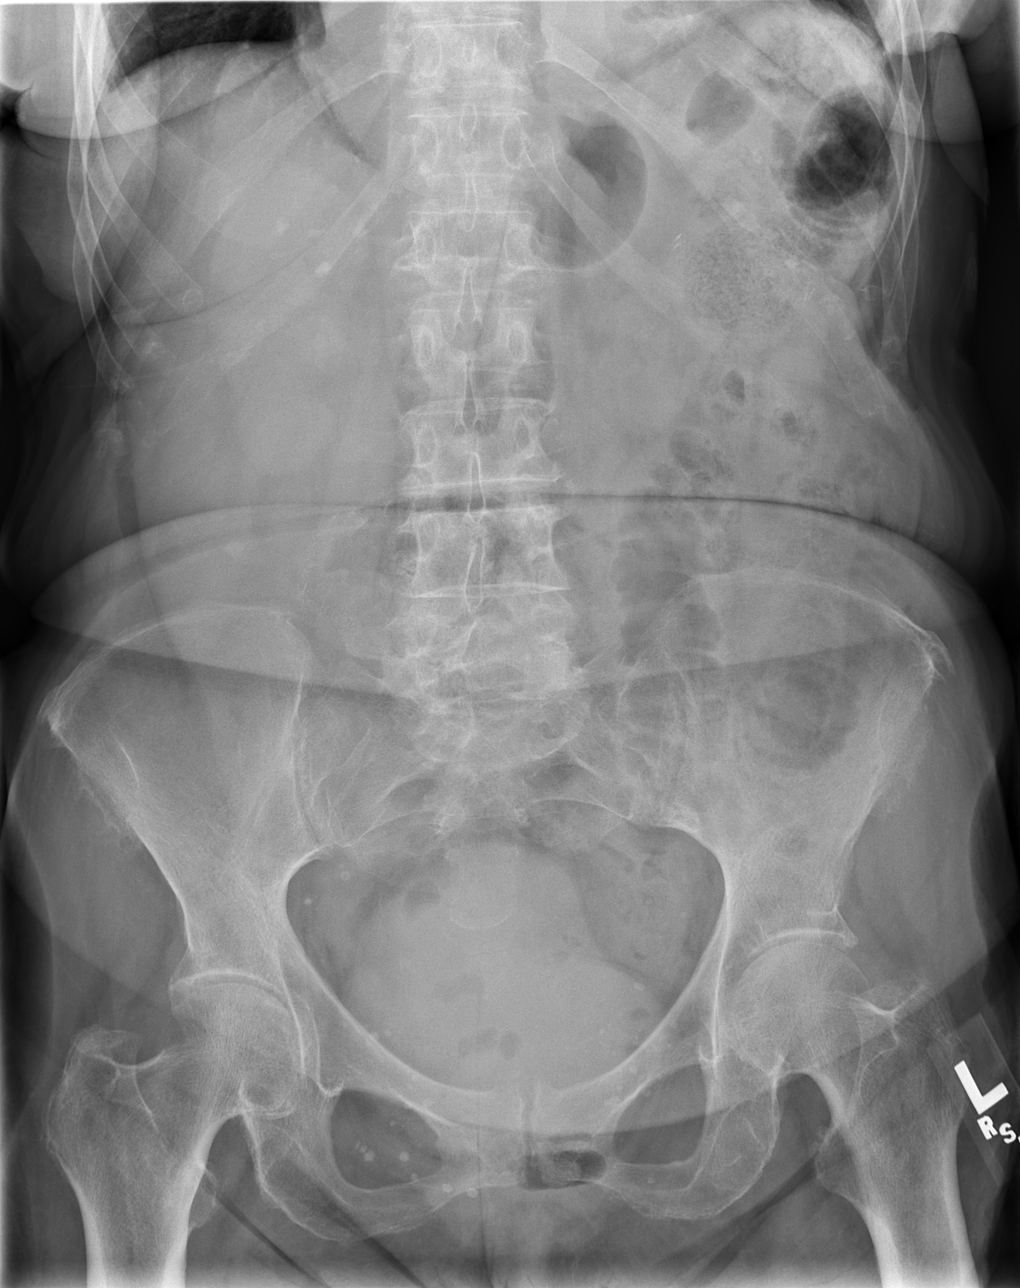

[3 of 3 positions shown; findings below may reference images not displayed]

FINDINGS: There is a large hiatal hernia.  Lungs are clear.  Heart
is upper limits normal in size.  No effusions.  Advanced
degenerative changes in the shoulders.

Mildly prominent left abdominal small bowel loops, similar to prior
CT.  No free air or organomegaly.  Calcified gallstones noted in
the right upper quadrant.  No acute bony abnormality.
IMPRESSION: Mildly dilated left abdominal small bowel loops as seen on prior
CT.

Cholelithiasis.

Large hiatal hernia.

## 2011-12-25 ENCOUNTER — Ambulatory Visit (INDEPENDENT_AMBULATORY_CARE_PROVIDER_SITE_OTHER): Payer: Medicare Other | Admitting: Internal Medicine

## 2011-12-25 ENCOUNTER — Encounter: Payer: Self-pay | Admitting: Internal Medicine

## 2011-12-25 DIAGNOSIS — I10 Essential (primary) hypertension: Secondary | ICD-10-CM

## 2011-12-25 DIAGNOSIS — D509 Iron deficiency anemia, unspecified: Secondary | ICD-10-CM

## 2011-12-25 DIAGNOSIS — N259 Disorder resulting from impaired renal tubular function, unspecified: Secondary | ICD-10-CM

## 2011-12-25 DIAGNOSIS — M109 Gout, unspecified: Secondary | ICD-10-CM

## 2011-12-25 DIAGNOSIS — N289 Disorder of kidney and ureter, unspecified: Secondary | ICD-10-CM

## 2011-12-25 DIAGNOSIS — E559 Vitamin D deficiency, unspecified: Secondary | ICD-10-CM

## 2011-12-25 DIAGNOSIS — K5900 Constipation, unspecified: Secondary | ICD-10-CM

## 2011-12-25 MED ORDER — CALCIUM CARBONATE-VIT D-MIN 600-400 MG-UNIT PO TABS: 1.0000 | ORAL_TABLET | Freq: Two times a day (BID) | ORAL | Status: DC

## 2011-12-25 MED ORDER — VITAMIN D3 25 MCG (1000 UNIT) PO TABS: 1000.0000 [IU] | ORAL_TABLET | Freq: Every day | ORAL | Status: DC

## 2011-12-25 MED ORDER — DOCUSATE SODIUM 100 MG PO CAPS: 100.0000 mg | ORAL_CAPSULE | Freq: Every day | ORAL | Status: DC | PRN

## 2011-12-25 NOTE — Assessment & Plan Note (Signed)
Lab Results  Component Value Date   NA 140 08/07/2011   K 3.9 08/07/2011   CL 101 08/07/2011   CO2 23 08/07/2011   BUN 24* 08/07/2011   CREATININE 1.34* 08/07/2011   CREATININE 0.91 06/18/2011    BP Readings from Last 3 Encounters:  12/25/11 132/70  10/11/11 113/74  08/07/11 122/74    Assessment: Hypertension control:  controlled  Progress toward goals:  at goal Barriers to meeting goals:  no barriers identified  Plan: Hypertension treatment:  continue current medications

## 2011-12-25 NOTE — Assessment & Plan Note (Signed)
Assessment: Patient has had no recent recurrence of gout symptoms.  Plan: Continue allopurinol and colchicine at current dose

## 2011-12-25 NOTE — Assessment & Plan Note (Signed)
Hemoglobin  Date Value Range Status  08/07/2011 11.5* 12.0-15.0 (g/dL) Final     Ferritin  Date Value Range Status  05/22/2011 48  10-291 (ng/mL) Final    Assessment: Patient has no symptoms of significant anemia.  Plan: Plan is to check a CBC with differential and ferritin level today.

## 2011-12-25 NOTE — Progress Notes (Signed)
  Subjective:    Patient ID: [name], female    DOB: 1926/07/15, 78 y.o.   MRN: [id number]  HPI Patient returns for followup of her hypertension, anemia, gout, and other medical problems.  Today she reports occasional soreness of her left shoulder when she first gets up, which improves with range of motion exercise; this is not a continual pain, and once it resolves she has no problem using her left arm or shoulder.  She also reports constipation since she ran out off her Colace.  Otherwise she is doing well.  She reports that she is compliant with her medications.  She reports that she went to physical therapy following her last visit and that her walking and functional status benefit significantly from the physical therapy; she currently denies gait instability.   Review of Systems  Constitutional: Negative for fever, chills, diaphoresis and appetite change.  Respiratory: Negative for cough, chest tightness and shortness of breath.   Cardiovascular: Negative for chest pain and leg swelling.  Gastrointestinal: Positive for constipation. Negative for nausea, vomiting and abdominal pain.  Genitourinary: Negative for dysuria and frequency.  Musculoskeletal: Negative for gait problem.       Objective:   Physical Exam  Constitutional: No distress.  Cardiovascular: Normal rate, regular rhythm and normal heart sounds.  Exam reveals no gallop and no friction rub.   No murmur heard.      No leg edema..  Pulmonary/Chest: Effort normal and breath sounds normal. She has no wheezes. She has no rales.  Abdominal: Soft. Bowel sounds are normal. She exhibits no distension. There is no hepatosplenomegaly. There is no tenderness. There is no rebound and no guarding.        Assessment & Plan:

## 2011-12-25 NOTE — Patient Instructions (Signed)
Call if constipation does not improve on the stool softener.

## 2011-12-25 NOTE — Assessment & Plan Note (Signed)
Assessment: Patient is currently out on her vitamin D supplementation.  Plan: Plan is to refill her vitamin D supplement and check a vitamin D level

## 2011-12-25 NOTE — Assessment & Plan Note (Signed)
  Component Value Date/Time   CREATININE 1.34* 08/07/2011 1124   CREATININE 0.91 06/18/2011 0500   CREATININE 1.23* 06/17/2011 0500     Assessment: Patient's creatinine has been relatively stable with some fluctuation.  Plan: Will check a metabolic panel today today.

## 2011-12-25 NOTE — Assessment & Plan Note (Signed)
Assessment: Patient reports that she did well when taking Colace, but she ran out of that medication and has had a recurrence of constipation.    Plan:  The plan is to refill Colace; I advised her to let me know if her symptoms do not resolve.

## 2011-12-26 LAB — CBC WITH DIFFERENTIAL/PLATELET
Basophils Absolute: 0 10*3/uL (ref 0.0–0.1)
HCT: 36.3 % (ref 36.0–46.0)
Hemoglobin: 11.8 g/dL — ABNORMAL LOW (ref 12.0–15.0)
Lymphocytes Relative: 29 % (ref 12–46)
Monocytes Absolute: 0.5 10*3/uL (ref 0.1–1.0)
Monocytes Relative: 8 % (ref 3–12)
Neutro Abs: 3.5 10*3/uL (ref 1.7–7.7)
RDW: 16.4 % — ABNORMAL HIGH (ref 11.5–15.5)
WBC: 5.5 10*3/uL (ref 4.0–10.5)

## 2011-12-26 LAB — COMPLETE METABOLIC PANEL WITH GFR
Alkaline Phosphatase: 131 U/L — ABNORMAL HIGH (ref 39–117)
BUN: 25 mg/dL — ABNORMAL HIGH (ref 6–23)
GFR, Est Non African American: 30 mL/min — ABNORMAL LOW
Glucose, Bld: 86 mg/dL (ref 70–99)
Total Bilirubin: 0.5 mg/dL (ref 0.3–1.2)

## 2011-12-26 LAB — FERRITIN: Ferritin: 23 ng/mL (ref 10–291)

## 2011-12-31 ENCOUNTER — Other Ambulatory Visit: Payer: Self-pay | Admitting: Internal Medicine

## 2012-01-08 ENCOUNTER — Ambulatory Visit: Payer: Medicare Other | Admitting: Internal Medicine

## 2012-03-30 ENCOUNTER — Other Ambulatory Visit: Payer: Self-pay | Admitting: Internal Medicine

## 2012-04-01 ENCOUNTER — Ambulatory Visit: Payer: Medicare Other | Admitting: Internal Medicine

## 2012-04-10 ENCOUNTER — Other Ambulatory Visit: Payer: Self-pay | Admitting: Internal Medicine

## 2012-04-29 ENCOUNTER — Encounter: Payer: Self-pay | Admitting: Internal Medicine

## 2012-04-29 ENCOUNTER — Ambulatory Visit (INDEPENDENT_AMBULATORY_CARE_PROVIDER_SITE_OTHER): Payer: Medicare Other | Admitting: Internal Medicine

## 2012-04-29 VITALS — BP 180/100 | HR 80 | Temp 97.0°F | Ht <= 58 in | Wt 126.9 lb

## 2012-04-29 DIAGNOSIS — D509 Iron deficiency anemia, unspecified: Secondary | ICD-10-CM

## 2012-04-29 DIAGNOSIS — M899 Disorder of bone, unspecified: Secondary | ICD-10-CM

## 2012-04-29 DIAGNOSIS — I10 Essential (primary) hypertension: Secondary | ICD-10-CM

## 2012-04-29 DIAGNOSIS — M109 Gout, unspecified: Secondary | ICD-10-CM

## 2012-04-29 DIAGNOSIS — E559 Vitamin D deficiency, unspecified: Secondary | ICD-10-CM

## 2012-04-29 DIAGNOSIS — N259 Disorder resulting from impaired renal tubular function, unspecified: Secondary | ICD-10-CM

## 2012-04-29 LAB — COMPLETE METABOLIC PANEL WITH GFR
ALT: 9 U/L (ref 0–35)
AST: 21 U/L (ref 0–37)
CO2: 25 mEq/L (ref 19–32)
GFR, Est African American: 47 mL/min — ABNORMAL LOW
Sodium: 141 mEq/L (ref 135–145)
Total Bilirubin: 0.6 mg/dL (ref 0.3–1.2)
Total Protein: 7.2 g/dL (ref 6.0–8.3)

## 2012-04-29 LAB — CBC WITH DIFFERENTIAL/PLATELET
Basophils Relative: 1 % (ref 0–1)
Eosinophils Absolute: 0 10*3/uL (ref 0.0–0.7)
Eosinophils Relative: 0 % (ref 0–5)
HCT: 32.1 % — ABNORMAL LOW (ref 36.0–46.0)
Hemoglobin: 10.8 g/dL — ABNORMAL LOW (ref 12.0–15.0)
MCH: 28.6 pg (ref 26.0–34.0)
MCHC: 33.6 g/dL (ref 30.0–36.0)
MCV: 85.1 fL (ref 78.0–100.0)
Monocytes Absolute: 0.6 10*3/uL (ref 0.1–1.0)
Monocytes Relative: 12 % (ref 3–12)
Neutrophils Relative %: 57 % (ref 43–77)

## 2012-04-29 LAB — LIPID PANEL
LDL Cholesterol: 111 mg/dL — ABNORMAL HIGH (ref 0–99)
Total CHOL/HDL Ratio: 2.8 Ratio
VLDL: 19 mg/dL (ref 0–40)

## 2012-04-29 MED ORDER — DILTIAZEM HCL ER BEADS 420 MG PO CP24: 420.0000 mg | ORAL_CAPSULE | Freq: Every day | ORAL | Status: DC

## 2012-04-29 MED ORDER — COLCHICINE 0.6 MG PO TABS: 0.6000 mg | ORAL_TABLET | Freq: Every day | ORAL | Status: DC

## 2012-04-29 MED ORDER — FERROUS GLUCONATE 225 (27 FE) MG PO TABS: 240.0000 mg | ORAL_TABLET | Freq: Two times a day (BID) | ORAL | Status: DC

## 2012-04-29 MED ORDER — VITAMIN D3 25 MCG (1000 UNIT) PO TABS: 1000.0000 [IU] | ORAL_TABLET | Freq: Every day | ORAL | Status: DC

## 2012-04-29 NOTE — Patient Instructions (Signed)
Please refill your diltiazem 420 mg and take once daily (this is for high blood pressure). Please refill your vitamin D 1000 unit tablets and take one daily. Please refill your calcium with vitamin D tablets and take one twice a day. Please start ferrous gluconate 225 mg one tablet twice a day (this is an iron supplement).

## 2012-04-29 NOTE — Progress Notes (Signed)
  Subjective:    Patient ID: [name], female    DOB: 12-04-1925, 78 y.o.   MRN: [id number]  HPI Patient returns for followup of her hypertension, gout, chronic renal insufficiency, and other chronic medical problems.  She reports that she has been doing well, without any acute complaints.  She brought her medications to clinic but these did not include her diltiazem; she apparently ran out of the diltiazem following her last visit and has not had it refilled.  She also has run out of her vitamin D and calcium with vitamin D.   Review of Systems  Constitutional: Negative for fever, chills and appetite change.  Respiratory: Negative for shortness of breath.   Cardiovascular: Positive for leg swelling (Occasional mild ankle edema.). Negative for chest pain.  Gastrointestinal: Negative for nausea, vomiting, abdominal pain and blood in stool.  Genitourinary: Negative for dysuria.  Musculoskeletal: Negative for myalgias and arthralgias.       Objective:   Physical Exam  Constitutional: No distress.  Cardiovascular: Normal rate, regular rhythm and normal heart sounds.  Exam reveals no gallop and no friction rub.   No murmur heard. Pulmonary/Chest: Effort normal and breath sounds normal. No respiratory distress. She has no wheezes. She has no rales.  Abdominal: Soft. Bowel sounds are normal. She exhibits no distension. There is no tenderness. There is no rebound.  Musculoskeletal: She exhibits no edema.       Assessment & Plan:

## 2012-04-29 NOTE — Assessment & Plan Note (Signed)
Hemoglobin  Date Value Range Status  12/25/2011 11.8* 12.0-15.0 (g/dL) Final     Ferritin  Date Value Range Status  12/25/2011 23  10-291 (ng/mL) Final     Assessment: Patient reports mild chronic fatigue with no recent change.  Her last hemoglobin was mildly low in January of this year.  She has not been taking iron supplementation.  Plan: Will check a CBC with differential today; start ferrous gluconate twice a day for oral replacement.

## 2012-04-29 NOTE — Assessment & Plan Note (Signed)
Creat  Date Value Range Status  12/25/2011 1.57* 0.50-1.10 (mg/dL) Final    Assessment: Patient has relatively stable chronic renal insufficiency.  Plan: Will check a metabolic panel today.

## 2012-04-29 NOTE — Assessment & Plan Note (Signed)
Assessment: Patient has recently been out of her vitamin D supplementation.  Plan: I refilled vitamin D 1,000 units daily and I instructed patient to resume this medication.

## 2012-04-29 NOTE — Assessment & Plan Note (Signed)
Assessment: Patient has been out off her calcium and vitamin D supplement, as well as her vitamin D.  Plan: I refilled both medications today and advised patient to resume taking calcium with vitamin D and her vitamin D supplement.

## 2012-04-29 NOTE — Assessment & Plan Note (Signed)
Assessment: Patient has had no recent gout flares , and is doing well on her current regimen of allopurinol and colchicine.  Plan: Continue current medications.

## 2012-04-29 NOTE — Assessment & Plan Note (Signed)
Lab Results  Component Value Date   NA 137 12/25/2011   K 4.1 12/25/2011   CL 100 12/25/2011   CO2 26 12/25/2011   BUN 25* 12/25/2011   CREATININE 1.57* 12/25/2011    BP Readings from Last 3 Encounters:  04/29/12 180/100  12/25/11 132/70  10/11/11 113/74    Assessment: Hypertension control:  moderately elevated due to the patient's having run out of diltiazem Progress toward goals:  deteriorated Barriers to meeting goals:  nonadherence to medications  Plan: I advised patient to pick up her diltiazem refill and continue her current medication regimen.  Will recheck blood pressure upon return.

## 2012-07-01 ENCOUNTER — Ambulatory Visit: Payer: Medicare Other | Admitting: Internal Medicine

## 2012-07-29 ENCOUNTER — Other Ambulatory Visit: Payer: Self-pay | Admitting: Internal Medicine

## 2012-08-05 ENCOUNTER — Ambulatory Visit (INDEPENDENT_AMBULATORY_CARE_PROVIDER_SITE_OTHER): Payer: Medicare Other | Admitting: Internal Medicine

## 2012-08-05 ENCOUNTER — Ambulatory Visit: Payer: Medicare Other | Admitting: Internal Medicine

## 2012-08-05 ENCOUNTER — Encounter: Payer: Self-pay | Admitting: Internal Medicine

## 2012-08-05 VITALS — BP 138/88 | HR 80 | Temp 97.0°F | Ht <= 58 in | Wt 124.0 lb

## 2012-08-05 DIAGNOSIS — M109 Gout, unspecified: Secondary | ICD-10-CM

## 2012-08-05 DIAGNOSIS — D509 Iron deficiency anemia, unspecified: Secondary | ICD-10-CM

## 2012-08-05 DIAGNOSIS — I10 Essential (primary) hypertension: Secondary | ICD-10-CM

## 2012-08-05 DIAGNOSIS — N259 Disorder resulting from impaired renal tubular function, unspecified: Secondary | ICD-10-CM

## 2012-08-05 LAB — COMPLETE METABOLIC PANEL WITH GFR
ALT: 9 U/L (ref 0–35)
AST: 21 U/L (ref 0–37)
Albumin: 4.6 g/dL (ref 3.5–5.2)
Alkaline Phosphatase: 108 U/L (ref 39–117)
Calcium: 9.7 mg/dL (ref 8.4–10.5)
Chloride: 103 mEq/L (ref 96–112)
Potassium: 4 mEq/L (ref 3.5–5.3)
Sodium: 140 mEq/L (ref 135–145)

## 2012-08-05 MED ORDER — FERROUS GLUCONATE 225 (27 FE) MG PO TABS: 240.0000 mg | ORAL_TABLET | Freq: Two times a day (BID) | ORAL | Status: DC

## 2012-08-05 MED ORDER — COLCHICINE 0.6 MG PO TABS: 0.6000 mg | ORAL_TABLET | Freq: Every day | ORAL | Status: DC

## 2012-08-05 MED ORDER — DOCUSATE SODIUM 100 MG PO CAPS: 100.0000 mg | ORAL_CAPSULE | Freq: Every day | ORAL | Status: DC | PRN

## 2012-08-05 MED ORDER — VITAMIN D3 25 MCG (1000 UNIT) PO TABS: 1000.0000 [IU] | ORAL_TABLET | Freq: Every day | ORAL | Status: DC

## 2012-08-05 MED ORDER — CALCIUM CARBONATE-VIT D-MIN 600-400 MG-UNIT PO TABS: 1.0000 | ORAL_TABLET | Freq: Two times a day (BID) | ORAL | Status: DC

## 2012-08-05 NOTE — Assessment & Plan Note (Signed)
Lab Results  Component Value Date   WBC 5.2 04/29/2012   HGB 10.8* 04/29/2012   HCT 32.1* 04/29/2012   MCV 85.1 04/29/2012   PLT 316 04/29/2012   Ferritin  Date Value Range Status  12/25/2011 23  10 - 291 ng/mL Final     Assessment: Patient has no symptoms of anemia; her last hemoglobin was mildly low.  Plan: Refill ferrous gluconate and continue; recheck CBC and ferritin at next visit.

## 2012-08-05 NOTE — Assessment & Plan Note (Signed)
Assessment: Patient has had no recent gout flares.  She is doing well with no apparent side effects of her medications.  Plan: Continue allopurinol 200 mg daily and colchicine 0.6 mg daily.

## 2012-08-05 NOTE — Patient Instructions (Signed)
Continue current medications. 

## 2012-08-05 NOTE — Assessment & Plan Note (Signed)
Lab Results  Component Value Date   NA 141 04/29/2012   K 4.0 04/29/2012   CL 104 04/29/2012   CO2 25 04/29/2012   BUN 27* 04/29/2012   CREATININE 1.21* 04/29/2012    BP Readings from Last 3 Encounters:  08/05/12 138/88  04/29/12 180/100  12/25/11 132/70    Assessment: Hypertension control:  controlled  Progress toward goals:  at goal Barriers to meeting goals:  no barriers identified  Plan: Hypertension treatment:  continue current medications (diltiazem 24-hour capsule 420 mg daily, losartan 50 mg daily)

## 2012-08-05 NOTE — Assessment & Plan Note (Signed)
  Component Value Date/Time   CREATININE 1.21* 04/29/2012 1207   CREATININE 1.57* 12/25/2011 1123   CREATININE 1.34* 08/07/2011 1124    Assessment: Patient's last creatinine had improved to near baseline.  Plan: Will check a comprehensive metabolic panel today to

## 2012-08-05 NOTE — Progress Notes (Signed)
  Subjective:    Patient ID: [name], female    DOB: 11-May-1926, 78 y.o.   MRN: [id number]  HPI Patient returns for followup of her hypertension, renal insufficiency, and other medical problems.  She has no acute complaints today, and reports that she has been doing well.  She reports that she has been compliant with her medications.  She has had no recent gout symptoms.   Review of Systems  Constitutional: Negative for fever and chills.  Respiratory: Negative for shortness of breath.   Cardiovascular: Negative for chest pain and leg swelling.  Gastrointestinal: Negative for nausea, vomiting, abdominal pain and blood in stool.  Musculoskeletal: Negative for joint swelling and arthralgias.       Objective:   Physical Exam  Constitutional: No distress.  Cardiovascular: Normal rate, regular rhythm and normal heart sounds.  Exam reveals no gallop and no friction rub.   No murmur heard. Pulmonary/Chest: Effort normal and breath sounds normal. No respiratory distress. She has no wheezes. She has no rales.  Abdominal: Soft. Bowel sounds are normal. She exhibits no distension. There is no tenderness. There is no guarding.  Musculoskeletal: She exhibits no edema.       Assessment & Plan:

## 2012-08-07 ENCOUNTER — Telehealth: Payer: Self-pay | Admitting: Internal Medicine

## 2012-08-07 DIAGNOSIS — N259 Disorder resulting from impaired renal tubular function, unspecified: Secondary | ICD-10-CM

## 2012-08-07 NOTE — Telephone Encounter (Signed)
  Component Value Date/Time   CREATININE 1.98* 08/05/2012 1200   CREATININE 1.21* 04/29/2012 1207   CREATININE 1.57* 12/25/2011 1123   Patient's creatinine done 9//03/2012 was elevated at 1.98.  Her creatinine has fluctuated to some extent in the past.  I called and discussed this with her today.  She is not taking any over-the-counter NSAID medications, and reports no symptoms that would suggest volume depletion.  The plan is to have her come in next Wednesday for a repeat basic metabolic panel, as well as a urinalysis and urine microalbumin-to-creatinine ratio.

## 2012-08-07 NOTE — Assessment & Plan Note (Signed)
  Component Value Date/Time   CREATININE 1.98* 08/05/2012 1200   CREATININE 1.21* 04/29/2012 1207   CREATININE 1.57* 12/25/2011 1123   Patient's creatinine done 9//03/2012 was elevated at 1.98.  Her creatinine has fluctuated to some extent in the past.  I called and discussed this with her today.  She is not taking any over-the-counter NSAID medications, and reports no symptoms that would suggest volume depletion.  The plan is to have her come in next Wednesday for a repeat basic metabolic panel, as well as a urinalysis and urine microalbumin-to-creatinine ratio. 

## 2012-08-12 ENCOUNTER — Other Ambulatory Visit (INDEPENDENT_AMBULATORY_CARE_PROVIDER_SITE_OTHER): Payer: Medicare Other

## 2012-08-12 DIAGNOSIS — N259 Disorder resulting from impaired renal tubular function, unspecified: Secondary | ICD-10-CM

## 2012-08-12 LAB — BASIC METABOLIC PANEL WITH GFR
BUN: 38 mg/dL — ABNORMAL HIGH (ref 6–23)
Chloride: 103 mEq/L (ref 96–112)
Creat: 1.96 mg/dL — ABNORMAL HIGH (ref 0.50–1.10)
GFR, Est African American: 26 mL/min — ABNORMAL LOW
GFR, Est Non African American: 23 mL/min — ABNORMAL LOW

## 2012-08-13 ENCOUNTER — Telehealth: Payer: Self-pay | Admitting: Internal Medicine

## 2012-08-13 DIAGNOSIS — N259 Disorder resulting from impaired renal tubular function, unspecified: Secondary | ICD-10-CM

## 2012-08-13 LAB — URINALYSIS, ROUTINE W REFLEX MICROSCOPIC
Hgb urine dipstick: NEGATIVE
Ketones, ur: NEGATIVE mg/dL
Nitrite: NEGATIVE
pH: 5 (ref 5.0–8.0)

## 2012-08-13 LAB — URINALYSIS, MICROSCOPIC ONLY: Casts: NONE SEEN

## 2012-08-13 LAB — MICROALBUMIN / CREATININE URINE RATIO: Microalb Creat Ratio: 6.5 mg/g (ref 0.0–30.0)

## 2012-08-13 NOTE — Telephone Encounter (Signed)
Creat  Date Value Range Status  08/12/2012 1.96* 0.50 - 1.10 mg/dL Final   Patient's creatinine on recheck was 1.96, stable compared to last week but elevated compared to prior values this year.  Patient has chronic renal insufficiency with past fluctuations in her creatinine.  Her urinalysis showed white blood cells but also many squamous epithelial cells suggesting a poor quality specimen.  I called patient at home today and discussed findings with her.  She has no urinary symptoms, and reports that she is feeling well.  She reports that she does not take any over-the-counter analgesics such as NSAID medications or other over-the-counter medications.  Her chronic renal insufficiency may be hypertensive nephropathy.  Given the elevation of her creatinine compared to previous values this year, the plan is to refer her to nephrology for evaluation.  I discussed this with patient, and she is in agreement.

## 2012-08-13 NOTE — Progress Notes (Signed)
Quick Note:  Creat Date Value Range Status 08/12/2012 1.96* 0.50 - 1.10 mg/dL Final  Patient's creatinine on recheck was 1.96, stable compared to last week but elevated compared to prior values this year. Patient has chronic renal insufficiency with past fluctuations in her creatinine. Her urinalysis showed white blood cells but also many squamous epithelial cells suggesting a poor quality specimen. I called patient at home today and discussed findings with her. She has no urinary symptoms, and reports that she is feeling well. She reports that she does not take any over-the-counter analgesics such as NSAID medications or other over-the-counter medications. Her chronic renal insufficiency may be hypertensive nephropathy. Given the elevation of her creatinine compared to previous values this year, the plan is to refer her to nephrology for evaluation. I discussed this with patient, and she is in agreement. ______

## 2012-08-13 NOTE — Assessment & Plan Note (Signed)
Creat  Date Value Range Status  08/12/2012 1.96* 0.50 - 1.10 mg/dL Final   Patient's creatinine on recheck was 1.96, stable compared to last week but elevated compared to prior values this year.  Patient has chronic renal insufficiency with past fluctuations in her creatinine.  Her urinalysis showed white blood cells but also many squamous epithelial cells suggesting a poor quality specimen.  I called patient at home today and discussed findings with her.  She has no urinary symptoms, and reports that she is feeling well.  She reports that she does not take any over-the-counter analgesics such as NSAID medications or other over-the-counter medications.  Her chronic renal insufficiency may be hypertensive nephropathy.  Given the elevation of her creatinine compared to previous values this year, the plan is to refer her to nephrology for evaluation.  I discussed this with patient, and she is in agreement. 

## 2012-08-26 ENCOUNTER — Other Ambulatory Visit: Payer: Self-pay | Admitting: Internal Medicine

## 2012-10-26 ENCOUNTER — Other Ambulatory Visit: Payer: Self-pay | Admitting: Internal Medicine

## 2012-12-09 ENCOUNTER — Ambulatory Visit (INDEPENDENT_AMBULATORY_CARE_PROVIDER_SITE_OTHER): Payer: Medicare Other | Admitting: Internal Medicine

## 2012-12-09 ENCOUNTER — Encounter: Payer: Self-pay | Admitting: Internal Medicine

## 2012-12-09 VITALS — BP 122/85 | HR 80 | Temp 97.8°F | Ht <= 58 in | Wt 115.3 lb

## 2012-12-09 DIAGNOSIS — D509 Iron deficiency anemia, unspecified: Secondary | ICD-10-CM

## 2012-12-09 DIAGNOSIS — E559 Vitamin D deficiency, unspecified: Secondary | ICD-10-CM

## 2012-12-09 DIAGNOSIS — Z23 Encounter for immunization: Secondary | ICD-10-CM

## 2012-12-09 DIAGNOSIS — R634 Abnormal weight loss: Secondary | ICD-10-CM

## 2012-12-09 DIAGNOSIS — R7309 Other abnormal glucose: Secondary | ICD-10-CM

## 2012-12-09 DIAGNOSIS — M949 Disorder of cartilage, unspecified: Secondary | ICD-10-CM

## 2012-12-09 DIAGNOSIS — N259 Disorder resulting from impaired renal tubular function, unspecified: Secondary | ICD-10-CM

## 2012-12-09 DIAGNOSIS — M899 Disorder of bone, unspecified: Secondary | ICD-10-CM

## 2012-12-09 DIAGNOSIS — R739 Hyperglycemia, unspecified: Secondary | ICD-10-CM | POA: Insufficient documentation

## 2012-12-09 DIAGNOSIS — I10 Essential (primary) hypertension: Secondary | ICD-10-CM

## 2012-12-09 LAB — COMPLETE METABOLIC PANEL WITH GFR
Albumin: 4.5 g/dL (ref 3.5–5.2)
Alkaline Phosphatase: 119 U/L — ABNORMAL HIGH (ref 39–117)
GFR, Est Non African American: 31 mL/min — ABNORMAL LOW
Glucose, Bld: 90 mg/dL (ref 70–99)
Potassium: 4 mEq/L (ref 3.5–5.3)
Sodium: 140 mEq/L (ref 135–145)
Total Protein: 7 g/dL (ref 6.0–8.3)

## 2012-12-09 LAB — CBC WITH DIFFERENTIAL/PLATELET
Eosinophils Absolute: 0 10*3/uL (ref 0.0–0.7)
Eosinophils Relative: 0 % (ref 0–5)
HCT: 34.7 % — ABNORMAL LOW (ref 36.0–46.0)
Hemoglobin: 11.9 g/dL — ABNORMAL LOW (ref 12.0–15.0)
Lymphs Abs: 1.7 10*3/uL (ref 0.7–4.0)
MCH: 29.8 pg (ref 26.0–34.0)
MCV: 87 fL (ref 78.0–100.0)
Monocytes Absolute: 0.5 10*3/uL (ref 0.1–1.0)
Monocytes Relative: 10 % (ref 3–12)
Platelets: 253 10*3/uL (ref 150–400)
RBC: 3.99 MIL/uL (ref 3.87–5.11)

## 2012-12-09 NOTE — Assessment & Plan Note (Signed)
Lab Results  Component Value Date   HGB 10.8* 04/29/2012   FERRITIN 23 12/25/2011     Assessment: Patient has no symptoms of anemia.  She has been out of ferrous gluconate.  Plan: Will check a CBC with differential and ferritin level today; refill ferrous gluconate.

## 2012-12-09 NOTE — Assessment & Plan Note (Addendum)
Assessment: Patient's last vitamin D level was 22 when checked in January of 2013.  She has recently been out of her vitamin D supplementation.  Plan: Will check a vitamin D level today.  I advised patient to refill her vitamin D capsules and to take them as prescribed.

## 2012-12-09 NOTE — Assessment & Plan Note (Signed)
BP Readings from Last 3 Encounters:  12/09/12 122/85  08/05/12 138/88  04/29/12 180/100    Lab Results  Component Value Date   NA 138 08/12/2012   K 3.8 08/12/2012   CREATININE 1.96* 08/12/2012    Assessment:  Blood pressure control: controlled  Progress toward BP goal:  at goal  Comments: Blood pressure is well controlled on current regimen of diltiazem 24-hour capsule 420 mg daily and losartan 50 mg daily.  Plan:  Medications:  continue current medications  Educational resources provided: brochure  Self management tools provided: home blood pressure logbook  Other plans: Nephrology referral as noted.

## 2012-12-09 NOTE — Assessment & Plan Note (Signed)
Assessment: Patient's weight has gradually declined over the past 4 years, and has fluctuated over the past 1-2 years.  She reports a good appetite, but also reports that she does not eat large meals.  She has a history of a malignant colonic polyp and is status post a right hemicolectomy in 1991; she has had repeated surveillance colonoscopies over the years, last done in March of 2009 by Dr. Juanda Chance who did not plan further surveillance given her age.  A mammogram and Pap smear were negative in November of 2012.  Plan: Will check labs as noted above as well as a TSH level.  I advised patient to be intentional about eating regular meals and to increase her snacking.  If she loses further weight, will consider further workup including CT scan of the abdomen and pelvis.

## 2012-12-09 NOTE — Assessment & Plan Note (Addendum)
Lab Results  Component Value Date   CREATININE 1.96* 08/12/2012   CREATININE 1.98* 08/05/2012   CREATININE 1.21* 04/29/2012   CREATININE 1.57* 12/25/2011   CREATININE 1.34* 08/07/2011   CREATININE 0.91 06/18/2011   CREATININE 1.23* 06/17/2011   CREATININE 1.49* 06/16/2011     Assessment: As previously noted, patient's renal function had declined in September of 2013 and a nephrology referral was made at that time.  Apparently an appointment was made but patient was not able to get transportation to the appointment.  Plan: Will check a comprehensive metabolic panel today.  We requested that nephrology contact patient and reschedule her appointment, and we have advised patient and family of the importance of keeping her appointment.

## 2012-12-09 NOTE — Progress Notes (Signed)
Subjective:    Patient ID: [name], female    DOB: September 21, 1926, 79 y.o.   MRN: [id number]  HPI Patient returns for followup of her renal insufficiency, hypertension, and other chronic medical problems.  She has no new complaints today, and reports that she has done well since her last clinic visit.  She apparently had an appointment scheduled with nephrology but was unable to get transportation to the appointment; we have requested that this be rescheduled.  She is currently out of her vitamin D supplement, ferrous gluconate, famotidine, calcium with vitamin D, and Colace.  She has lost a few pounds since her last clinic visit here; she reports a good appetite and says that she has not been skipping meals but she does not eat large meals.  She has had no recent gout symptoms.  Current Outpatient Prescriptions on File Prior to Visit  Medication Sig Dispense Refill  . allopurinol (ZYLOPRIM) 100 MG tablet Take 2 tablets (200 mg total) by mouth daily.  62 tablet  9  . colchicine (COLCRYS) 0.6 MG tablet Take 1 tablet (0.6 mg total) by mouth daily.  30 tablet  6  . diltiazem (TIAZAC) 420 MG 24 hr capsule Take 1 capsule (420 mg total) by mouth daily.  30 capsule  11  . losartan (COZAAR) 50 MG tablet TAKE 1 TABLET (50 MG TOTAL) BY MOUTH DAILY.  31 tablet  6  . Calcium Carbonate-Vit D-Min 600-400 MG-UNIT TABS Take 1 tablet by mouth 2 (two) times daily.  60 each  11  . [employer] VITAMIN D3 1000 UNITS capsule TAKE 1 (1,000 UNITS TOTAL) BY MOUTH DAILY.  30 capsule  3  . docusate sodium (COLACE) 100 MG capsule Take 1 capsule (100 mg total) by mouth daily as needed.  30 capsule  11  . famotidine (PEPCID) 20 MG tablet TAKE 1 TABLET (20 MG TOTAL) BY MOUTH DAILY.  31 tablet  6  . ferrous gluconate (FERGON) 225 (27 FE) MG tablet Take 1 tablet (240 mg total) by mouth 2 (two) times daily with a meal.  60 each  3    Allergies as of 12/09/2012  . (No Known Allergies)    Past Medical History  Diagnosis Date  .  Hypertension   . Renal insufficiency   . Iron deficiency anemia   . Gout   . Osteopenia   . Vitamin D deficiency     Vitamin D (25-Hydroxy) <10 ng/mL on 12/07/2009.  . Diverticulosis of colon   . Malignant neoplasm of colon 1991    S/P right hemicolectomy in 1991 by Dr. Jamey Ripa, reportedly done for a malignant polyp; colonoscopy by Dr. [name] 02/09/2008 showed  marked left colon diverticulosis with partial narrowing, S/P  remote right hemicolectomy.  . Adenomatous colon polyp   . Lower GI bleed   . Gastric ulcer 1991    H. pylori positive in 1991; EGD 01/31/00 by Dr. Juanda Chance showed mild benign distal esophageal stricture (dilator passed), gastritis with evidence of coffee-ground material, and a 4 cm non-reducible hiatal hernia; EGD  02/09/2008 showed hiatal hernia, and mild benign distal espohageal stricture, was otherwise normal.  . Elevated alkaline phosphatase level     Chronic; GGT was normal (30) on 09/18/2004.  [name] Fatigue   . Constipation   . Tinnitus, bilateral     Audiological evaluation on February 05, 2007 showed a slight to mild loss of hearing in both ears through 4000 Hz, with a moderate to severe precipitous drop in the highest  frequencies (6000 to 8000 Hz); her speech recognition abilities were within normal limits and a slightly elevated conversational level.  . Sensorineural hearing loss of both ears     Audiological evaluation on February 05, 2007 showed a slight to mild loss in both ears through 4000 Hz, with a moderate to severe precipitous drop in the highest frequencies (6000 to 8000 Hz).  . Endometrial polyp     S/P operative hysteroscopy with removal of polyps, dilatation and curettage on 10/20/2000 by Dr. Antionette Char.  [name] Unsteady gait 08/07/2011    Past Surgical History  Procedure Date  . Hemicolectomy 1991    Right -  with ileocolic anastamosis by Dr. Jamey Ripa  . Hysteroscopy     S/P operative hysteroscopy with removal of polyps, dilatation and curettage on  10/20/2000 by Dr. Antionette Char.    Review of Systems  Constitutional: Negative for fever, chills and diaphoresis.  Eyes: Negative for visual disturbance.  Respiratory: Negative for shortness of breath.   Cardiovascular: Negative for chest pain and leg swelling.  Gastrointestinal: Negative for nausea, vomiting and abdominal pain.  Genitourinary: Negative for dysuria and frequency.  Musculoskeletal: Negative for myalgias and arthralgias.  Neurological: Negative for weakness and numbness.       Objective:   Physical Exam  Constitutional: No distress.  Cardiovascular: Normal rate, regular rhythm and normal heart sounds.  Exam reveals no gallop and no friction rub.   No murmur heard. Pulmonary/Chest: Effort normal and breath sounds normal. No respiratory distress. She has no wheezes. She has no rales.  Abdominal: Soft. Bowel sounds are normal. She exhibits no distension and no mass. There is no hepatosplenomegaly. There is no tenderness. There is no rebound and no guarding.  Musculoskeletal: She exhibits no edema.       Assessment & Plan:

## 2012-12-09 NOTE — Patient Instructions (Addendum)
General Instructions: Please keep appointment with the kidney specialist for evaluation of renal insufficiency.   Treatment Goals:  Goals (1 Years of Data) as of 12/09/2012          As of Today 08/05/12 04/29/12 12/25/11 12/25/11     Blood Pressure    . Blood Pressure < 140/90  122/85 138/88 180/100 132/70 143/91      Progress Toward Treatment Goals:  Treatment Goal 12/09/2012  Blood pressure at goal    Self Care Goals & Plans:  Self Care Goal 12/09/2012  Manage my medications take my medicines as prescribed; bring my medications to every visit  Eat healthy foods drink diet soda or water instead of juice or soda; eat foods that are low in salt       Care Management & Community Referrals:  Referral 12/09/2012  Referrals made for care management support none needed  Referrals made to community resources other (see comments)

## 2012-12-09 NOTE — Assessment & Plan Note (Signed)
Assessment: As noted, patient has been out off her calcium and vitamin D supplement, as well as her vitamin D.  Plan: I advised patient to refill her calcium with vitamin D and her vitamin D supplement, and to take them as prescribed.

## 2012-12-09 NOTE — Assessment & Plan Note (Signed)
Lab Results  Component Value Date   GLUCOSE 149* 08/12/2012   GLUCOSE 91 08/05/2012   GLUCOSE 79 04/29/2012   GLUCOSE 86 12/25/2011     Assessment: Patient's glucose was 149 when checked in September.  She has no history of diabetes mellitus.  Plan: Recheck glucose and A1c today.

## 2013-01-05 ENCOUNTER — Encounter: Payer: Self-pay | Admitting: Internal Medicine

## 2013-01-05 ENCOUNTER — Ambulatory Visit (INDEPENDENT_AMBULATORY_CARE_PROVIDER_SITE_OTHER): Payer: Medicare Other | Admitting: Internal Medicine

## 2013-01-05 VITALS — BP 131/87 | HR 78 | Temp 96.6°F | Ht <= 58 in | Wt 113.3 lb

## 2013-01-05 DIAGNOSIS — R5383 Other fatigue: Secondary | ICD-10-CM

## 2013-01-05 DIAGNOSIS — D649 Anemia, unspecified: Secondary | ICD-10-CM

## 2013-01-05 DIAGNOSIS — R5381 Other malaise: Secondary | ICD-10-CM

## 2013-01-05 DIAGNOSIS — E559 Vitamin D deficiency, unspecified: Secondary | ICD-10-CM

## 2013-01-05 MED ORDER — FERROUS GLUCONATE 225 (27 FE) MG PO TABS: 240.0000 mg | ORAL_TABLET | Freq: Two times a day (BID) | ORAL | Status: DC

## 2013-01-05 MED ORDER — CHOLECALCIFEROL 25 MCG (1000 UT) PO CAPS: 1000.0000 [IU] | ORAL_CAPSULE | Freq: Every day | ORAL | Status: DC

## 2013-01-05 NOTE — Progress Notes (Signed)
Subjective:   Patient ID: [name] female   DOB: 1926/04/13 79 y.o.   MRN: [id number]  HPI: 79 year old woman past medical history significant for hypertension, CKD stage III, malignant neoplasm of colon s/p hemicolectomy in 1991 comes to the clinic for evaluation of fatigue off and on for 1 month.  Patient is accompanied by her daughter who is more concerned about well being of her mother but her mother feels fine and apparantly had no complaints at all. Daughter is concerned that her mother is not eating well but the patient states that she is eating all right. Her daughter was concerned that her mother got a little bit short of breath in walking from parking lot to here but patient doesn't seem to be concerned at all. Patient denies any cough, chest pain, leg swelling, fever, chills, congestion, rhinorrhea, abdominal pain, nausea or vomiting.She also denies noticing any blood in stools or alteration in bladder or bowel habits.  Daughter mentions that there is some family stress going on that might be affecting the eating habits of her mother and also her brother is a smoker which really concerned her. Her daughter was living with her mom for about few months with plans to move and  therefore wanted to get her evaluated before she left.  Patient was still not taking her ferrous gluconate, vit-D, calcium and vitamin D.   Past Medical History  Diagnosis Date  . Hypertension   . Renal insufficiency   . Iron deficiency anemia   . Gout   . Osteopenia     DXA bone density scan on 10/16/2009 showed osteopenia with an AP spine T-score of -1.5, left femoral neck T-score of -1.7, and right femoral neck T-score of -2.0.  Her FRAX score gave an estimated ten-year probability of major osteoporotic fracture of 7.3%, and a ten-year probability of hip fracture of 2.1%.  . Vitamin D deficiency     Vitamin D (25-Hydroxy) <10 ng/mL on 12/07/2009.  . Diverticulosis of colon   . Malignant neoplasm of colon  1991    S/P right hemicolectomy in 1991 by Dr. Jamey Ripa, reportedly done for a malignant polyp; colonoscopy by Dr. [name] 02/09/2008 showed  marked left colon diverticulosis with partial narrowing, S/P  remote right hemicolectomy.  . Adenomatous colon polyp   . Lower GI bleed   . Gastric ulcer 1991    H. pylori positive in 1991; EGD 01/31/00 by Dr. Juanda Chance showed mild benign distal esophageal stricture (dilator passed), gastritis with evidence of coffee-ground material, and a 4 cm non-reducible hiatal hernia; EGD  02/09/2008 showed hiatal hernia, and mild benign distal espohageal stricture, was otherwise normal.  . Elevated alkaline phosphatase level     Chronic; GGT was normal (30) on 09/18/2004.  [name] Fatigue   . Constipation   . Tinnitus, bilateral     Audiological evaluation on February 05, 2007 showed a slight to mild loss of hearing in both ears through 4000 Hz, with a moderate to severe precipitous drop in the highest frequencies (6000 to 8000 Hz); her speech recognition abilities were within normal limits and a slightly elevated conversational level.  . Sensorineural hearing loss of both ears     Audiological evaluation on February 05, 2007 showed a slight to mild loss in both ears through 4000 Hz, with a moderate to severe precipitous drop in the highest frequencies (6000 to 8000 Hz).  . Endometrial polyp     S/P operative hysteroscopy with removal of polyps, dilatation  and curettage on 10/20/2000 by Dr. Antionette Char.  [name] Unsteady gait 08/07/2011   Family History  Problem Relation Age of Onset  . Breast cancer Maternal Aunt   . Colon cancer Neg Hx    History   Social History  . Marital Status: Widowed    Spouse Name: N/A    Number of Children: N/A  . Years of Education: N/A   Occupational History  .      retired   Social History Main Topics  . Smoking status: Never Smoker   . Smokeless tobacco: Never Used  . Alcohol Use: No  . Drug Use: Not on file  . Sexually Active: Not on  file   Other Topics Concern  . Not on file   Social History Narrative  . No narrative on file   Review of Systems: General: Denies fever, chills, diaphoresis,   + fatigue, +  decrease appetite. HEENT: Denies photophobia, eye pain, redness, hearing loss, ear pain, congestion, sore throat, rhinorrhea, sneezing, mouth sores, trouble swallowing, neck pain, neck stiffness and tinnitus. Respiratory: Denies SOB, DOE, cough, chest tightness, and wheezing. Cardiovascular: Denies to chest pain, palpitations and leg swelling. Gastrointestinal: Denies nausea, vomiting, abdominal pain, diarrhea, constipation, blood in stool and abdominal distention. Genitourinary: Denies dysuria, urgency, frequency, hematuria, flank pain and difficulty urinating. Musculoskeletal: Denies myalgias, back pain, joint swelling, arthralgias and gait problem.  Skin: Denies pallor, rash and wound. Neurological: Denies dizziness, seizures, syncope, weakness, light-headedness, numbness and headaches. Hematological: Denies adenopathy, easy bruising, personal or family bleeding history. Psychiatric/Behavioral: Denies suicidal ideation, mood changes, confusion, nervousness, sleep disturbance and agitation.    Current Outpatient Medications: Current Outpatient Prescriptions  Medication Sig Dispense Refill  . allopurinol (ZYLOPRIM) 100 MG tablet Take 2 tablets (200 mg total) by mouth daily.  62 tablet  9  . Calcium Carbonate-Vit D-Min 600-400 MG-UNIT TABS Take 1 tablet by mouth 2 (two) times daily.  60 each  11  . colchicine (COLCRYS) 0.6 MG tablet Take 1 tablet (0.6 mg total) by mouth daily.  30 tablet  6  . [employer] VITAMIN D3 1000 UNITS capsule TAKE 1 (1,000 UNITS TOTAL) BY MOUTH DAILY.  30 capsule  3  . diltiazem (TIAZAC) 420 MG 24 hr capsule Take 1 capsule (420 mg total) by mouth daily.  30 capsule  11  . docusate sodium (COLACE) 100 MG capsule Take 1 capsule (100 mg total) by mouth daily as needed.  30 capsule  11  . famotidine  (PEPCID) 20 MG tablet TAKE 1 TABLET (20 MG TOTAL) BY MOUTH DAILY.  31 tablet  6  . ferrous gluconate (FERGON) 225 (27 FE) MG tablet Take 1 tablet (240 mg total) by mouth 2 (two) times daily with a meal.  60 each  3  . losartan (COZAAR) 50 MG tablet TAKE 1 TABLET (50 MG TOTAL) BY MOUTH DAILY.  31 tablet  6    Allergies: No Known Allergies    Objective:   Physical Exam: Filed Vitals:   01/05/13 0837  BP: 131/87  Pulse: 78  Temp: 96.6 F (35.9 C)    General: Vital signs reviewed and noted. Well-developed, well-nourished, in no acute distress; alert, appropriate and cooperative throughout examination. Head: Normocephalic, atraumatic Lungs: Normal respiratory effort. Clear to auscultation BL without crackles or wheezes. Heart: RRR. S1 and S2 normal without gallop, murmur, or rubs. Abdomen:BS normoactive. Soft, Nondistended, non-tender.  No masses or organomegaly. Extremities: No pretibial edema.     Assessment & Plan:

## 2013-01-05 NOTE — Assessment & Plan Note (Signed)
Patient's daughter was concerned about a month of one of on and off fatigue with decrease in appetite. Though patient does not seem to be concerned at all and states that she has been eating normally. Daughter also mentions some family stress that could be contributing to changes in her meal pattern. Her exam was completely benign today. Her workup about 3 weeks ago including CBC, TSH were within normal limits. Her vitamin D levels were low normal . Patient was advised to continue taking ferrous gluconate,  vitamin D that she was not taking yet. With her last visit Dr. Meredith Pel was also concerned about the fluctuation in her weight but with the normal labs and negative mammogram and Pap smear in 2012, there is no indication for any further tests or imaging. -Patient was counseled and educated on eating whatever she likes. Eating small portions more frequently could be helpful. She was advised to bring some changes in her routine daily meals and see if that helps in stimulating her appetite. Example changing to grits or scramble eggs or toast from her daily cornflakes. She is willing to try that.  -She was advised to call the clinic if she developed any fevers, chills, abdominal pain, nausea or vomiting. She verbalizes understanding

## 2013-01-05 NOTE — Patient Instructions (Addendum)
General Instructions: Please schedule a follow up appointment in 1 -2 months with PCP . Please bring your medication bottles with your next appointment. Please take your medicines as prescribed. Please try eating small portions frequently of whatever you like.  Please call us if you start developing fever, chills, SOB, nausea or vomiting etc.      Treatment Goals:  Goals (1 Years of Data) as of 01/05/2013          As of Today 12/09/12 08/05/12 04/29/12 12/25/11     Blood Pressure    . Blood Pressure < 140/90  131/87 122/85 138/88 180/100 132/70      Progress Toward Treatment Goals:  Treatment Goal 01/05/2013  Blood pressure at goal    Self Care Goals & Plans:  Self Care Goal 01/05/2013  Manage my medications take my medicines as prescribed; bring my medications to every visit; refill my medications on time  Eat healthy foods eat baked foods instead of fried foods  Be physically active (No Data)  Other -       Care Management & Community Referrals:  Referral 01/05/2013  Referrals made for care management support none needed  Referrals made to community resources -

## 2013-02-10 ENCOUNTER — Ambulatory Visit: Payer: Medicare Other | Admitting: Internal Medicine

## 2013-02-23 ENCOUNTER — Other Ambulatory Visit: Payer: Self-pay | Admitting: Internal Medicine

## 2013-03-27 ENCOUNTER — Other Ambulatory Visit: Payer: Self-pay | Admitting: Internal Medicine

## 2013-03-27 DIAGNOSIS — I10 Essential (primary) hypertension: Secondary | ICD-10-CM

## 2013-04-21 ENCOUNTER — Ambulatory Visit (INDEPENDENT_AMBULATORY_CARE_PROVIDER_SITE_OTHER): Payer: Medicare Other | Admitting: Internal Medicine

## 2013-04-21 ENCOUNTER — Telehealth: Payer: Self-pay | Admitting: Internal Medicine

## 2013-04-21 ENCOUNTER — Encounter: Payer: Self-pay | Admitting: Internal Medicine

## 2013-04-21 VITALS — BP 121/77 | HR 78 | Temp 97.0°F | Ht <= 58 in | Wt 106.8 lb

## 2013-04-21 DIAGNOSIS — M949 Disorder of cartilage, unspecified: Secondary | ICD-10-CM

## 2013-04-21 DIAGNOSIS — R634 Abnormal weight loss: Secondary | ICD-10-CM

## 2013-04-21 DIAGNOSIS — F0280 Dementia in other diseases classified elsewhere without behavioral disturbance: Secondary | ICD-10-CM | POA: Insufficient documentation

## 2013-04-21 DIAGNOSIS — D509 Iron deficiency anemia, unspecified: Secondary | ICD-10-CM

## 2013-04-21 DIAGNOSIS — N259 Disorder resulting from impaired renal tubular function, unspecified: Secondary | ICD-10-CM

## 2013-04-21 DIAGNOSIS — M899 Disorder of bone, unspecified: Secondary | ICD-10-CM

## 2013-04-21 DIAGNOSIS — I10 Essential (primary) hypertension: Secondary | ICD-10-CM

## 2013-04-21 DIAGNOSIS — R413 Other amnesia: Secondary | ICD-10-CM

## 2013-04-21 DIAGNOSIS — Z1239 Encounter for other screening for malignant neoplasm of breast: Secondary | ICD-10-CM

## 2013-04-21 LAB — COMPLETE METABOLIC PANEL WITH GFR
AST: 32 U/L (ref 0–37)
Albumin: 4.3 g/dL (ref 3.5–5.2)
Alkaline Phosphatase: 119 U/L — ABNORMAL HIGH (ref 39–117)
BUN: 25 mg/dL — ABNORMAL HIGH (ref 6–23)
Creat: 1.85 mg/dL — ABNORMAL HIGH (ref 0.50–1.10)
GFR, Est Non African American: 24 mL/min — ABNORMAL LOW
Glucose, Bld: 74 mg/dL (ref 70–99)
Potassium: 4.1 mEq/L (ref 3.5–5.3)
Total Bilirubin: 0.7 mg/dL (ref 0.3–1.2)

## 2013-04-21 LAB — CBC WITH DIFFERENTIAL/PLATELET
Basophils Absolute: 0 10*3/uL (ref 0.0–0.1)
Eosinophils Relative: 0 % (ref 0–5)
HCT: 32.6 % — ABNORMAL LOW (ref 36.0–46.0)
Hemoglobin: 11 g/dL — ABNORMAL LOW (ref 12.0–15.0)
Lymphocytes Relative: 29 % (ref 12–46)
Lymphs Abs: 1.3 10*3/uL (ref 0.7–4.0)
MCV: 87.6 fL (ref 78.0–100.0)
Monocytes Absolute: 0.4 10*3/uL (ref 0.1–1.0)
Monocytes Relative: 9 % (ref 3–12)
Neutro Abs: 2.9 10*3/uL (ref 1.7–7.7)
RBC: 3.72 MIL/uL — ABNORMAL LOW (ref 3.87–5.11)
RDW: 17.8 % — ABNORMAL HIGH (ref 11.5–15.5)
WBC: 4.6 10*3/uL (ref 4.0–10.5)

## 2013-04-21 NOTE — Assessment & Plan Note (Signed)
Lab Results  Component Value Date   HGB 11.9* 12/09/2012   HGB 10.8* 04/29/2012   HGB 11.8* 12/25/2011     Ferritin  Date Value Range Status  12/09/2012 126  10 - 291 ng/mL Final     Assessment: Patient has no symptoms of anemia; her last hemoglobin was mildly low.  Her last ferritin was normal.  Plan:  Plan is to recheck a CBC.

## 2013-04-21 NOTE — Assessment & Plan Note (Signed)
Assessment: Patient has some degree of memory loss likely representing mild to moderate dementia.  I discussed this with patient and with her son, and provided an informational handout regarding dementia.  I also discussed the option of trying a medication, which they would like to consider.  Plan: Will readdress at next visit after they have had a chance to review the educational material and consider treatment

## 2013-04-21 NOTE — Progress Notes (Signed)
  Subjective:    Patient ID: [name], female    DOB: [birth year], 79 y.o.   MRN: [id number]  HPI Patient returns for follow-up of her hypertension, weight loss, and other chronic medical problems.  She was accompanied to clinic today by her son, who also voices concern about memory problems he has noted.  He reports that patient has difficulty with short-term memory, and he used the example that after they leave the clinic she will have difficulty remembering the details of the visit.  She currently lives with her son; she does some cooking and they have noted no problems leaving pots on the stove.  She is able to recognize family members and has not become disoriented as to place.  Patient's son reports that she eats small meals, and she confirms this; she denies any problem with abdominal pain or early satiety, she simply says that she does not feel like eating more.  Review of Systems  Constitutional: Negative for fever, chills and diaphoresis.  Respiratory: Negative for cough, shortness of breath and wheezing.   Cardiovascular: Negative for chest pain and leg swelling.  Gastrointestinal: Negative for nausea, vomiting, abdominal pain and blood in stool.  Genitourinary: Negative for dysuria and frequency.       Objective:   Physical Exam  Constitutional: No distress.  Cardiovascular: Normal rate, regular rhythm and normal heart sounds.  Exam reveals no gallop and no friction rub.   No murmur heard. Pulmonary/Chest: Effort normal and breath sounds normal. No respiratory distress. She has no wheezes. She has no rales.  Abdominal: Soft. Bowel sounds are normal. She exhibits no distension and no mass. There is no hepatosplenomegaly. There is no tenderness. There is no rebound and no guarding.  Musculoskeletal: She exhibits no edema.  Neurologic: Patient is oriented to person and knows she is in the clinic; she cannot recall the name of the hospital; she knows this is [place] but could not  name the county or state; she cannot state the year, month, day, or day of the week; she could not name the president; she was able to follow a three-step command without difficulty; she was able to verbally state a complete sentence although she did not write it down; she was able to follow a written command to close her eyes.      Assessment & Plan:

## 2013-04-21 NOTE — Assessment & Plan Note (Signed)
Assessment: Patient has not been taking her calcium with vitamin D supplement..  Plan: I again advised patient to refill her calcium with vitamin D and to take it as prescribed.

## 2013-04-21 NOTE — Patient Instructions (Addendum)
General Instructions: Please drink a nutritional supplement (such as Ensure) twice a day. A referral has been made to gastroenterology for evaluation of weight loss. A referral has been made to nutrition for assistance with management of weight loss.    Treatment Goals:  Goals (1 Years of Data) as of 04/21/13         As of Today 01/05/13 12/09/12 08/05/12 04/29/12     Blood Pressure    . Blood Pressure < 140/90  121/77 131/87 122/85 138/88 180/100      Progress Toward Treatment Goals:  Treatment Goal 04/21/2013  Blood pressure at goal    Self Care Goals & Plans:  Self Care Goal 04/21/2013  Manage my medications take my medicines as prescribed; bring my medications to every visit; refill my medications on time  Eat healthy foods -  Be physically active -  Other -       Care Management & Community Referrals:  Referral 04/21/2013  Referrals made for care management support nutritionist  Referrals made to community resources -

## 2013-04-21 NOTE — Assessment & Plan Note (Signed)
Lab Results  Component Value Date   CREATININE 1.55* 12/09/2012   CREATININE 1.96* 08/12/2012   CREATININE 1.98* 08/05/2012   CREATININE 1.21* 04/29/2012   CREATININE 1.57* 12/25/2011   CREATININE 1.34* 08/07/2011   CREATININE 0.91 06/18/2011   CREATININE 1.23* 06/17/2011     Assessment: Patient's renal function is stable.  Plan: We previously requested that nephrology contact patient and reschedule her appointment, and we advised patient and family of the importance of keeping her appointment.  However, it is not clear that she followed up with nephrology.

## 2013-04-21 NOTE — Assessment & Plan Note (Addendum)
Wt Readings from Last 6 Encounters:  04/21/13 106 lb 12.8 oz (48.444 kg)  01/05/13 113 lb 4.8 oz (51.393 kg)  12/09/12 115 lb 4.8 oz (52.3 kg)  08/05/12 124 lb (56.246 kg)  04/29/12 126 lb 14.4 oz (57.561 kg)  12/25/11 119 lb 11.2 oz (54.296 kg)     Assessment: Patient's weight has continued to decline; she has lost another 7 pounds since her last visit, and 20 pounds over the past year.  The cause of her weight loss is not clear.  She is apparently eating very small meals, this is confirmed by her son.  She has a history of a malignant colonic polyp and is status post a right hemicolectomy in 1991; she has had repeated surveillance colonoscopies over the years, last done in March of 2009 by Dr. Juanda Chance who did not plan further surveillance given her age.  She also has a past history of gastric ulcer.   Plan: Will recheck labs including a TSH level.  Will refer to GI for consideration of GI workup given her continued weight loss and past history of colon cancer and gastric ulcer.  I advised patient to drink a nutritional supplement such as Ensure twice a day in addition to trying to be intentional about her meals.  Will also refer to nutritionist and order a mammogram.

## 2013-04-21 NOTE — Assessment & Plan Note (Signed)
BP Readings from Last 3 Encounters:  04/21/13 121/77  01/05/13 131/87  12/09/12 122/85    Lab Results  Component Value Date   NA 140 12/09/2012   K 4.0 12/09/2012   CREATININE 1.55* 12/09/2012    Assessment: Blood pressure control: controlled Progress toward BP goal:  at goal Comments: Blood pressure is well controlled on diltiazem 420 mg daily and losartan 50 mg daily  Plan: Medications:  continue current medications Educational resources provided: brochure Self management tools provided: home blood pressure logbook

## 2013-04-21 NOTE — Telephone Encounter (Signed)
Spoke with [facility] and scheduled patient with Willette Cluster, NP on 04/23/13 at 10:30 AM.

## 2013-04-23 ENCOUNTER — Encounter: Payer: Self-pay | Admitting: Nurse Practitioner

## 2013-04-23 ENCOUNTER — Ambulatory Visit (INDEPENDENT_AMBULATORY_CARE_PROVIDER_SITE_OTHER): Payer: Medicare Other | Admitting: Nurse Practitioner

## 2013-04-23 VITALS — BP 90/60 | HR 80 | Ht <= 58 in | Wt 104.2 lb

## 2013-04-23 DIAGNOSIS — D649 Anemia, unspecified: Secondary | ICD-10-CM

## 2013-04-23 DIAGNOSIS — Z86010 Personal history of colonic polyps: Secondary | ICD-10-CM

## 2013-04-23 DIAGNOSIS — R634 Abnormal weight loss: Secondary | ICD-10-CM

## 2013-04-23 DIAGNOSIS — N259 Disorder resulting from impaired renal tubular function, unspecified: Secondary | ICD-10-CM

## 2013-04-23 NOTE — Patient Instructions (Addendum)
We will call you with the appointment for the CT scan after Gunnar Fusi speaks with the radiologist. Follow up with Dr Juanda Chance on 06/15/13 at 2:15 pm. CC:  Margarito Liner MD

## 2013-04-23 NOTE — Progress Notes (Signed)
HPI :  Patient is an 79 year old female known to Dr. Juanda Chance for history of adenomatous colon polyps. She is status post remote right hemicolectomy for large polyp with high-grade dysplasia. Her last colonoscopy was March 2009 with findings of a normal ileocolonic anastomosis, sigmoid diverticulosis with narrowing . Patient also has a history of an esophageal stricture found on 2009 EGD and dilated with savory dilator is referred here for evaluation of weight loss. According to PCPs note patient has lost 13 pounds since January 2013. She's lost 9 of those pounds since January of this year. Patient is with a family member who helps provide the history. Patient not interested in eating anything other than junk food. She denies abdominal pain or nausea. Bowel movements are okay. Recent comprehensive.metabolic profile with renal function at about her baseline. Hemoglobin 11 with normal MCV. Anemia at baseline as well. TSH normal.   Past Medical History  Diagnosis Date  . Hypertension   . Renal insufficiency   . Iron deficiency anemia   . Gout   . Osteopenia     DXA bone density scan on 10/16/2009 showed osteopenia with an AP spine T-score of -1.5, left femoral neck T-score of -1.7, and right femoral neck T-score of -2.0.  Her FRAX score gave an estimated ten-year probability of major osteoporotic fracture of 7.3%, and a ten-year probability of hip fracture of 2.1%.  . Vitamin D deficiency     Vitamin D (25-Hydroxy) <10 ng/mL on 12/07/2009.  . Diverticulosis of colon   . Malignant neoplasm of colon 1991    S/P right hemicolectomy in 1991 by Dr. Jamey Ripa, reportedly done for a malignant polyp; colonoscopy by Dr. [name] 02/09/2008 showed  marked left colon diverticulosis with partial narrowing, S/P  remote right hemicolectomy.  . Adenomatous colon polyp   . Lower GI bleed   . Gastric ulcer 1991    H. pylori positive in 1991; EGD 01/31/00 by Dr. Juanda Chance showed mild benign distal esophageal stricture  (dilator passed), gastritis with evidence of coffee-ground material, and a 4 cm non-reducible hiatal hernia; EGD  02/09/2008 showed hiatal hernia, and mild benign distal espohageal stricture, was otherwise normal.  . Elevated alkaline phosphatase level     Chronic; GGT was normal (30) on 09/18/2004.  [name] Constipation   . Tinnitus, bilateral     Audiological evaluation on February 05, 2007 showed a slight to mild loss of hearing in both ears through 4000 Hz, with a moderate to severe precipitous drop in the highest frequencies (6000 to 8000 Hz); her speech recognition abilities were within normal limits and a slightly elevated conversational level.  . Sensorineural hearing loss of both ears     Audiological evaluation on February 05, 2007 showed a slight to mild loss in both ears through 4000 Hz, with a moderate to severe precipitous drop in the highest frequencies (6000 to 8000 Hz).  . Endometrial polyp     S/P operative hysteroscopy with removal of polyps, dilatation and curettage on 10/20/2000 by Dr. Antionette Char.  [name] Unsteady gait 08/07/2011  . Hiatal hernia   . Esophageal stricture   . Diverticulosis     Family History  Problem Relation Age of Onset  . Breast cancer Maternal Aunt   . Colon cancer Neg Hx    History  Substance Use Topics  . Smoking status: Never Smoker   . Smokeless tobacco: Never Used  . Alcohol Use: No   Current Outpatient Prescriptions  Medication Sig Dispense Refill  . allopurinol (ZYLOPRIM)  100 MG tablet TAKE 2 TABLETS (200 MG TOTAL) BY MOUTH DAILY.  62 tablet  9  . Calcium Carbonate-Vit D-Min 600-400 MG-UNIT TABS Take 1 tablet by mouth 2 (two) times daily.  60 each  11  . colchicine (COLCRYS) 0.6 MG tablet Take 1 tablet (0.6 mg total) by mouth daily.  30 tablet  6  . diltiazem (TIAZAC) 420 MG 24 hr capsule Take 1 capsule (420 mg total) by mouth daily.  30 capsule  11  . losartan (COZAAR) 50 MG tablet Take 1 tablet (50 mg total) by mouth daily.  90 tablet  3   No  current facility-administered medications for this visit.   No Known Allergies   Review of Systems: All systems reviewed and negative except where noted in HPI.   Physical Exam: BP 90/60  Pulse 80  Ht 4' 8.5" (1.435 m)  Wt 104 lb 4 oz (47.287 kg)  BMI 22.96 kg/m2 Constitutional: [place], black female in no acute distress. HEENT: Normocephalic and atraumatic. Conjunctivae are normal. No scleral icterus. Neck supple.  Cardiovascular: Normal rate, regular rhythm.  Pulmonary/chest: Effort normal and breath sounds normal. No wheezing, rales or rhonchi. Abdominal: Soft, nondistended, nontender. Bowel sounds active throughout. There are no masses palpable. No hepatomegaly. Rectal: heme negative stool Extremities: no edema Lymphadenopathy: No cervical adenopathy noted. Neurological: Alert and oriented. Skin: Skin is warm and dry. No rashes noted. Psychiatric: Normal mood and affect. Behavior is normal.   ASSESSMENT AND PLAN:  1.  Weight loss. Weight down 13 pounds since since January 2013. She's lost 9 of those pounds since January of this year.  Patient denies abdominal pain or nausea. Family tells me patient will not eat meals but eats junk food instead. Cause of weight loss unclear. Not sure endoscopic workup will be helpful. CTscan could help exclude underlying malignancy as source of weight loss but given renal insufficieny IV contrast may be problem. Will talk with radiology as well as patient's primary GI, Dr. Juanda Chance about this and get back to the patient. In meantime I encouraged use of Ensure of similar supplements.  2. History of adenomatous colon polyps. She is status post remote right hemicolectomy for large polyp with high-grade dysplasia. Her last colonoscopy was March 2009 with findings of a normal ileocolonic anastomosis and sigmoid diverticulosis with narrowing .   3. History of an esophageal stricture, s/p savory dilation 2009.  No dysphagia at present.   4. Normocytic  anemia, stable. Suspect renal insufficiency contributing factor. Heme negative.

## 2013-04-25 NOTE — Progress Notes (Signed)
Reviewed and agree, long history of functional dyspeptic symptoms. OK to evaluate with RGD if symptoms persist

## 2013-04-27 ENCOUNTER — Other Ambulatory Visit: Payer: Self-pay | Admitting: Internal Medicine

## 2013-04-30 ENCOUNTER — Telehealth: Payer: Self-pay | Admitting: *Deleted

## 2013-04-30 NOTE — Telephone Encounter (Signed)
Moved OV to 05/21/13 at 2:30 PM. Called and left a message for [name] patient's daughter to call me.

## 2013-04-30 NOTE — Telephone Encounter (Signed)
Message copied by Daphine Deutscher on Fri Apr 30, 2013  2:55 PM ------      Message from: Meredith Pel      Created: Fri Apr 30, 2013 10:20 AM       Rene Kocher, please call daughter and see if patient's weight is stable. If not then please let me know. She will likely need CTscan. Thanks. Also, please see if brodie has appt for her in next 2-3 weeks. Thanks ------

## 2013-05-04 NOTE — Telephone Encounter (Signed)
Left a message for patient to call me. 

## 2013-05-05 NOTE — Telephone Encounter (Signed)
.  Left message for patient or her daughter to call me at cell and home number.

## 2013-05-05 NOTE — Telephone Encounter (Signed)
Spoke with patient's daughter and she states her mother's weight is stable. Gave her the new appointment date.

## 2013-05-21 ENCOUNTER — Ambulatory Visit (INDEPENDENT_AMBULATORY_CARE_PROVIDER_SITE_OTHER): Payer: Medicare Other | Admitting: Internal Medicine

## 2013-05-21 ENCOUNTER — Encounter: Payer: Self-pay | Admitting: Internal Medicine

## 2013-05-21 ENCOUNTER — Other Ambulatory Visit (INDEPENDENT_AMBULATORY_CARE_PROVIDER_SITE_OTHER): Payer: Medicare Other

## 2013-05-21 VITALS — BP 110/60 | HR 60 | Ht <= 58 in | Wt 108.0 lb

## 2013-05-21 DIAGNOSIS — R9389 Abnormal findings on diagnostic imaging of other specified body structures: Secondary | ICD-10-CM

## 2013-05-21 DIAGNOSIS — D509 Iron deficiency anemia, unspecified: Secondary | ICD-10-CM

## 2013-05-21 DIAGNOSIS — R932 Abnormal findings on diagnostic imaging of liver and biliary tract: Secondary | ICD-10-CM

## 2013-05-21 DIAGNOSIS — R634 Abnormal weight loss: Secondary | ICD-10-CM

## 2013-05-21 LAB — AMYLASE: Amylase: 147 U/L — ABNORMAL HIGH (ref 27–131)

## 2013-05-21 LAB — IBC PANEL: Saturation Ratios: 11.9 % — ABNORMAL LOW (ref 20.0–50.0)

## 2013-05-21 LAB — VITAMIN B12: Vitamin B-12: 544 pg/mL (ref 211–911)

## 2013-05-21 NOTE — Patient Instructions (Addendum)
Your physician has requested that you go to the basement for the following lab work before leaving today: Amylase, Lipase and Iron studies.  You have been scheduled for an abdominal ultrasound at [facility] Radiology (1st floor of hospital) on 06/16/13 at 9:30am. Please arrive 15 minutes prior to your appointment for registration. Make certain not to have anything to eat or drink 6 hours prior to your appointment. Should you need to reschedule your appointment, please contact radiology at [phone]. This test typically takes about 30 minutes to perform.  You have been scheduled for an endoscopy with propofol. Please follow written instructions given to you at your visit today. If you use inhalers (even only as needed), please bring them with you on the day of your procedure. Your physician has requested that you go to www.startemmi.com and enter the access code given to you at your visit today. This web site gives a general overview about your procedure. However, you should still follow specific instructions given to you by our office regarding your preparation for the procedure.  Cc: Margarito Liner, MD

## 2013-05-21 NOTE — Progress Notes (Signed)
[name] [birth year] MRN [id number]        History of Present Illness:  This is a 79 year old African American female with history  dysplastic cecal polyp requiring right hemicolectomy in 1991by Dr [name].. She has had numerous colonoscopies, last one in March 2009 showed severe diverticulosis of the left colon. She has been steadily losing weight. She lives with her son who is  retired and who cooks for her and takes a good care of her. She has recently been started on Ensure one can twice a day and has gained 2 pounds from 106 to  currently 108 pounds. She used to weigh 182 pounds in 2002. CT scan of the abdomen in July 2012 for nausea and vomiting showed large hiatal hernia which compressed her stomach and displaced the lower thoracic aorta. Her small bowel was dilated to the ileocecal anastomosis suggesting a questionable obstruction. She had lobulated liver but normal spleen. Main pancreatic duct was prominent. She denies abdominal pain nausea vomiting or rectal bleeding. She has been anemic hemoglobin 11.0. There is a history of H. pylori positive gastric ulcer on upper endoscopy in 1991. She was treated with triple therapy.Repeat upper endoscopy 6 weeks later showed healing of the ulcer. She has 3 cm hiatal hernia and chronic renal insufficiency   Past Medical History  Diagnosis Date  . Hypertension   . Renal insufficiency   . Iron deficiency anemia   . Gout   . Osteopenia     DXA bone density scan on 10/16/2009 showed osteopenia with an AP spine T-score of -1.5, left femoral neck T-score of -1.7, and right femoral neck T-score of -2.0.  Her FRAX score gave an estimated ten-year probability of major osteoporotic fracture of 7.3%, and a ten-year probability of hip fracture of 2.1%.  . Vitamin D deficiency     Vitamin D (25-Hydroxy) <10 ng/mL on 12/07/2009.  . Diverticulosis of colon   . Malignant neoplasm of colon 1991    S/P right hemicolectomy in 1991 by Dr. Jamey Duncan, reportedly  done for a malignant polyp; colonoscopy by Dr. Lina Duncan 02/09/2008 showed  marked left colon diverticulosis with partial narrowing, S/P  remote right hemicolectomy.  . Adenomatous colon polyp   . Lower GI bleed   . Gastric ulcer 1991    H. pylori positive in 1991; EGD 01/31/00 by Dr. Juanda Duncan showed mild benign distal esophageal stricture (dilator passed), gastritis with evidence of coffee-ground material, and a 4 cm non-reducible hiatal hernia; EGD  02/09/2008 showed hiatal hernia, and mild benign distal espohageal stricture, was otherwise normal.  . Elevated alkaline phosphatase level     Chronic; GGT was normal (30) on 09/18/2004.  [name] Constipation   . Tinnitus, bilateral     Audiological evaluation on February 05, 2007 showed a slight to mild loss of hearing in both ears through 4000 Hz, with a moderate to severe precipitous drop in the highest frequencies (6000 to 8000 Hz); her speech recognition abilities were within normal limits and a slightly elevated conversational level.  . Sensorineural hearing loss of both ears     Audiological evaluation on February 05, 2007 showed a slight to mild loss in both ears through 4000 Hz, with a moderate to severe precipitous drop in the highest frequencies (6000 to 8000 Hz).  . Endometrial polyp     S/P operative hysteroscopy with removal of polyps, dilatation and curettage on 10/20/2000 by Dr. Antionette Duncan.  [name] Unsteady gait 08/07/2011  . Hiatal hernia   . Esophageal  stricture   . Diverticulosis    Past Surgical History  Procedure Laterality Date  . Hemicolectomy  1991    Right -  with ileocolic anastamosis by Dr. Jamey Duncan  . Hysteroscopy      S/P operative hysteroscopy with removal of polyps, dilatation and curettage on 10/20/2000 by Dr. Antionette Duncan.    reports that she has never smoked. She has never used smokeless tobacco. She reports that she does not drink alcohol. Her drug history is not on file. family history includes Breast cancer in her  maternal aunt.  There is no history of Colon cancer. No Known Allergies      Review of Systems: Denies dysphagia vomiting but admits to occasional nausea and decreased appetite  The remainder of the 10 point ROS is negative except as outlined in H&P   Physical Exam: General appearance  Well developed, in no distress. Very pleasant. Response to questions Eyes- non icteric. HEENT nontraumatic, normocephalic. Mouth no lesions, tongue papillated, no cheilosis. Neck supple without adenopathy, thyroid not enlarged, no carotid bruits, no JVD. Lungs Clear to auscultation bilaterally. Cor normal S1, normal S2, regular rhythm, no murmur,  quiet precordium. Abdomen: Soft nontender with normoactive bowel sounds. No tenderness or mass. Liver edge at costal margin Rectal: Soft Hemoccult negative stool Extremities 2+  pedal edema. Skin no lesions. Neurological alert and oriented x 3. Psychological normal mood and affect.  Assessment and Plan:  79 year old [place] American female with continued weight loss. She has decreased appetite and chronic renal insufficiency. There's a history of gastric ulcer which was  H. pyloric positive more than 20 years ago. We will proceed with upper endoscopy to assess recurrence of the ulcer and possibility of gastric outlet obstruction. We will also check her H. pylori status and followup on the esophageal stricture and hiatal hernia.which were observed on prior EGD.  History of the dysplastic cecal polyps. Status post remote right hemicolectomy in 1991 last colonoscopy 6 years ago. Not planning recall due to age.  Abnormal CT scan of the abdomen 2 years ago raising question of prominent pancreatic duct,  we will proceed with upper abdominal ultrasound of the pancreas and liver we'll check also her amylase, lipase and iron studies   05/21/2013 [name]

## 2013-05-27 ENCOUNTER — Other Ambulatory Visit: Payer: Self-pay | Admitting: Internal Medicine

## 2013-06-09 ENCOUNTER — Ambulatory Visit (INDEPENDENT_AMBULATORY_CARE_PROVIDER_SITE_OTHER): Payer: Medicare Other | Admitting: Dietician

## 2013-06-09 ENCOUNTER — Encounter: Payer: Self-pay | Admitting: Dietician

## 2013-06-09 VITALS — Wt 108.8 lb

## 2013-06-09 DIAGNOSIS — R634 Abnormal weight loss: Secondary | ICD-10-CM

## 2013-06-09 NOTE — Progress Notes (Signed)
Medical Nutrition Therapy:  Appt start time: 1030 end time:  1100.  Assessment:  Primary concerns today: Weight Loss.   Pt has had 20 pound weight loss over the past year per MD note. Pt currently weigh 108.8 pounds and has a BMI of 23.96, which meets the recommendation for BMI for persons age 77 or older.   Usual eating pattern includes 3 meals and 1 snacks per day. Usual physical activity includes light walking.  Frequent foods include Ensure, ice cream, oatmeal, sandwich, really likes chicken, cornbread & sweets.  Avoided foods include Milk- does not tolerate, GI upset.    24-hr recall: (Up at  AM) B ( 8 AM)- Oatmeal, or 2 packets instant with raisin (water), 11/2c cornflakes with sugar in water, or 1 hard boiled egg plus 1 sausage patty, toast. Drinks 1 Ensure at every breakfast.  Snk ( AM)- None L (1 PM)- Sandwich, ham w/mayo, 2 slices bread, or tuna/egg/chicken salad, apple or peach, 1/2 glass regular soda  Snk ( PM)- None D ( 5:30 PM)-  BBQ chicken or Pork chop, mac & cheese, green beans, butter beans, spinach, sometimes 2 pieces cornbread. Or spaghetti with ground beef, steamed vegetables or salad. 1 Ensure, or 1/2 glass regular soda. Snk ( PM)- ice cream or cake.  Patient drinks water in between meals, about 4 cups/day  Pt should be meeting her nutrition needs with this reported diet.  Progress Towards Goal(s):  Progress. Goal: to maintain or increase weight.   Nutritional Diagnosis:  [place]-3.2 Unintentional weight loss As related to inadequate intake, inadequate meal pattern/snacking.  As evidenced by weight loss of 20 pounds over the past year and patient's family report of intake.   Intervention:   -Nutrition education on opportunities to increase calories and protein in diet. -Add chewable daily multi-vitamin.  -Identified opportunities in diet where vitamin C could be added (specifically orange juice - to corn flakes instead of water, with sandwich at lunch instead of soda)  to increase iron absorption as well as calorie consumption.  Monitoring/Evaluation:  Dietary intake, activity, fluid consumption, and body weight in 4 week(s).

## 2013-06-09 NOTE — Patient Instructions (Addendum)
  Can start her on a chewable multivitamin with iron 1-2 a day.   Use orange juice instead of water on Total cereal ( higher in fiber) or can use orange juice instead of soda ( more nutritious)  Other tiops below- cheese on spaghetti and sandwich. Please make a follow up appointment for 3-4 weeks     High Protein, High Calorie Diet A high protein, high calorie diet increases the amount of protein and calories you eat. You may need more protein and calories in your diet because of illness, surgery, injury, weight loss, or having a poor appetite. Eating high protein and high calorie foods can help you gain weight, heal, and recover after illness.  SERVING SIZES  Measuring foods and serving sizes helps to make sure you are getting the right amount of food. The list below tells how big or small some common serving sizes    Tips for adding calories to your diet:  Make meat and cheese omelets.  Add eggs to salads and baked goods.  Add fish and seafood to salads.  Add meat and poultry to casseroles, salads, and soups.  Use peanut butter as a topping for pretzels, celery, crackers, or add it to baked goods.  Use beans in casseroles, dips, and spreads.  GENERAL GUIDELINES TO INCREASE CALORIES  Replace calorie-free drinks with calorie-containing drinks, such as milk, fruit juices, regular soda, milkshakes, and hot chocolate.  Try to eat 6 small meals instead of 3 large meals each day.  Keep snacks handy, such as nuts, trail mixes, dried fruit, and yogurt.  Choose foods with sauces and gravies.  Add dried fruits, honey, and half-and-half to hot or cold cereal.  Add extra fats when possible, such as butter, sour cream, cream cheese, and salad dressings.  Add cheese to foods often.  Consider adding a clear liquid nutritional supplement to your diet.   HIGH CALORIE FOODS Grain/Starch  Baked goods, such as muffins and quick breads.  Croissants.  Pancakes and  waffles. Vegetable   Sauted vegetables in oil.  Fried vegetables.  Salad greens with regular salad dressing or vinegar and oil. Fruit  Dried fruit.  Canned fruit in syrup.  Fruit juice. Fat  Avocado.  Butter or margarine.  Whipped cream.  Mayonnaise.  Salad dressing.  Peanuts and mixed nuts.  Cream cheese and sour cream. Sweets and Dessert  Cake.  Cookies.  Pie.  Ice cream.  Doughnuts and pastries.  Protein and meal replacement bars.  Jam, preserves, and jelly.  Candy bars.  Chocolate.  Chocolate, caramel, or other flavored syrups.

## 2013-06-15 ENCOUNTER — Ambulatory Visit: Payer: Medicare Other | Admitting: Internal Medicine

## 2013-06-16 ENCOUNTER — Ambulatory Visit (HOSPITAL_COMMUNITY)
Admission: RE | Admit: 2013-06-16 | Discharge: 2013-06-16 | Disposition: A | Discharge status: Home / Self Care | Payer: Medicare Other | Source: Ambulatory Visit | Attending: Internal Medicine | Admitting: Internal Medicine

## 2013-06-16 DIAGNOSIS — K8020 Calculus of gallbladder without cholecystitis without obstruction: Secondary | ICD-10-CM | POA: Insufficient documentation

## 2013-06-16 DIAGNOSIS — R9389 Abnormal findings on diagnostic imaging of other specified body structures: Secondary | ICD-10-CM

## 2013-06-16 DIAGNOSIS — N281 Cyst of kidney, acquired: Secondary | ICD-10-CM | POA: Insufficient documentation

## 2013-06-16 DIAGNOSIS — R634 Abnormal weight loss: Secondary | ICD-10-CM

## 2013-06-30 ENCOUNTER — Encounter: Payer: Self-pay | Admitting: Internal Medicine

## 2013-06-30 ENCOUNTER — Ambulatory Visit (AMBULATORY_SURGERY_CENTER): Payer: Medicare Other | Admitting: Internal Medicine

## 2013-06-30 VITALS — BP 142/83 | HR 64 | Temp 98.1°F | Resp 14 | Ht <= 58 in | Wt 108.0 lb

## 2013-06-30 DIAGNOSIS — K2990 Gastroduodenitis, unspecified, without bleeding: Secondary | ICD-10-CM

## 2013-06-30 DIAGNOSIS — K2970 Gastritis, unspecified, without bleeding: Secondary | ICD-10-CM

## 2013-06-30 DIAGNOSIS — R634 Abnormal weight loss: Secondary | ICD-10-CM

## 2013-06-30 DIAGNOSIS — K222 Esophageal obstruction: Secondary | ICD-10-CM

## 2013-06-30 MED ORDER — SODIUM CHLORIDE 0.9 % IV SOLN: 500.0000 mL | INTRAVENOUS | Status: DC

## 2013-06-30 NOTE — Progress Notes (Signed)
Patient did not experience any of the following events: a burn prior to discharge; a fall within the facility; wrong site/side/patient/procedure/implant event; or a hospital transfer or hospital admission upon discharge from the facility. (G8907) Patient did not have preoperative order for IV antibiotic SSI prophylaxis. (G8918)  

## 2013-06-30 NOTE — Patient Instructions (Addendum)
YOU HAD AN ENDOSCOPIC PROCEDURE TODAY AT THE [facility]: Refer to the procedure report that was given to you for any specific questions about what was found during the examination.  If the procedure report does not answer your questions, please call your gastroenterologist to clarify.  If you requested that your care partner not be given the details of your procedure findings, then the procedure report has been included in a sealed envelope for you to review at your convenience later.  YOU SHOULD EXPECT: Some feelings of bloating in the abdomen. Passage of more gas than usual.  Walking can help get rid of the air that was put into your GI tract during the procedure and reduce the bloating. If you had a lower endoscopy (such as a colonoscopy or flexible sigmoidoscopy) you may notice spotting of blood in your stool or on the toilet paper. If you underwent a bowel prep for your procedure, then you may not have a normal bowel movement for a few days.  DIET: Your first meal following the procedure should be a light meal and then it is ok to progress to your normal diet.  A half-sandwich or bowl of soup is an example of a good first meal.  Heavy or fried foods are harder to digest and may make you feel nauseous or bloated.  Likewise meals heavy in dairy and vegetables can cause extra gas to form and this can also increase the bloating.  Drink plenty of fluids but you should avoid alcoholic beverages for 24 hours.  ACTIVITY: Your care partner should take you home directly after the procedure.  You should plan to take it easy, moving slowly for the rest of the day.  You can resume normal activity the day after the procedure however you should NOT DRIVE or use heavy machinery for 24 hours (because of the sedation medicines used during the test).    SYMPTOMS TO REPORT IMMEDIATELY: A gastroenterologist can be reached at any hour.  During normal business hours, 8:30 AM to 5:00 PM Monday through Friday,  call [phone].  After hours and on weekends, please call the GI answering service at [phone] who will take a message and have the physician on call contact you.    Following upper endoscopy (EGD)  Vomiting of blood or coffee ground material  New chest pain or pain under the shoulder blades  Painful or persistently difficult swallowing  New shortness of breath  Fever of 100F or higher  Black, tarry-looking stools  FOLLOW UP: If any biopsies were taken you will be contacted by phone or by letter within the next 1-3 weeks.  Call your gastroenterologist if you have not heard about the biopsies in 3 weeks.  Our staff will call the home number listed on your records the next business day following your procedure to check on you and address any questions or concerns that you may have at that time regarding the information given to you following your procedure. This is a courtesy call and so if there is no answer at the home number and we have not heard from you through the emergency physician on call, we will assume that you have returned to your regular daily activities without incident.  Hiatal hernia, stricture, and post dilation diet information given.,  Await biopsy results.  Eat small amounts in upright position to reduce chance of aspiration pneumonia.  Continue Ensure.  SIGNATURES/CONFIDENTIALITY: You and/or your care partner have signed paperwork which will be entered  into your electronic medical record.  These signatures attest to the fact that that the information above on your After Visit Summary has been reviewed and is understood.  Full responsibility of the confidentiality of this discharge information lies with you and/or your care-partner.

## 2013-06-30 NOTE — Progress Notes (Signed)
Lidocaine-40mg IV prior to Propofol InductionPropofol given over incremental dosages 

## 2013-06-30 NOTE — Op Note (Signed)
[facility] [address] [place], [address]   ENDOSCOPY PROCEDURE REPORT  PATIENT: [name], [name]  MR#: [id number] BIRTHDATE: 10/13/1926 , 87  yrs. old GENDER: Female ENDOSCOPIST: Hart Carwin, MD REFERRED BY:  Ileana Roup, M.D. PROCEDURE DATE:  06/30/2013 PROCEDURE:  EGD w/ biopsy and Savary dilation of esophagus ASA CLASS:     Class III INDICATIONS:  weight loss, CT scan shows large hiatal hernis impinging on thoracic aorta, hx of H.Pylori positive gastric ulcer 1991, last EGD 2009- 4 cm hiatal hernia. MEDICATIONS: MAC sedation, administered by CRNA and propofol (Diprivan) 100mg  IV TOPICAL ANESTHETIC: none  DESCRIPTION OF PROCEDURE: After the risks benefits and alternatives of the procedure were thoroughly explained, informed consent was obtained.  The LB [id number] endoscope was introduced through the mouth and advanced to the second portion of the duodenum. Without limitations.  The instrument was slowly withdrawn as the mucosa was fully examined.      [Esophagus: Esophageal lumen was rather torturous. It was slightly dilated but there was no retained food. Diaphragmatic hiatus was very angulated and eccentric and there was a mild esophageal stricture which allowed the endoscope to traverse into a large hiatal hernia middle of the stricture was about 12 mm. The stricture appeared benign.  Stomach the stomach was insufflated with air and showed large nonreducible hiatal hernia which extended from 29-34 cm from the incisors. There were no Cameron erosions. We'll before for decreased. The gastric antrum was normal as was pyloric outlet. There was no food retention in the stomach. Biopsies were taken from gastric antrum to rule out recurrent H. pyloric Duodenum: Duodenal bulb and descending duodenum appeared normal The endoscope was then brought back into the stomach it was retroflexed and a large hernia was viewed from retroflex  view. Guidewire was then placed into the gastric antrum endoscope was then retracted and Savary dilators passed over the guidewire with a tapered and first without fluoroscopic guidance using 14, 15, and 16 mm dilators. There was small amount of blood on each dilator. Patient tolerated procedure well         The scope was then withdrawn from the patient and the procedure completed.  COMPLICATIONS: There were no complications. ENDOSCOPIC IMPRESSION  large nonreducible hiatal hernia extending from 29-34 cm from the incisors large part of the stomach appears to be in thoracic cavity Mild distal esophageal stricture dilated with Savary dilators a to 16 mm status post gastric antral biopsies to rule out recurrent H. pylori RECOMMENDATIONS: 1.  Await pathology results 2.  Anti-reflux regimen to be follow 3.  Continue PPI patient needs to eat small amountsat a time and  always  in upright position to prevent possible aspiration, she is at high risk for aspiration pneumonia. She will continue on Ensure supplements  REPEAT EXAM: no recall  eSigned:  Hart Carwin, MD 06/30/2013 1:50 PM   CC:  PATIENT NAME:  [name], [name] MR#: [id number]

## 2013-06-30 NOTE — Progress Notes (Signed)
Called to room to assist during endoscopic procedure.  Patient ID and intended procedure confirmed with present staff. Received instructions for my participation in the procedure from the performing physician.  

## 2013-07-01 ENCOUNTER — Telehealth: Payer: Self-pay | Admitting: *Deleted

## 2013-07-01 NOTE — Telephone Encounter (Signed)
  Follow up Call-  Call back number 06/30/2013  Post procedure Call Back phone  # [phone]     Patient questions:  Do you have a fever, pain , or abdominal swelling? no Pain Score  0 *  Have you tolerated food without any problems? yes  Have you been able to return to your normal activities? yes  Do you have any questions about your discharge instructions: Diet   no Medications  no Follow up visit  no  Do you have questions or concerns about your Care? no  Actions: * If pain score is 4 or above: No action needed, pain <4.

## 2013-07-07 ENCOUNTER — Encounter: Payer: Self-pay | Admitting: Internal Medicine

## 2013-07-19 ENCOUNTER — Encounter: Payer: Self-pay | Admitting: Internal Medicine

## 2013-07-19 ENCOUNTER — Encounter: Payer: Medicare Other | Admitting: Dietician

## 2013-08-11 ENCOUNTER — Ambulatory Visit: Payer: Medicare Other | Admitting: Dietician

## 2013-09-10 ENCOUNTER — Ambulatory Visit: Payer: Medicare Other | Admitting: Dietician

## 2013-09-24 ENCOUNTER — Ambulatory Visit (INDEPENDENT_AMBULATORY_CARE_PROVIDER_SITE_OTHER): Payer: Medicare Other | Admitting: Dietician

## 2013-09-24 VITALS — Wt 103.4 lb

## 2013-09-24 DIAGNOSIS — R634 Abnormal weight loss: Secondary | ICD-10-CM

## 2013-09-24 NOTE — Progress Notes (Signed)
  Medical Nutrition Therapy:  Appt start time: 1035 end time:  1105. Follow-up visit:  MNT2 Patient is being monitored for weight loss. Last visit 06/2013. She is here with her son who is her caretaker. Note recent GI work up with recommendation for 6 small meals a day. No problems swallowing noted today and son reports adherence to sitting up after meals.   Assessment:  Primary concerns today: Weight management.  MEDICATIONS reviewed- son reports they started chewable MVI, but are currently out. Recent physical activity includes very little per son Weight decreased 4.6# from July. No problems noted with chewing/swallowing or with bowels. Appetite good, (patient ate and drank well during visit today.) 24-hr recall: B ( 9 AM)- Ensure, oatmeal with water or cold cereal with orange juice, bacon or sausage ; L (12-1PM)- lasagna,  Spaghetti ( ~ 2 cups) vegetables with meat; Snk (1-4PM)-Ensure ; D (3-4 PM)-whole chicken salad sandwich ; Snk (6-7 PM)- . Fruit ( estimated intake ~ 1400-1600 calories/day)  Nutrition Focused Physical Exam: Subcutaneous Fat:  Orbital Region: normal Upper Arm Region: significant loose skin and fat depletion Thoracic and Lumbar Region: normal Muscle:  Temple Region: normal Clavicle and Acromion Bone Region: diminished Scapular Bone Region: diminished Dorsal Hand: normal Patellar Region: normal Anterior Thigh Region: diminished Posterior Calf Region: diminished Edema: none noted  Overall: patient showing mild to moderate nutational depletion/malnutrition.    Progress Towards Goal(s):  No progress.   Nutritional Diagnosis:  [place]-3.2 Unintentional weight loss As related to decreased intake vs increased energy needs/decreased utilization.  As evidenced by 4 # weight loss in past 12 weeks despite efforts to increase calorie intake.    Intervention:  Nutrition reassessment and education to increase calorie consumption, evaluate for increased needs.  Coordination of care:  Discuss considering an appetite stimulant  Monitoring/Evaluation:  Dietary intake, exercise, and body weight in 1 month for reweigh. Then with physician in January

## 2013-09-24 NOTE — Patient Instructions (Addendum)
Your weight today was 108.4 pounds- it would be best to try to stay this weight or gain.  Your plan to maintain or gain weight:  1- Daily walk after breakfast to try to stimulate her appetite  2- Can try KEFIR milk or lactose free. Milk as her drinks  3- Use milk or liquid coffee creamer in oatmeal instead of water  4- Stir on parmesan (or any other) cheese into soups and mashed potatoes.  5- Keep raisins, graham crackers, Nutella or peanut butter around for snacks  6- Make a dip for fruits or serve fruit with ice cream.  Monitor her weight at home weekly once you get your scale.

## 2013-10-07 ENCOUNTER — Other Ambulatory Visit: Payer: Self-pay

## 2013-12-30 ENCOUNTER — Other Ambulatory Visit: Payer: Self-pay | Admitting: Internal Medicine

## 2013-12-31 NOTE — Telephone Encounter (Signed)
Please schedule an appointment in the outpatient clinic within 2 months

## 2014-02-13 ENCOUNTER — Other Ambulatory Visit: Payer: Self-pay | Admitting: Internal Medicine

## 2014-02-14 NOTE — Telephone Encounter (Signed)
Please schedule a clinic appointment within 3 months.

## 2014-03-09 ENCOUNTER — Other Ambulatory Visit (HOSPITAL_COMMUNITY): Payer: Self-pay | Admitting: *Deleted

## 2014-03-09 DIAGNOSIS — Z1231 Encounter for screening mammogram for malignant neoplasm of breast: Secondary | ICD-10-CM

## 2014-03-23 ENCOUNTER — Ambulatory Visit (HOSPITAL_COMMUNITY)
Admission: RE | Admit: 2014-03-23 | Discharge: 2014-03-23 | Disposition: A | Discharge status: Home / Self Care | Payer: Medicare Other | Source: Ambulatory Visit | Attending: Internal Medicine | Admitting: Internal Medicine

## 2014-03-23 DIAGNOSIS — Z1231 Encounter for screening mammogram for malignant neoplasm of breast: Secondary | ICD-10-CM | POA: Insufficient documentation

## 2014-03-27 ENCOUNTER — Other Ambulatory Visit: Payer: Self-pay | Admitting: Internal Medicine

## 2014-03-28 NOTE — Telephone Encounter (Signed)
Refill approved for 1 month supply.  Please contact patient and schedule a clinic appointment within 1 month.

## 2014-03-29 NOTE — Telephone Encounter (Signed)
Flag sent to front desk pool for appt as suggested per Dr Meredith PelJoines.

## 2014-04-27 ENCOUNTER — Other Ambulatory Visit: Payer: Self-pay | Admitting: Internal Medicine

## 2014-04-27 NOTE — Telephone Encounter (Signed)
Patient's last clinic visit was in May 2014.  Please schedule an appointment within 1 month in [facility].

## 2014-05-31 ENCOUNTER — Other Ambulatory Visit: Payer: Self-pay | Admitting: Internal Medicine

## 2014-05-31 NOTE — Telephone Encounter (Signed)
Message sent to front desk to schedule.

## 2014-05-31 NOTE — Telephone Encounter (Signed)
Patient's last clinic visit was 04/2013; please schedule an appointment within 1 month and document in chart.

## 2014-06-27 ENCOUNTER — Other Ambulatory Visit: Payer: Self-pay | Admitting: Internal Medicine

## 2014-06-27 NOTE — Telephone Encounter (Signed)
Patient was last seen here in May of 2014.  I requested that an appointment be made within one month when I refilled medication on 03/27/14, 04/27/14, and 05/31/14.  There is no documentation in chart of patient having been contacted.  Please call patient and see if a follow up appointment can be made.

## 2014-06-29 NOTE — Telephone Encounter (Signed)
I talked with [name]'s son and explained that pt needed to be seen to continue getting refills on meds.   He understands and we set up an appointment for August 13th  at 1030 with Dr Beckie Saltsivet.   Please refill meds for one month.

## 2014-07-03 ENCOUNTER — Other Ambulatory Visit: Payer: Self-pay | Admitting: Internal Medicine

## 2014-07-04 NOTE — Telephone Encounter (Signed)
Please find out when this was last filled, and ask patient if she is taking it now.

## 2014-07-11 ENCOUNTER — Other Ambulatory Visit: Payer: Self-pay | Admitting: Internal Medicine

## 2014-07-11 ENCOUNTER — Other Ambulatory Visit: Payer: Self-pay | Admitting: *Deleted

## 2014-07-11 MED ORDER — ALLOPURINOL 100 MG PO TABS: 200.0000 mg | ORAL_TABLET | Freq: Every day | ORAL | Status: DC

## 2014-07-12 NOTE — Telephone Encounter (Signed)
Talked with pharmacy - Rx is ready for pt.

## 2014-07-12 NOTE — Telephone Encounter (Signed)
I refilled this medication yesterday.  Please confirm that pharmacy received refill.

## 2014-07-13 NOTE — Telephone Encounter (Signed)
i spoke w/ the pharm, pt last got colchicine 06/06/2014 #31 written by dr Meredith Peljoines, pharmacist states she gets these on a pretty regular basis, she would like a refill per the pharmacist

## 2014-07-14 ENCOUNTER — Encounter: Payer: Self-pay | Admitting: Internal Medicine

## 2014-07-14 ENCOUNTER — Ambulatory Visit (INDEPENDENT_AMBULATORY_CARE_PROVIDER_SITE_OTHER): Payer: Medicare Other | Admitting: Internal Medicine

## 2014-07-14 VITALS — BP 141/80 | HR 86 | Temp 97.1°F | Ht <= 58 in | Wt 104.3 lb

## 2014-07-14 DIAGNOSIS — M25519 Pain in unspecified shoulder: Secondary | ICD-10-CM

## 2014-07-14 DIAGNOSIS — R413 Other amnesia: Secondary | ICD-10-CM

## 2014-07-14 DIAGNOSIS — N289 Disorder of kidney and ureter, unspecified: Secondary | ICD-10-CM

## 2014-07-14 DIAGNOSIS — R634 Abnormal weight loss: Secondary | ICD-10-CM

## 2014-07-14 DIAGNOSIS — M25512 Pain in left shoulder: Secondary | ICD-10-CM | POA: Insufficient documentation

## 2014-07-14 DIAGNOSIS — D509 Iron deficiency anemia, unspecified: Secondary | ICD-10-CM

## 2014-07-14 DIAGNOSIS — N259 Disorder resulting from impaired renal tubular function, unspecified: Secondary | ICD-10-CM

## 2014-07-14 DIAGNOSIS — I10 Essential (primary) hypertension: Secondary | ICD-10-CM

## 2014-07-14 MED ORDER — ACETAMINOPHEN 325 MG PO TABS: 325.0000 mg | ORAL_TABLET | Freq: Four times a day (QID) | ORAL | Status: DC | PRN

## 2014-07-14 MED ORDER — LOSARTAN POTASSIUM 50 MG PO TABS
25.0000 mg | ORAL_TABLET | Freq: Every day | ORAL | Status: DC
Start: 2014-07-14 — End: 2014-11-01

## 2014-07-14 MED ORDER — MEMANTINE HCL 10 MG PO TABS: 10.0000 mg | ORAL_TABLET | Freq: Every day | ORAL | Status: DC

## 2014-07-14 NOTE — Assessment & Plan Note (Signed)
Pt scored 10 on MMSE. According to son her memory continued to worsen over last year. -Start Namenda 10 mg daily

## 2014-07-14 NOTE — Assessment & Plan Note (Signed)
BP 141/80 today. Pt has not been taking Losartan 50 mg daily as prescribed.  - Restart Losartan at 25 mg daily, can titrate up as needed

## 2014-07-14 NOTE — Assessment & Plan Note (Signed)
Weight stable at 103 lbs since last year's weight of 104 lbs. Son states her appetite is good and she eats 3 meals a day. GI work up negative.  - Continue to monitor

## 2014-07-14 NOTE — Patient Instructions (Signed)
It was a pleasure taking care of you today, [name].  Here is a summary of what we discussed:  1. High Blood Pressure: - Start Losartan 25 mg daily - Continue Diltiazem 420 mg daily  2. Left Shoulder Pain - Start Tylenol 325 mg every 8 hours as needed - Try to change position in bed  3. Memory - Start Namenda 10 mg daily  General Instructions:   Thank you for bringing your medicines today. This helps us keep you safe from mistakes.   Progress Toward Treatment Goals:  Treatment Goal 04/21/2013  Blood pressure at goal    Self Care Goals & Plans:  Self Care Goal 07/14/2014  Manage my medications take my medicines as prescribed; bring my medications to every visit; refill my medications on time  Eat healthy foods eat foods that are low in salt; eat more vegetables; eat baked foods instead of fried foods  Be physically active find an activity I enjoy  Other -    No flowsheet data found.   Care Management & Community Referrals:  Referral 04/21/2013  Referrals made for care management support nutritionist  Referrals made to community resources -

## 2014-07-14 NOTE — Progress Notes (Signed)
Subjective:    Patient ID: Meghan CuretMary M Duncan, female    DOB: 01/13/1926, 80 y.o.   MRN: [id number]  HPI Ms. Meghan BullocksJarrell is a 80yo woman w/ PMHx of HTN, hx of colon cancer s/p right hemicolectomy in 1991, iron deficiency anemia, renal insufficiency, and ~20 lb weight loss since 2013 who presents today with her son (caregiver) for the following:  *Pt is not a very good historian and most of information was obtained through her son, whom she lives with.  1. HTN: Pt takes Diltiazem 420 mg daily at home. Pt was prescribed Losartan 50 mg daily, but according to son she has not taken this medication for several months. BP today is 141/80.  2. Left Shoulder Pain: Son states pt has been complaining of left shoulder pain for past 5-6 months. He believes the pain is likely 2/2 to the way she sleeps on her left arm. He states he has been shoulder exercises with her daily that seems to alleviate the pain. She has not been taking any pain medications to alleviate the pain. Pt states she has difficulty raising her left arm up and that her arm gets "stuck."  3. Memory problems: Son reports patient's memory problems started about 1 year ago and continue to worsen. He reports she forgets "things she knows," such as names, dates, or places. She has not been in a situation where she forgets who she is or where she is at.   4. Unintentional Weight Loss: Pt's weight was 126 in May 2013 and lowest at 103 at last appointment on 09/24/13. Today her weight is 103 lbs. Pt was referred to GI and they performed an upper endoscopy on 06/30/13 that showed a large, nonreducible hiatal hernia, mild distal esophageal stricture that was dilated, and biopsies of the antrum of the stomach were taken that were negative for H.pylori. TSH was normal (1.637) on 04/21/13. According to son, patient's weight has been stable over last year. She eats 3 meals a day with her largest meal at 5:30PM. Her son denies any fever, chills, night sweats, changes in  appetite, weakness, melena, hematochezia, diarrhea.  5. Iron Deficiency Anemia: Pt's last Hbg was 11.0 on 04/21/13. She has a hx of iron deficiency anemia. She is currently not on iron. Pt and her son deny CP, palpitations, SOB, weakness, melena, hematochezia, hematuria, hemoptysis.   6. Renal Insufficiency: Last Cr 1.85, GFR 28 on 04/21/13. Pt has never been to a nephrologist according to son. She denies dysuria, difficulty urinating, hematuria.    Review of Systems General: Denies changes in weight HEENT: Denies headaches, ear pain, changes in vision, rhinorrhea, sore throat CV: Denies orthopnea Pulm: Denies cough, wheezing GI: Denies abdominal pain, nausea, vomiting, constipation GU: Denies frequency Msk: Denies muscle cramps, joint pains Neuro: Denies numbness, tingling Skin: Denies rashes, bruising    Objective:   Physical Exam General: elderly woman in NAD, sitting up in chair, does not contribute much to conversation, pleasant HEENT: [place]/AT, EOMI, PERRL, sclera anicteric, pharynx non-erythematous, mucus membranes moist Neck: supple, no JVD, no lymphadenopathy CV: RRR, normal S1/S2, no m/g/r Pulm: CTA bilaterally, breaths non-labored, no wheezing Abd: BS+, soft, non-distended, non-tender Ext: warm, 1+ pitting edema in lower extremities. Pt has difficulty raising left arm above 90 degrees. No tenderness to palpation of left shoulder or axilla.  Neuro: alert and oriented to person only, CNs II-XII intact, strength 5/5 in upper and lower extremities bilaterally, MMSE score 10       Assessment & Plan:

## 2014-07-14 NOTE — Assessment & Plan Note (Signed)
Last Hbg 11.0 on 04/21/13. Pt not currently taking iron. Pt denies weakness, CP, palpitations, SOB, melena, hematochezia, hemoptysis. - Recheck CBC today

## 2014-07-14 NOTE — Assessment & Plan Note (Signed)
Pt has left shoulder pain that started 5-6 months ago. Son believes it is due to the way she is sleeping. Likely musculoskeletal. - Tylenol 325 mg Q8H as needed - Home shoulder exercises

## 2014-07-14 NOTE — Progress Notes (Signed)
Patient ID: [name], female   DOB: 02/08/1926, 80 y.o.   MRN: [id number]  I saw and evaluated the patient.  I personally confirmed the key portions of the history and exam documented by Dr. Beckie Saltsivet and I reviewed pertinent patient test results.  The assessment, diagnosis, and plan were formulated together and I agree with the documentation in the resident's note.

## 2014-07-14 NOTE — Assessment & Plan Note (Signed)
Last Cr 1.85, GFR 28 on 04/21/13. Pt has no difficulties urinating.  - Recheck CMP today

## 2014-07-15 LAB — CBC
HCT: 34.8 % — ABNORMAL LOW (ref 36.0–46.0)
Hemoglobin: 11.8 g/dL — ABNORMAL LOW (ref 12.0–15.0)
MCH: 29.9 pg (ref 26.0–34.0)
MCHC: 33.9 g/dL (ref 30.0–36.0)
MCV: 88.3 fL (ref 78.0–100.0)
PLATELETS: 199 10*3/uL (ref 150–400)
RBC: 3.94 MIL/uL (ref 3.87–5.11)
RDW: 17.3 % — ABNORMAL HIGH (ref 11.5–15.5)
WBC: 4.6 10*3/uL (ref 4.0–10.5)

## 2014-07-15 LAB — COMPLETE METABOLIC PANEL WITH GFR
ALBUMIN: 4.1 g/dL (ref 3.5–5.2)
ALK PHOS: 122 U/L — AB (ref 39–117)
ALT: 19 U/L (ref 0–35)
AST: 30 U/L (ref 0–37)
BUN: 26 mg/dL — ABNORMAL HIGH (ref 6–23)
CO2: 28 meq/L (ref 19–32)
Calcium: 9 mg/dL (ref 8.4–10.5)
Chloride: 100 mEq/L (ref 96–112)
Creat: 1.37 mg/dL — ABNORMAL HIGH (ref 0.50–1.10)
GFR, EST NON AFRICAN AMERICAN: 35 mL/min — AB
GFR, Est African American: 40 mL/min — ABNORMAL LOW
Glucose, Bld: 136 mg/dL — ABNORMAL HIGH (ref 70–99)
POTASSIUM: 3.1 meq/L — AB (ref 3.5–5.3)
SODIUM: 138 meq/L (ref 135–145)
TOTAL PROTEIN: 6.3 g/dL (ref 6.0–8.3)
Total Bilirubin: 0.5 mg/dL (ref 0.2–1.2)

## 2014-08-02 ENCOUNTER — Other Ambulatory Visit: Payer: Self-pay | Admitting: Internal Medicine

## 2014-08-02 MED ORDER — DILTIAZEM HCL ER BEADS 420 MG PO CP24
420.0000 mg | ORAL_CAPSULE | Freq: Every day | ORAL | Status: DC
Start: 2014-08-02 — End: 2014-11-01

## 2014-08-16 ENCOUNTER — Other Ambulatory Visit: Payer: Self-pay | Admitting: Internal Medicine

## 2014-08-16 MED ORDER — ALLOPURINOL 100 MG PO TABS: 200.0000 mg | ORAL_TABLET | Freq: Every day | ORAL | Status: DC

## 2014-08-16 MED ORDER — COLCHICINE 0.6 MG PO TABS: 0.6000 mg | ORAL_TABLET | Freq: Every day | ORAL | Status: DC

## 2014-08-16 NOTE — Telephone Encounter (Signed)
Please schedule a followup appointment in my clinic when available.

## 2014-09-02 ENCOUNTER — Encounter: Payer: Self-pay | Admitting: *Deleted

## 2014-09-09 ENCOUNTER — Other Ambulatory Visit: Payer: Self-pay | Admitting: Internal Medicine

## 2014-10-03 ENCOUNTER — Encounter: Payer: Self-pay | Admitting: Internal Medicine

## 2014-11-01 ENCOUNTER — Other Ambulatory Visit: Payer: Self-pay | Admitting: Internal Medicine

## 2014-11-01 MED ORDER — DILTIAZEM HCL ER BEADS 420 MG PO CP24: 420.0000 mg | ORAL_CAPSULE | Freq: Every day | ORAL | Status: DC

## 2014-11-01 MED ORDER — LOSARTAN POTASSIUM 50 MG PO TABS: 25.0000 mg | ORAL_TABLET | Freq: Every day | ORAL | Status: DC

## 2014-11-01 MED ORDER — ALLOPURINOL 100 MG PO TABS: 200.0000 mg | ORAL_TABLET | Freq: Every day | ORAL | Status: DC

## 2014-11-01 MED ORDER — COLCHICINE 0.6 MG PO TABS: 0.6000 mg | ORAL_TABLET | Freq: Every day | ORAL | Status: DC

## 2014-11-13 ENCOUNTER — Other Ambulatory Visit: Payer: Self-pay | Admitting: Internal Medicine

## 2014-11-14 NOTE — Telephone Encounter (Signed)
I refilled allopurinol on 11/01/2014 for a three-month supply with 1 additional refill; please call the pharmacy and find out why they did not receive this refill.

## 2014-11-20 ENCOUNTER — Other Ambulatory Visit: Payer: Self-pay | Admitting: Internal Medicine

## 2014-11-21 NOTE — Telephone Encounter (Signed)
I refilled a 1 month supply of colchicine.  Please remind patient that she needs to keep her appointment with me on January 6.

## 2014-12-07 ENCOUNTER — Ambulatory Visit: Payer: Medicare Other | Admitting: Internal Medicine

## 2015-01-01 ENCOUNTER — Other Ambulatory Visit: Payer: Self-pay | Admitting: Internal Medicine

## 2015-01-02 ENCOUNTER — Other Ambulatory Visit: Payer: Self-pay | Admitting: *Deleted

## 2015-01-02 MED ORDER — COLCHICINE 0.6 MG PO TABS
0.6000 mg | ORAL_TABLET | Freq: Every day | ORAL | Status: DC
Start: 2015-01-02 — End: 2015-02-03

## 2015-01-10 ENCOUNTER — Telehealth: Payer: Self-pay | Admitting: Internal Medicine

## 2015-01-10 NOTE — Telephone Encounter (Signed)
Call to patient to confirm appointment for 2/10 at 9:15 lmtcb °

## 2015-01-11 ENCOUNTER — Ambulatory Visit (INDEPENDENT_AMBULATORY_CARE_PROVIDER_SITE_OTHER): Payer: Medicare Other | Admitting: Internal Medicine

## 2015-01-11 ENCOUNTER — Encounter: Payer: Self-pay | Admitting: Licensed Clinical Social Worker

## 2015-01-11 VITALS — BP 133/69 | HR 79 | Temp 97.8°F | Wt 107.6 lb

## 2015-01-11 DIAGNOSIS — M109 Gout, unspecified: Secondary | ICD-10-CM

## 2015-01-11 DIAGNOSIS — I10 Essential (primary) hypertension: Secondary | ICD-10-CM

## 2015-01-11 DIAGNOSIS — R634 Abnormal weight loss: Secondary | ICD-10-CM

## 2015-01-11 DIAGNOSIS — Z0000 Encounter for general adult medical examination without abnormal findings: Secondary | ICD-10-CM

## 2015-01-11 DIAGNOSIS — D509 Iron deficiency anemia, unspecified: Secondary | ICD-10-CM

## 2015-01-11 DIAGNOSIS — R413 Other amnesia: Secondary | ICD-10-CM

## 2015-01-11 DIAGNOSIS — N189 Chronic kidney disease, unspecified: Secondary | ICD-10-CM

## 2015-01-11 DIAGNOSIS — R748 Abnormal levels of other serum enzymes: Secondary | ICD-10-CM

## 2015-01-11 DIAGNOSIS — R739 Hyperglycemia, unspecified: Secondary | ICD-10-CM

## 2015-01-11 DIAGNOSIS — M8580 Other specified disorders of bone density and structure, unspecified site: Secondary | ICD-10-CM

## 2015-01-11 DIAGNOSIS — Z23 Encounter for immunization: Secondary | ICD-10-CM

## 2015-01-11 LAB — COMPLETE METABOLIC PANEL WITH GFR
ALT: 18 U/L (ref 0–35)
AST: 29 U/L (ref 0–37)
Albumin: 4.1 g/dL (ref 3.5–5.2)
Alkaline Phosphatase: 117 U/L (ref 39–117)
BUN: 36 mg/dL — AB (ref 6–23)
CALCIUM: 9.4 mg/dL (ref 8.4–10.5)
CO2: 26 mEq/L (ref 19–32)
CREATININE: 1.43 mg/dL — AB (ref 0.50–1.10)
Chloride: 104 mEq/L (ref 96–112)
GFR, EST NON AFRICAN AMERICAN: 33 mL/min — AB
GFR, Est African American: 38 mL/min — ABNORMAL LOW
Glucose, Bld: 80 mg/dL (ref 70–99)
Potassium: 4.3 mEq/L (ref 3.5–5.3)
SODIUM: 143 meq/L (ref 135–145)
Total Bilirubin: 0.4 mg/dL (ref 0.2–1.2)
Total Protein: 6.6 g/dL (ref 6.0–8.3)

## 2015-01-11 LAB — CBC WITH DIFFERENTIAL/PLATELET
Basophils Absolute: 0 10*3/uL (ref 0.0–0.1)
Basophils Relative: 0 % (ref 0–1)
EOS PCT: 0 % (ref 0–5)
Eosinophils Absolute: 0 10*3/uL (ref 0.0–0.7)
HCT: 36.4 % (ref 36.0–46.0)
Hemoglobin: 12 g/dL (ref 12.0–15.0)
Lymphocytes Relative: 24 % (ref 12–46)
Lymphs Abs: 1.2 10*3/uL (ref 0.7–4.0)
MCH: 30.3 pg (ref 26.0–34.0)
MCHC: 33 g/dL (ref 30.0–36.0)
MCV: 91.9 fL (ref 78.0–100.0)
MONO ABS: 0.6 10*3/uL (ref 0.1–1.0)
Monocytes Relative: 13 % — ABNORMAL HIGH (ref 3–12)
Neutro Abs: 3.1 10*3/uL (ref 1.7–7.7)
Neutrophils Relative %: 63 % (ref 43–77)
Platelets: 197 10*3/uL (ref 150–400)
RBC: 3.96 MIL/uL (ref 3.87–5.11)
RDW: 15.8 % — AB (ref 11.5–15.5)
WBC: 4.9 10*3/uL (ref 4.0–10.5)

## 2015-01-11 MED ORDER — DONEPEZIL HCL 5 MG PO TABS: 5.0000 mg | ORAL_TABLET | Freq: Every day | ORAL | Status: DC

## 2015-01-11 NOTE — Assessment & Plan Note (Addendum)
Assessment: A DXA bone density scan on 10/16/2009 showed osteopenia with an AP spine T-score of -1.5, left femoral neck T-score of -1.7, and right femoral neck T-score of -2.0.  Her FRAX score gave an estimated ten-year probability of major osteoporotic fracture of 7.3%, and a ten-year probability of hip fracture of 2.1%.  Patient has not been taking calcium with vitamin D.  Plan: I discussed with patient and son the importance of taking the calcium with vitamin D.

## 2015-01-11 NOTE — Assessment & Plan Note (Signed)
Lab Results  Component Value Date   ALKPHOS 122* 07/14/2014   ALKPHOS 119* 04/21/2013   ALKPHOS 119* 12/09/2012   ALKPHOS 108 08/05/2012   ALKPHOS 95 04/29/2012   ALKPHOS 131* 12/25/2011     Assessment: Patient has a chronic stable mild elevation of alkaline phosphatase.  Plan: Repeat metabolic panel today.

## 2015-01-11 NOTE — Assessment & Plan Note (Signed)
BP Readings from Last 3 Encounters:  01/11/15 133/69  07/14/14 141/80  06/30/13 142/83    Lab Results  Component Value Date   NA 138 07/14/2014   K 3.1* 07/14/2014   CREATININE 1.37* 07/14/2014    Assessment: Blood pressure control: controlled Progress toward BP goal:  at goal Comments: Blood pressure is well controlled on diltiazem 420 mg daily and losartan 25 mg daily  Plan: Medications:  continue current medications

## 2015-01-11 NOTE — Patient Instructions (Signed)
Start donepezil (Aricept) 5 mg 1 tablet daily.

## 2015-01-11 NOTE — Progress Notes (Signed)
[name] presents for her scheduled [facility] appointment.  Pt's son, [name] requesting to speak with CSW for placement.  CSW met with [name] discussed levels of care staring with PCS, PACE and facility options.  Son states he has been the primary caregiver for [name] for 13 years.  Son states it is 24 hours a day and has thought about options.  CSW inquired as to if pt is agreeable to placement.  Son states because of memory deficits pt is unable of formulating a decision.  [name] states there is a family member that is currently placed at [facility] and would like CSW to send information to [facility] for admission.  Son is anticipating admission within the next 2 months.  CSW provided [name] with listing of [facility] in [facility].  CSW will send Adult Care FL-2 to [facility] once it is signed by PCP.

## 2015-01-11 NOTE — Assessment & Plan Note (Signed)
Lab Results  Component Value Date   CREATININE 1.37* 07/14/2014   CREATININE 1.85* 04/21/2013   CREATININE 1.55* 12/09/2012   CREATININE 1.96* 08/12/2012   CREATININE 1.98* 08/05/2012   CREATININE 1.21* 04/29/2012   CREATININE 1.57* 12/25/2011   CREATININE 1.34* 08/07/2011     Assessment: Patient has stable chronic kidney disease.  Plan: Check a comprehensive metabolic panel today..[name]

## 2015-01-11 NOTE — Assessment & Plan Note (Addendum)
Assessment: Patient has no symptoms of anemia.  Plan:  Plan is check CBC with differential and ferritin level.

## 2015-01-11 NOTE — Assessment & Plan Note (Addendum)
Assessment: Patient has significant memory loss and a Mini-Mental State Examination score consistent with moderate dementia.  Her son does not see noticeable improvement on the memantine 10 mg daily.  I discussed with them the option of adding donepezil (Aricept) to her regimen, and they are agreeable to doing that.  Plan: Check TSH, vitamin B12, and RBC folate.  Start donepezil (Aricept) 5 mg daily; continue memantine (Namenda) 10 mg daily.  Will complete F L2 for adult care home as requested.

## 2015-01-11 NOTE — Progress Notes (Signed)
   Subjective:    Patient ID: [name], female    DOB: 09/13/1926, 81 y.o.   MRN: [id number]  HPI Patient returns for management of her memory loss,  hypertension, anemia, chronic kidney disease, and other chronic medical problems.  She was accompanied to clinic today by her son, who is her caregiver and with whom she lives.  Patient has no acute complaints today; she seems disoriented regarding the situation.  Her son reports that she has ongoing problems with significant memory loss.  He provides 24-hour supervision; he also reports that she needs assistance with bathing and dressing.  He does all meal preparation.  According to him, she recognizes family and has not had problems with wandering.  He has not seen significant improvement in her memory since she was started on memantine at the time of her last visit in August 2015.  He would like to seek placement for her in an adult care home.    Review of Systems  Constitutional: Negative for fever, chills and diaphoresis.  Respiratory: Negative for cough and shortness of breath.   Cardiovascular: Negative for chest pain and leg swelling.  Gastrointestinal: Negative for nausea, vomiting, abdominal pain and blood in stool.  Genitourinary: Negative for dysuria.  Musculoskeletal: Negative for arthralgias.       Objective:   Physical Exam  Constitutional: No distress.  Cardiovascular: Normal rate, regular rhythm and normal heart sounds.  Exam reveals no gallop and no friction rub.   No murmur heard. No pitting lower extremity edema  Pulmonary/Chest: Effort normal and breath sounds normal. No respiratory distress. She has no wheezes. She has no rales.  Abdominal: Soft. Bowel sounds are normal. She exhibits no distension. There is no tenderness. There is no rebound and no guarding.  Neurological:  Patient achieved a total score of 12 on Mini-Mental State Examination          Assessment & Plan:

## 2015-01-12 LAB — FOLATE RBC
FOLATE, RBC: 1125 ng/mL (ref >498)
Folate, Hemolysate: 394.7 ng/mL
HEMATOCRIT: 35.1 % (ref 34.0–46.6)

## 2015-01-12 LAB — FERRITIN: Ferritin: 52 ng/mL (ref 10–291)

## 2015-01-12 LAB — TSH: TSH: 1.214 u[IU]/mL (ref 0.350–4.500)

## 2015-01-12 LAB — VITAMIN B12: Vitamin B-12: 490 pg/mL (ref 211–911)

## 2015-01-13 ENCOUNTER — Encounter: Payer: Self-pay | Admitting: Internal Medicine

## 2015-01-13 DIAGNOSIS — K8020 Calculus of gallbladder without cholecystitis without obstruction: Secondary | ICD-10-CM

## 2015-01-13 DIAGNOSIS — Z0000 Encounter for general adult medical examination without abnormal findings: Secondary | ICD-10-CM | POA: Insufficient documentation

## 2015-01-13 HISTORY — DX: Calculus of gallbladder without cholecystitis without obstruction: K80.20

## 2015-01-13 LAB — HEMOGLOBIN A1C
HEMOGLOBIN A1C: 6 % — AB (ref 4.8–5.6)
MEAN PLASMA GLUCOSE: 126 mg/dL

## 2015-01-13 NOTE — Assessment & Plan Note (Signed)
Wt Readings from Last 8 Encounters:  01/11/15 107 lb 9.6 oz (48.807 kg)  07/14/14 104 lb 4.8 oz (47.31 kg)  09/24/13 103 lb 6.4 oz (46.902 kg)  06/30/13 108 lb (48.988 kg)  06/09/13 108 lb 12.8 oz (49.351 kg)  05/21/13 108 lb (48.988 kg)  04/23/13 104 lb 4 oz (47.287 kg)  04/21/13 106 lb 12.8 oz (48.444 kg)     Assessment: Patient's son reports that her appetite has improved, and her weight is stable over the past year.  I referred patient in 2014 to Dr. Juanda ChanceBrodie, who performed an EGD on 06/30/2013 which showed a large nonreducible hiatal hernia and a mild distal esophageal stricture which she dilated; antral biopsy showed mild chronic inactive gastritis;  no intestinal metaplasia, dysplasia, or malignancy; and was negative for H. Pylori.    Plan: Patient is getting good assistance and support from her son, and her weight is stable.  Plan is continue to periodically monitor weight.

## 2015-01-13 NOTE — Assessment & Plan Note (Signed)
Assessment: Patient is doing well on allopurinol 200 mg daily and colchicine 0.6 mg daily.  She has had no recent gout symptoms or flares.  Plan: Continue current medications.

## 2015-01-13 NOTE — Assessment & Plan Note (Signed)
Influenza vaccine was given; Prevnar vaccine was postponed until next visit to avoid coadministration with influenza vaccine.  Patient's son would like to consider Zostavax and declined prescription today.

## 2015-01-13 NOTE — Assessment & Plan Note (Signed)
Assessment: Patient has had occasional elevated glucose measurements in the past.  Plan: Check glucose and A1c.

## 2015-01-17 ENCOUNTER — Telehealth: Payer: Self-pay | Admitting: Licensed Clinical Social Worker

## 2015-01-17 NOTE — Telephone Encounter (Signed)
CSW received completed and signed Adult Care Home FL-2 for [name].  CSW placed call to son, [name], to confirm pt/family still wanted [facility].  Son in agreement.  CSW faxed chart note and FL[facility]2 to [facility].

## 2015-01-23 ENCOUNTER — Ambulatory Visit (INDEPENDENT_AMBULATORY_CARE_PROVIDER_SITE_OTHER): Payer: Medicare Other | Admitting: *Deleted

## 2015-01-23 ENCOUNTER — Telehealth: Payer: Self-pay | Admitting: *Deleted

## 2015-01-23 DIAGNOSIS — Z0000 Encounter for general adult medical examination without abnormal findings: Secondary | ICD-10-CM

## 2015-01-23 DIAGNOSIS — K921 Melena: Secondary | ICD-10-CM

## 2015-01-23 NOTE — Telephone Encounter (Signed)
I returned call and talked with patient's son.  He reports seeing a small amount of red blood per rectum on Monday one week ago after he started giving her Aricept on Sunday.  He stopped the Aricept on Monday, and he has seen no more evidence of bleeding.  He reports that she is feeling fine without acute complaints.  They will be coming in this Wednesday to have her PPD read; I advised him to stop by the lab and have blood drawn to check her hemoglobin.  She has known diverticulosis, which may be the source of the bleeding. I advised him to bring her in right away if she has recurrent bleeding.   If it does not recur, then further evaluation will likely not be needed.  I agree with leaving her off of the Aricept.

## 2015-01-23 NOTE — Telephone Encounter (Signed)
Pt presents with her son for TB skin test and states he wants to let dr Meredith Peljoines know that the donepezil that was ordered for pt has caused rectal bleeding, he stopped the med last Monday and has not had any since, he states she also had some blood in her urine at that time but none has been noted since then Started med on that Sunday took sun and mon evening, had the bleeding mon, son stopped med and none since Please advise or you may call [phone] to speak with the son, [name]

## 2015-01-24 DIAGNOSIS — Z111 Encounter for screening for respiratory tuberculosis: Secondary | ICD-10-CM

## 2015-01-24 DIAGNOSIS — Z23 Encounter for immunization: Secondary | ICD-10-CM | POA: Diagnosis present

## 2015-01-25 ENCOUNTER — Other Ambulatory Visit (INDEPENDENT_AMBULATORY_CARE_PROVIDER_SITE_OTHER): Payer: Medicare Other

## 2015-01-25 DIAGNOSIS — K921 Melena: Secondary | ICD-10-CM

## 2015-01-25 LAB — CBC WITH DIFFERENTIAL/PLATELET
BASOS PCT: 0 % (ref 0–1)
Basophils Absolute: 0 10*3/uL (ref 0.0–0.1)
Eosinophils Absolute: 0 10*3/uL (ref 0.0–0.7)
Eosinophils Relative: 0 % (ref 0–5)
HCT: 31 % — ABNORMAL LOW (ref 36.0–46.0)
Hemoglobin: 10.2 g/dL — ABNORMAL LOW (ref 12.0–15.0)
LYMPHS ABS: 1 10*3/uL (ref 0.7–4.0)
Lymphocytes Relative: 19 % (ref 12–46)
MCH: 30.1 pg (ref 26.0–34.0)
MCHC: 32.9 g/dL (ref 30.0–36.0)
MCV: 91.4 fL (ref 78.0–100.0)
MONOS PCT: 12 % (ref 3–12)
MPV: 11.5 fL (ref 8.6–12.4)
Monocytes Absolute: 0.6 10*3/uL (ref 0.1–1.0)
NEUTROS ABS: 3.7 10*3/uL (ref 1.7–7.7)
Neutrophils Relative %: 69 % (ref 43–77)
Platelets: 254 10*3/uL (ref 150–400)
RBC: 3.39 MIL/uL — ABNORMAL LOW (ref 3.87–5.11)
RDW: 17.1 % — AB (ref 11.5–15.5)
WBC: 5.4 10*3/uL (ref 4.0–10.5)

## 2015-01-25 LAB — TB SKIN TEST
Induration: 0 mm
TB Skin Test: NEGATIVE

## 2015-01-31 ENCOUNTER — Encounter: Payer: Self-pay | Admitting: Licensed Clinical Social Worker

## 2015-01-31 ENCOUNTER — Other Ambulatory Visit: Payer: Self-pay | Admitting: Internal Medicine

## 2015-01-31 ENCOUNTER — Encounter: Payer: Self-pay | Admitting: Internal Medicine

## 2015-01-31 NOTE — Progress Notes (Unsigned)
Patient ID: Meghan CuretMary M Zanni, female   DOB: 07/14/1926, 81 y.o.   MRN: [id number] Admission paperwork signed by PCP, awaiting return call from son to confirm diet texture.  Currently the telephone lines for [facility] are down, will try to fax again on 02/01/15.

## 2015-02-03 ENCOUNTER — Other Ambulatory Visit: Payer: Self-pay | Admitting: Internal Medicine

## 2015-02-03 DIAGNOSIS — G309 Alzheimer's disease, unspecified: Principal | ICD-10-CM

## 2015-02-03 DIAGNOSIS — D509 Iron deficiency anemia, unspecified: Secondary | ICD-10-CM

## 2015-02-03 DIAGNOSIS — M1000 Idiopathic gout, unspecified site: Secondary | ICD-10-CM

## 2015-02-03 DIAGNOSIS — F0280 Dementia in other diseases classified elsewhere without behavioral disturbance: Secondary | ICD-10-CM

## 2015-02-03 MED ORDER — MEMANTINE HCL 10 MG PO TABS: 10.0000 mg | ORAL_TABLET | Freq: Every day | ORAL | Status: DC

## 2015-02-03 MED ORDER — CALCIUM CARBONATE-VITAMIN D 600-400 MG-UNIT PO TABS: 1.0000 | ORAL_TABLET | Freq: Two times a day (BID) | ORAL | Status: DC

## 2015-02-03 MED ORDER — FERROUS GLUCONATE 225 (27 FE) MG PO TABS: 240.0000 mg | ORAL_TABLET | Freq: Two times a day (BID) | ORAL | Status: DC

## 2015-02-03 NOTE — Telephone Encounter (Signed)
On 01/23/2015, patient's son reported a small amount of bright red blood per rectum which then resolved.  Patient's hemoglobin on 01/25/15 was 10.2, down from previous value of 12.0 on 01/11/2015.  I spoke with son today by telephone, and he reports no further bright red blood per rectum.  Patient has a history of iron deficiency anemia; most recent ferritin was 52 on 01/11/2015.  Plan is to resume ferrous gluconate 225 mg 1 tablet twice a day and recheck CBC in 3 weeks.  I advised son to call right away if patient has any more bright red blood per rectum.  Patient has a known history of diverticulosis and is status post right hemicolectomy in 1991 reportedly done for a malignant polyp.  She has had multiple colonoscopies in the past by Dr. Juanda ChanceBrodie, most recently in 2009 which showed marked left colon diverticulosis with partial narrowing; no recall was planned by Dr. Juanda ChanceBrodie due to patient's age.  If patient has further bright red blood per rectum, referral back to Dr. Juanda ChanceBrodie may be warranted.  I also received a refill request for colchicine 0.6 mg daily.  On review of chart, and considering patient's chronic kidney disease with estimated GFR of 38 and the fact that she is on diltiazem, my plan is to reduce the colchicine dose to one half tablet (equals 0.3 mg) once daily.  I will check a uric acid level when patient returns in 3 weeks.  If the uric acid is at goal on her current dose of allopurinol, will consider stopping colchicine and continuing allopurinol alone.  Patient has had no recent gout flares.

## 2015-02-03 NOTE — Telephone Encounter (Signed)
I sent the colchicine prescription today for #30 tablets, which is a 60 day supply, with one refill.  I would prefer to keep the 60 day quantity for now since I just changed her dose.

## 2015-02-14 ENCOUNTER — Telehealth: Payer: Self-pay | Admitting: Licensed Clinical Social Worker

## 2015-02-14 NOTE — Telephone Encounter (Signed)
Pt's son, Lyn Hollingsheadlexander placed call to CSW this morning voicing concern [facility] has not received updated FL-2.  CSW informed Mr. Carey BullocksJarrell, PCP completed updated form last week and form was faxed over and confirmed.  However, CSW obtained copy of updated FL2 from front office, re-faxed and confirmed with [facility] admission of receipt.  Contacted son and notified.

## 2015-02-17 ENCOUNTER — Encounter: Payer: Self-pay | Admitting: *Deleted

## 2015-04-05 ENCOUNTER — Other Ambulatory Visit: Payer: Self-pay | Admitting: Internal Medicine

## 2015-04-05 ENCOUNTER — Other Ambulatory Visit: Payer: Self-pay | Admitting: *Deleted

## 2015-04-05 DIAGNOSIS — D509 Iron deficiency anemia, unspecified: Secondary | ICD-10-CM

## 2015-04-05 MED ORDER — DILTIAZEM HCL ER BEADS 420 MG PO CP24: 420.0000 mg | ORAL_CAPSULE | Freq: Every day | ORAL | Status: DC

## 2015-04-05 MED ORDER — COLCHICINE 0.6 MG PO TABS: 0.3000 mg | ORAL_TABLET | Freq: Every day | ORAL | Status: DC

## 2015-04-05 MED ORDER — MEMANTINE HCL 10 MG PO TABS: 10.0000 mg | ORAL_TABLET | Freq: Every day | ORAL | Status: DC

## 2015-04-11 ENCOUNTER — Ambulatory Visit (INDEPENDENT_AMBULATORY_CARE_PROVIDER_SITE_OTHER): Payer: Medicare Other | Admitting: Internal Medicine

## 2015-04-11 ENCOUNTER — Ambulatory Visit: Payer: Medicare Other | Admitting: Pulmonary Disease

## 2015-04-11 ENCOUNTER — Encounter: Payer: Self-pay | Admitting: Internal Medicine

## 2015-04-11 VITALS — BP 133/70 | HR 77 | Temp 97.6°F | Ht <= 58 in | Wt 122.1 lb

## 2015-04-11 DIAGNOSIS — G309 Alzheimer's disease, unspecified: Secondary | ICD-10-CM | POA: Diagnosis not present

## 2015-04-11 DIAGNOSIS — K5730 Diverticulosis of large intestine without perforation or abscess without bleeding: Secondary | ICD-10-CM

## 2015-04-11 DIAGNOSIS — I10 Essential (primary) hypertension: Secondary | ICD-10-CM

## 2015-04-11 DIAGNOSIS — D509 Iron deficiency anemia, unspecified: Secondary | ICD-10-CM

## 2015-04-11 DIAGNOSIS — F0280 Dementia in other diseases classified elsewhere without behavioral disturbance: Secondary | ICD-10-CM

## 2015-04-11 DIAGNOSIS — M1A9XX0 Chronic gout, unspecified, without tophus (tophi): Secondary | ICD-10-CM | POA: Diagnosis not present

## 2015-04-11 DIAGNOSIS — Z0000 Encounter for general adult medical examination without abnormal findings: Secondary | ICD-10-CM

## 2015-04-11 DIAGNOSIS — R600 Localized edema: Secondary | ICD-10-CM

## 2015-04-11 DIAGNOSIS — M1000 Idiopathic gout, unspecified site: Secondary | ICD-10-CM

## 2015-04-11 LAB — CBC WITH DIFFERENTIAL/PLATELET
BASOS PCT: 0 % (ref 0–1)
Basophils Absolute: 0 10*3/uL (ref 0.0–0.1)
EOS ABS: 0 10*3/uL (ref 0.0–0.7)
Eosinophils Relative: 1 % (ref 0–5)
HCT: 34 % — ABNORMAL LOW (ref 36.0–46.0)
HEMOGLOBIN: 11.3 g/dL — AB (ref 12.0–15.0)
LYMPHS ABS: 1.5 10*3/uL (ref 0.7–4.0)
Lymphocytes Relative: 31 % (ref 12–46)
MCH: 30.1 pg (ref 26.0–34.0)
MCHC: 33.2 g/dL (ref 30.0–36.0)
MCV: 90.4 fL (ref 78.0–100.0)
MONOS PCT: 12 % (ref 3–12)
MPV: 10.9 fL (ref 8.6–12.4)
Monocytes Absolute: 0.6 10*3/uL (ref 0.1–1.0)
Neutro Abs: 2.7 10*3/uL (ref 1.7–7.7)
Neutrophils Relative %: 56 % (ref 43–77)
Platelets: 278 10*3/uL (ref 150–400)
RBC: 3.76 MIL/uL — AB (ref 3.87–5.11)
RDW: 16.9 % — ABNORMAL HIGH (ref 11.5–15.5)
WBC: 4.8 10*3/uL (ref 4.0–10.5)

## 2015-04-11 LAB — URIC ACID: Uric Acid, Serum: 3.3 mg/dL (ref 2.4–7.0)

## 2015-04-11 MED ORDER — RIVASTIGMINE 4.6 MG/24HR TD PT24: 4.6000 mg | MEDICATED_PATCH | Freq: Every day | TRANSDERMAL | Status: DC

## 2015-04-11 MED ORDER — ZOSTER VACCINE LIVE 19400 UNT/0.65ML ~~LOC~~ SOLR: 0.6500 mL | Freq: Once | SUBCUTANEOUS | Status: DC

## 2015-04-11 NOTE — Assessment & Plan Note (Signed)
No recent gout attacks. Doing well. On allopurinol 200mg  daily + colcichine 0.3 mg daily.  Checking uric acid level today.

## 2015-04-11 NOTE — Patient Instructions (Signed)
You are doing great. Continue taking all of your medications.  Start using Exelon patch (1 patch for 24 hours) to help with your memory.  Go to your pharmacy to get your medication and also to get the Zoster Vaccine.

## 2015-04-11 NOTE — Addendum Note (Signed)
Addended by: Remus BlakeBARROW, Aara Jacquot K on: 04/11/2015 11:11 AM   Modules accepted: Orders

## 2015-04-11 NOTE — Assessment & Plan Note (Signed)
Ordered zostavax script to take to pharmacy.

## 2015-04-11 NOTE — Assessment & Plan Note (Addendum)
Has significant moderate dementia. TSH,b12, foalte were normal. Is on namenda currently 10mg  daily. Tried donepezil 5mg  in the past but did not take it as had some rectal bleeding (unlikely to be from this medication, however it was stopped anyway).  I added Exelon patch 4.6mg /24 hour today to her regimen.

## 2015-04-11 NOTE — Progress Notes (Signed)
   Subjective:    Patient ID: [name], female    DOB: 02/15/1926, 81 y.o.   MRN: [id number]  HPI  81 yo female with hx of gout, HTN, iron def anemia, CKD, osteopenia, and Alzheimer's moderate dementia here for follow up.   [facility].   Taking Namenda for her dementia. Not much improvement. Continues to have confusion and does not recognize family members.   Has some leg swelling after standing for long periods of time. No pain. No other complaints.    Review of Systems  Constitutional: Negative for fever, chills and fatigue.  HENT: Negative for sneezing and sore throat.   Eyes: Negative for photophobia and visual disturbance.  Respiratory: Negative for cough, shortness of breath and wheezing.   Cardiovascular: Positive for leg swelling. Negative for chest pain and palpitations.  Gastrointestinal: Negative for nausea, vomiting, diarrhea and abdominal distention.  Endocrine: Negative.  Negative for polyuria.  Genitourinary: Negative.   Musculoskeletal: Negative for arthralgias, gait problem and neck stiffness.  Skin: Negative.  Negative for rash.  Allergic/Immunologic: Negative.   Neurological: Negative for dizziness, weakness and headaches.  Psychiatric/Behavioral: Positive for confusion. Negative for suicidal ideas and agitation.       Objective:   Physical Exam  Constitutional: She is oriented to person, place, and time. She appears well-developed and well-nourished. No distress.  Pleasant female. Some confusion.   HENT:  Head: Normocephalic and atraumatic.  Right Ear: External ear normal.  Left Ear: External ear normal.  Mouth/Throat: Oropharynx is clear and moist.  Eyes: Conjunctivae and EOM are normal. Pupils are equal, round, and reactive to light. Right eye exhibits no discharge. Left eye exhibits no discharge. No scleral icterus.  Neck: Normal range of motion. Neck supple. No JVD present.  Cardiovascular:  Normal rate, regular rhythm and normal heart sounds.  Exam reveals no gallop and no friction rub.   No murmur heard. Pulmonary/Chest: Effort normal and breath sounds normal. No respiratory distress. She has no wheezes. She has no rales. She exhibits no tenderness.  Abdominal: Soft. Bowel sounds are normal. She exhibits no distension and no mass. There is no tenderness. There is no rebound and no guarding.  Musculoskeletal: Normal range of motion.  1+ pitting edema on both legs. No erythema, tenderness, warmth.   Neurological: She is alert and oriented to person, place, and time.  Skin: She is not diaphoretic.    Filed Vitals:   04/11/15 1039  BP: 133/70  Pulse: 77  Temp: 97.6 F (36.4 C)        Assessment & Plan:  See problem based a&p.

## 2015-04-11 NOTE — Assessment & Plan Note (Signed)
Had some BRBPR after starting donepezil last visit. It stopped after stopping the med. Dr. Meredith PelJoines thought it was likely 2/2 to her diverticulosis. He requested getting a CBC to make sure her hgb is stable, and it was around her baseline that time. He ordered another CBC which we will check today. Patient denies any more bleeding.

## 2015-04-11 NOTE — Assessment & Plan Note (Signed)
Leg swelling mainly after standing long periods of time. No erythema, tenderness, or warmth. No signs of infection.  Likely 2/2 to venous insufficiency from aging. Asked to wear compression stocking and to keep leg elevated.  Less likely to be DVt

## 2015-04-11 NOTE — Assessment & Plan Note (Signed)
On PO iron. Checking CBC today.

## 2015-04-11 NOTE — Assessment & Plan Note (Signed)
Filed Vitals:   04/11/15 1039  BP: 133/70  Pulse: 77  Temp: 97.6 F (36.4 C)    BP well controlled. Continue current losartan 25mg  daily + diltiazem 420mg  daily.

## 2015-04-12 ENCOUNTER — Telehealth: Payer: Self-pay | Admitting: Internal Medicine

## 2015-04-12 NOTE — Telephone Encounter (Signed)
Triage team notified me that the patient's son (he is the primary caregiver) wanted me to update him about his mother's visit from yesterday.  I discussed my plan with him.   He requested that in the future, all appointments should be confirmed with him as he wants to be with her for all of her appointments.  His number is [phone] [name](Alexander Jannell).

## 2015-04-13 ENCOUNTER — Telehealth: Payer: Self-pay | Admitting: *Deleted

## 2015-04-13 NOTE — Telephone Encounter (Signed)
[facility] [phone] called - needs an order to d/c Exelon and shingles vaccine  Request of son. States [facility] Redmond [facility]ripp [facility]. Order to d/c those meds faxed to [phone]. Stanton KidneyDebra Aika Brzoska RN 04/13/15 4:30PM

## 2015-04-14 NOTE — Telephone Encounter (Signed)
Rivastigmine and Zostavax discontinued within EPIC.  Please fax that information to [facility] per their request.  Thanks.

## 2015-04-17 NOTE — Telephone Encounter (Signed)
[facility] to [facility]: Westley Footsaynona - d/c Exelon and shingles vaccine.

## 2015-04-18 ENCOUNTER — Ambulatory Visit: Payer: Medicare Other | Admitting: Internal Medicine

## 2015-04-18 NOTE — Telephone Encounter (Signed)
On 04/18/15 10:50AM [place] [phone] needed orders to d/c Exelon patch and shingles vaccine. [facility]. Aware orders were faxed to attention [facility] 04/17/15 9:47AM. Stanton KidneyDebra Tamieka Rancourt RN 04/18/15 10:55AM

## 2015-04-24 ENCOUNTER — Telehealth: Payer: Self-pay | Admitting: *Deleted

## 2015-04-24 NOTE — Telephone Encounter (Signed)
nicole at facility that pt is residing at has called and states that pt's son has changed his mind and would like for [facility] to [facility], dr Tasia Catchingsahmed, they would like to fax orders to be signed since all orders were d'c last week when pt's son withdrew [facility] nicole [phone]

## 2015-04-30 ENCOUNTER — Other Ambulatory Visit: Payer: Self-pay | Admitting: Internal Medicine

## 2015-05-03 ENCOUNTER — Telehealth: Payer: Self-pay | Admitting: *Deleted

## 2015-05-03 NOTE — Telephone Encounter (Signed)
Facility calls and ask for an order for exlon patches, it is not on her med list, i see at last visit yoyu ask her to start using the patches but the script was discontinued

## 2015-05-04 ENCOUNTER — Encounter: Payer: Self-pay | Admitting: Internal Medicine

## 2015-05-04 ENCOUNTER — Ambulatory Visit: Payer: Medicare Other | Admitting: Internal Medicine

## 2015-05-04 ENCOUNTER — Ambulatory Visit (INDEPENDENT_AMBULATORY_CARE_PROVIDER_SITE_OTHER): Payer: Medicare Other | Admitting: Internal Medicine

## 2015-05-04 VITALS — BP 116/69 | HR 65 | Temp 98.2°F | Ht <= 58 in | Wt 126.5 lb

## 2015-05-04 DIAGNOSIS — F0280 Dementia in other diseases classified elsewhere without behavioral disturbance: Secondary | ICD-10-CM | POA: Diagnosis not present

## 2015-05-04 DIAGNOSIS — G309 Alzheimer's disease, unspecified: Secondary | ICD-10-CM

## 2015-05-04 DIAGNOSIS — Z0000 Encounter for general adult medical examination without abnormal findings: Secondary | ICD-10-CM

## 2015-05-04 MED ORDER — ZOSTER VACCINE LIVE 19400 UNT/0.65ML ~~LOC~~ SOLR: 0.6500 mL | Freq: Once | SUBCUTANEOUS | Status: DC

## 2015-05-04 MED ORDER — RIVASTIGMINE 4.6 MG/24HR TD PT24: 4.6000 mg | MEDICATED_PATCH | Freq: Every day | TRANSDERMAL | Status: DC

## 2015-05-04 NOTE — Assessment & Plan Note (Addendum)
Patient w/ reported agitation and worsening confusion this AM per the son. She is currently on Namenda only for her dementia. Patient had recently been prescribed an Exelon patch, however, the son was not informed of this medication change (had no accompanied patient to appointment that day) and wished that it be discontinued and that her medications be managed by Dr. Redmond Schoolripp at her facility. However, shortly thereafter, the son changed his mind and now wishes that the patient continue to be seen in [facility]. Informed the son that it may be beneficial for Dr. Redmond Schoolripp to manage the patient's intermittent delirium since he frequently manages patients w/ acute issues at the facility.  -Will re-prescribe Exelon patch 4.6 mg/24 hr to be replaced every day at 10 AM (time requested per facility) -Continue Namenda -May benefit from LOW dose Risperdal prn qhs for intermittent agitation, although do not feel this is necessary at this time and it may be beneficial to have Dr. Redmond Schoolripp manage her intermittent delirium given his ability to see her in a more acute setting. Discussed this with the son.  -Patient to return in 3 months or prn given mostly stable chronic medical conditions at this time.

## 2015-05-04 NOTE — Patient Instructions (Signed)
1. Please schedule a follow up visit in 3 months. Can return sooner if agitation continues to be an issue.   2. Please take all medications as previously prescribed with the following changes:  Start using Exelon patch daily.  3. If you have worsening of your symptoms or new symptoms arise, please call the clinic [phone]), or go to the ER immediately if symptoms are severe.

## 2015-05-04 NOTE — Telephone Encounter (Signed)
6/2 Call from facility, pt seems to be more confused and agitated, they would like something to calm her, advised pt must be seen. appt [phone] dr Yetta Barrejones.

## 2015-05-04 NOTE — Assessment & Plan Note (Signed)
Re-prescribed Zoster vaccine per son's request.

## 2015-05-04 NOTE — Progress Notes (Signed)
Subjective:   Patient ID: [name] female   DOB: Dec 31, 1925 81 y.o.   MRN: [id number]  HPI: [name] is a 81 y.o. female w/ PMHx of HTN, CKD, IDA, Gout, Osteopenia, h/o Colon CA s/p hemicolectomy, and dementia, presents to the clinic today for an acute visit for increased confusion and agitation. Patient accompanied by her son today. On exam, she is quite calm, answers questions appropriately, is quite pleasant. Per the son, the patient had an episode of agitation this AM in which she refused to take her medications, may have attempted to hit some staff and seemed a bit more confused than her baseline. This is quite different for her according to the son. She is currently taking Namenda for her dementia and had previously bee taking Aricept, however this had been discontinued b/c of some rectal bleeding (not thought to actually be related). During her last visit w/ Dr. Tasia Catchings on 04/11/15, she had been prescribed Exelon patch, however, the son was upset that he was not informed of this medication change and wished for the patch to be discontinued and Dr. Redmond School will manage the patient's medications. However, then shortly thereafter, he changed his mind and now wishes that the patient continue to be managed at the [facility] clinic.   Current Outpatient Prescriptions  Medication Sig Dispense Refill  . allopurinol (ZYLOPRIM) 100 MG tablet TAKE 2 TABLETS (200 MG TOTAL) BY MOUTH DAILY. 182 tablet 0  . Calcium Carbonate-Vit D-Min 600-400 MG-UNIT TABS Take 1 tablet by mouth 2 (two) times daily. (Patient not taking: Reported on 01/11/2015) 60 each 11  . Calcium Carbonate-Vitamin D 600-400 MG-UNIT per tablet Take 1 tablet by mouth 2 (two) times daily. 60 tablet 3  . colchicine (COLCRYS) 0.6 MG tablet Take 0.5 tablets (0.3 mg total) by mouth daily. 30 tablet 5  . diltiazem (TIAZAC) 420 MG 24 hr capsule Take 1 capsule (420 mg total) by mouth daily. 90 capsule 1  . diltiazem (TIAZAC) 420 MG 24 hr capsule  TAKE 1 CAPSULE (420 MG TOTAL) BY MOUTH DAILY. 90 capsule 1  . ferrous gluconate (FERGON) 225 (27 FE) MG tablet Take 1 tablet (240 mg total) by mouth 2 (two) times daily with a meal. 60 each 3  . losartan (COZAAR) 50 MG tablet TAKE 0.5 TABLETS (25 MG TOTAL) BY MOUTH DAILY. 45 tablet 1  . memantine (NAMENDA) 10 MG tablet Take 1 tablet (10 mg total) by mouth daily. 90 tablet 3  . rivastigmine (EXELON) 4.6 mg/24hr Place 1 patch (4.6 mg total) onto the skin daily. Please replace patch at same time 10 AM daily 30 patch 5  . zoster vaccine live, PF, (ZOSTAVAX) 95621 UNT/0.65ML injection Inject 19,400 Units into the skin once. 1 each 0   No current facility-administered medications for this visit.   Review of Systems (per son and patient)  General: Denies fever, diaphoresis, appetite change, and fatigue.  Respiratory: Denies SOB, cough, and wheezing.   Cardiovascular: Denies chest pain and palpitations.  Gastrointestinal: Denies nausea, vomiting, abdominal pain, and diarrhea Musculoskeletal: Denies myalgias, arthralgias, back pain, and gait problem.  Neurological: Denies dizziness, syncope, weakness, lightheadedness, and headaches.  Psychiatric/Behavioral: Positive for confusion, agitation. Denies sleep disturbance.   Objective:   Physical Exam: Filed Vitals:   05/04/15 1359  BP: 116/69  Pulse: 65  Temp: 98.2 F (36.8 C)  TempSrc: Oral  Height:  (1.448 m)  Weight: 126 lb 8 oz (57.38 kg)  SpO2: 99%   General: Elderly  AA female, alert, cooperative, NAD. HEENT: PERRL, EOMI. Moist mucus membranes Neck: Full range of motion without pain, supple, no lymphadenopathy or carotid bruits Lungs: Clear to ascultation bilaterally, normal work of respiration, no wheezes, rales, rhonchi Heart: RRR, no murmurs, gallops, or rubs Abdomen: Soft, non-tender, non-distended, BS + Extremities: No cyanosis, clubbing, or edema Neurologic: Alert & oriented x2, cranial nerves II-XII intact, strength  grossly intact, sensation intact to light touch   Assessment & Plan:   Please see problem based assessment and plan.

## 2015-05-07 NOTE — Progress Notes (Signed)
INTERNAL MEDICINE TEACHING ATTENDING ADDENDUM - Tamari Redwine, MD: I reviewed and discussed at the time of visit with the resident Dr. Ahmed, the patient's medical history, physical examination, diagnosis and results of pertinent tests and treatment and I agree with the patient's care as documented.  

## 2015-05-09 NOTE — Progress Notes (Signed)
Internal Medicine Clinic Attending  Case discussed with Dr. Jones soon after the resident saw the patient.  We reviewed the resident's history and exam and pertinent patient test results.  I agree with the assessment, diagnosis, and plan of care documented in the resident's note. 

## 2015-05-24 ENCOUNTER — Encounter: Payer: Self-pay | Admitting: Internal Medicine

## 2015-06-23 ENCOUNTER — Other Ambulatory Visit: Payer: Self-pay | Admitting: Internal Medicine

## 2015-07-05 ENCOUNTER — Emergency Department (HOSPITAL_COMMUNITY)
Admission: EM | Admit: 2015-07-05 | Discharge: 2015-07-05 | Disposition: A | Discharge status: Home / Self Care | Payer: Medicare Other | Attending: Emergency Medicine | Admitting: Emergency Medicine

## 2015-07-05 ENCOUNTER — Encounter (HOSPITAL_COMMUNITY): Payer: Self-pay | Admitting: General Practice

## 2015-07-05 ENCOUNTER — Emergency Department (HOSPITAL_COMMUNITY): Payer: Medicare Other

## 2015-07-05 DIAGNOSIS — R079 Chest pain, unspecified: Secondary | ICD-10-CM | POA: Diagnosis present

## 2015-07-05 DIAGNOSIS — Z87448 Personal history of other diseases of urinary system: Secondary | ICD-10-CM | POA: Diagnosis not present

## 2015-07-05 DIAGNOSIS — I10 Essential (primary) hypertension: Secondary | ICD-10-CM | POA: Insufficient documentation

## 2015-07-05 DIAGNOSIS — F0390 Unspecified dementia without behavioral disturbance: Secondary | ICD-10-CM | POA: Diagnosis not present

## 2015-07-05 DIAGNOSIS — D509 Iron deficiency anemia, unspecified: Secondary | ICD-10-CM | POA: Insufficient documentation

## 2015-07-05 DIAGNOSIS — Z79899 Other long term (current) drug therapy: Secondary | ICD-10-CM | POA: Insufficient documentation

## 2015-07-05 DIAGNOSIS — M109 Gout, unspecified: Secondary | ICD-10-CM | POA: Insufficient documentation

## 2015-07-05 DIAGNOSIS — Z85038 Personal history of other malignant neoplasm of large intestine: Secondary | ICD-10-CM | POA: Diagnosis not present

## 2015-07-05 DIAGNOSIS — Z8669 Personal history of other diseases of the nervous system and sense organs: Secondary | ICD-10-CM | POA: Diagnosis not present

## 2015-07-05 DIAGNOSIS — R0789 Other chest pain: Secondary | ICD-10-CM | POA: Diagnosis not present

## 2015-07-05 DIAGNOSIS — Z8639 Personal history of other endocrine, nutritional and metabolic disease: Secondary | ICD-10-CM | POA: Insufficient documentation

## 2015-07-05 DIAGNOSIS — Z86010 Personal history of colonic polyps: Secondary | ICD-10-CM | POA: Diagnosis not present

## 2015-07-05 DIAGNOSIS — Z8719 Personal history of other diseases of the digestive system: Secondary | ICD-10-CM | POA: Insufficient documentation

## 2015-07-05 LAB — URINALYSIS, ROUTINE W REFLEX MICROSCOPIC
BILIRUBIN URINE: NEGATIVE
Glucose, UA: NEGATIVE mg/dL
Hgb urine dipstick: NEGATIVE
KETONES UR: NEGATIVE mg/dL
LEUKOCYTES UA: NEGATIVE
NITRITE: NEGATIVE
PH: 7 (ref 5.0–8.0)
PROTEIN: NEGATIVE mg/dL
SPECIFIC GRAVITY, URINE: 1.015 (ref 1.005–1.030)
UROBILINOGEN UA: 0.2 mg/dL (ref 0.0–1.0)

## 2015-07-05 LAB — COMPREHENSIVE METABOLIC PANEL
ALK PHOS: 81 U/L (ref 38–126)
ALT: 12 U/L — AB (ref 14–54)
ANION GAP: 9 (ref 5–15)
AST: 26 U/L (ref 15–41)
Albumin: 3.9 g/dL (ref 3.5–5.0)
BUN: 47 mg/dL — ABNORMAL HIGH (ref 6–20)
CHLORIDE: 102 mmol/L (ref 101–111)
CO2: 27 mmol/L (ref 22–32)
Calcium: 9.1 mg/dL (ref 8.9–10.3)
Creatinine, Ser: 1.57 mg/dL — ABNORMAL HIGH (ref 0.44–1.00)
GFR, EST AFRICAN AMERICAN: 33 mL/min — AB (ref >60)
GFR, EST NON AFRICAN AMERICAN: 29 mL/min — AB (ref >60)
GLUCOSE: 81 mg/dL (ref 65–99)
Potassium: 4.1 mmol/L (ref 3.5–5.1)
Sodium: 138 mmol/L (ref 135–145)
TOTAL PROTEIN: 6.6 g/dL (ref 6.5–8.1)
Total Bilirubin: 1 mg/dL (ref 0.3–1.2)

## 2015-07-05 LAB — CBC
HEMATOCRIT: 35.4 % — AB (ref 36.0–46.0)
HEMOGLOBIN: 11.9 g/dL — AB (ref 12.0–15.0)
MCH: 29.3 pg (ref 26.0–34.0)
MCHC: 33.6 g/dL (ref 30.0–36.0)
MCV: 87.2 fL (ref 78.0–100.0)
PLATELETS: 180 10*3/uL (ref 150–400)
RBC: 4.06 MIL/uL (ref 3.87–5.11)
RDW: 16 % — ABNORMAL HIGH (ref 11.5–15.5)
WBC: 4.7 10*3/uL (ref 4.0–10.5)

## 2015-07-05 LAB — PROTIME-INR
INR: 1.11 (ref 0.00–1.49)
Prothrombin Time: 14.5 seconds (ref 11.6–15.2)

## 2015-07-05 LAB — TROPONIN I: Troponin I: <0.03 ng/mL (ref <0.031)

## 2015-07-05 LAB — I-STAT TROPONIN, ED: Troponin i, poc: 0.01 ng/mL (ref 0.00–0.08)

## 2015-07-05 NOTE — ED Provider Notes (Signed)
CSN: [id number]     Arrival date & time 07/05/15  0850 History   First MD Initiated Contact with Patient 07/05/15 [phone]     Chief Complaint  Patient presents with  . Chest Pain     (Consider location/radiation/quality/duration/timing/severity/associated sxs/prior Treatment) HPI  Patient presents after one episode of chest pain. Patient is a nursing home resident, has dementia, level V caveat. Per report the patient will chest pain about 3 hours ago.  The pain was central, right-sided, worse with inspiration and motion. Pain resolved after the patient received aspirin, one nitroglycerin tablet. Patient only recalls being cold earlier today, denies any current complaints. No nursing home report of other new changes in condition, nor medication.  Past Medical History  Diagnosis Date  . Hypertension   . Renal insufficiency   . Iron deficiency anemia   . Gout   . Osteopenia     DXA bone density scan on 10/16/2009 showed osteopenia with an AP spine T-score of -1.5, left femoral neck T-score of -1.7, and right femoral neck T-score of -2.0.  Her FRAX score gave an estimated ten-year probability of major osteoporotic fracture of 7.3%, and a ten-year probability of hip fracture of 2.1%.  . Vitamin D deficiency     Vitamin D (25-Hydroxy) <10 ng/mL on 12/07/2009.  . Diverticulosis of colon   . Malignant neoplasm of colon 1991    S/P right hemicolectomy in 1991 by Dr. Jamey Ripa, reportedly done for a malignant polyp; colonoscopy by Dr. [name] 02/09/2008 showed  marked left colon diverticulosis with partial narrowing, S/P  remote right hemicolectomy.  . Adenomatous colon polyp   . Lower GI bleed   . Gastric ulcer 1991    H. pylori positive in 1991; EGD 01/31/00 by Dr. Juanda Chance showed mild benign distal esophageal stricture (dilator passed), gastritis with evidence of coffee-ground material, and a 4 cm non-reducible hiatal hernia; EGD  02/09/2008 showed hiatal hernia, and mild benign distal espohageal  stricture, was otherwise normal.  . Elevated alkaline phosphatase level     Chronic; GGT was normal (30) on 09/18/2004.  [name] Constipation   . Tinnitus, bilateral     Audiological evaluation on February 05, 2007 showed a slight to mild loss of hearing in both ears through 4000 Hz, with a moderate to severe precipitous drop in the highest frequencies (6000 to 8000 Hz); her speech recognition abilities were within normal limits and a slightly elevated conversational level.  . Sensorineural hearing loss of both ears     Audiological evaluation on February 05, 2007 showed a slight to mild loss in both ears through 4000 Hz, with a moderate to severe precipitous drop in the highest frequencies (6000 to 8000 Hz).  . Endometrial polyp     S/P operative hysteroscopy with removal of polyps, dilatation and curettage on 10/20/2000 by Dr. Antionette Char.  [name] Unsteady gait 08/07/2011  . Hiatal hernia   . Esophageal stricture   . Cholelithiasis 01/13/2015   Past Surgical History  Procedure Laterality Date  . Hemicolectomy  1991    Right -  with ileocolic anastamosis by Dr. Jamey Ripa  . Hysteroscopy      S/P operative hysteroscopy with removal of polyps, dilatation and curettage on 10/20/2000 by Dr. Antionette Char.   Family History  Problem Relation Age of Onset  . Breast cancer Maternal Aunt   . Colon cancer Neg Hx    History  Substance Use Topics  . Smoking status: Never Smoker   . Smokeless tobacco: Never Used  .  Alcohol Use: No   OB History    Gravida Para Term Preterm AB TAB SAB Ectopic Multiple Living   Review of Systems  Unable to perform ROS: Dementia      Allergies  Review of patient's allergies indicates no known allergies.  Home Medications   Prior to Admission medications   Medication Sig Start Date End Date Taking? Authorizing Provider  allopurinol (ZYLOPRIM) 100 MG tablet TAKE 2 TABLETS (200 MG TOTAL) BY MOUTH DAILY. 04/05/15  Yes Earl Lagos, MD  Calcium  Carbonate-Vitamin D 600-400 MG-UNIT per tablet Take 1 tablet by mouth 2 (two) times daily. 02/03/15  Yes Margarito Liner, MD  colchicine (COLCRYS) 0.6 MG tablet Take 0.5 tablets (0.3 mg total) by mouth daily. 04/05/15  Yes Nischal Heide Spark, MD  diltiazem (TIAZAC) 420 MG 24 hr capsule Take 1 capsule (420 mg total) by mouth daily. 04/05/15  Yes Earl Lagos, MD  ferrous gluconate (FERGON) 225 (27 FE) MG tablet Take 1 tablet (240 mg total) by mouth 2 (two) times daily with a meal. 02/03/15  Yes Margarito Liner, MD  losartan (COZAAR) 50 MG tablet TAKE 0.5 TABLETS (25 MG TOTAL) BY MOUTH DAILY. 05/02/15  Yes Tasrif Ahmed, MD  memantine (NAMENDA) 10 MG tablet Take 1 tablet (10 mg total) by mouth daily. 04/05/15  Yes Earl Lagos, MD  rivastigmine (EXELON) 4.6 mg/24hr Place 1 patch (4.6 mg total) onto the skin daily. Please replace patch at same time 10 AM daily 05/04/15  Yes Courtney Paris, MD   BP 138/76 mmHg  Pulse 60  Temp(Src) 97.4 F (36.3 C) (Oral)  Resp 18  SpO2 99% Physical Exam  Constitutional: She appears well-developed and well-nourished. No distress.  HENT:  Head: Normocephalic and atraumatic.  Eyes: Conjunctivae and EOM are normal.  Cardiovascular: Normal rate and regular rhythm.   Pulmonary/Chest: Effort normal and breath sounds normal. No stridor. No respiratory distress.  Abdominal: She exhibits no distension.  Musculoskeletal: She exhibits no edema.  Neurological: She is alert. She displays atrophy. She displays no tremor. No cranial nerve deficit. She exhibits abnormal muscle tone. She displays no seizure activity. Coordination normal.  Skin: Skin is warm and dry.  Psychiatric: Her speech is delayed. She is slowed and withdrawn. Cognition and memory are impaired.  Nursing note and vitals reviewed.   ED Course  Procedures (including critical care time) Labs Review Labs Reviewed  CBC - Abnormal; Notable for the following:    Hemoglobin 11.9 (*)    HCT 35.4 (*)    RDW 16.0 (*)    All  other components within normal limits  COMPREHENSIVE METABOLIC PANEL - Abnormal; Notable for the following:    BUN 47 (*)    Creatinine, Ser 1.57 (*)    ALT 12 (*)    GFR calc non Af Amer 29 (*)    GFR calc Af Amer 33 (*)    All other components within normal limits  PROTIME-INR  URINALYSIS, ROUTINE W REFLEX MICROSCOPIC (NOT AT [facility])  TROPONIN I  Rosezena Sensor, ED    Imaging Review Dg Chest 2 View  07/05/2015   CLINICAL DATA:  Chest pain.  EXAM: CHEST  2 VIEW  COMPARISON:  None.  FINDINGS: Mild cardiomegaly. hiatal hernia present. Adjacent left lower lobe opacity could reflect atelectasis or infiltrate. Trace left pleural effusion. Right lung is clear. No acute bony abnormality.  IMPRESSION: Hiatal hernia. Left lower lobe atelectasis or infiltrate  with trace left pleural effusion.   Electronically Signed   By: Charlett Nose M.D.   On: 07/05/2015 09:35     EKG Interpretation   Date/Time:  Wednesday July 05 2015 09:00:36 EDT Ventricular Rate:  59 PR Interval:  189 QRS Duration: 95 QT Interval:  430 QTC Calculation: 426 R Axis:   -10 Text Interpretation:  Sinus rhythm Left atrial enlargement Inferior  infarct, acute (LCx) ST elevation, consider anterolateral injury Sinus  rhythm ST-t wave abnormality Artifact Abnormal ekg Confirmed by Gerhard Munch  MD ([id number]) on 07/05/2015 9:04:11 AM     Cardiac 60 sinus normal Pulse ox 99% room air normal Chart review demonstrates recent GI bleed, likely diverticular in origin, but with some consideration of medication related. Patient is not currently taking antiplatelets agent, nor anticoagulate.  1:18 PM Patient remains in no distress.   MDM  Elderly female presents after an episode of chest pain. Here the patient is awake, alert, though disoriented, she is in no distress, and appears to be at baseline. Patient has a history of hypertension, atrial fibrillation, and today's episode may have been vasospasm, but with 2 normal  troponins, nonischemic EKG, no pain throughout hours of monitoring, there is low suspicion for ongoing coronary ischemia. Additionally, the patient has optimally medically managed, and with recent GI bleed, does not appear to be a candidate for additional anticoagulation, antiplatelets therapy. Patient discharged to her nursing facility in stable condition.  Gerhard Munch, MD 07/05/15 1319

## 2015-07-05 NOTE — Discharge Instructions (Signed)
As discussed, your evaluation today has been largely reassuring.  But, it is important that you monitor your condition carefully, and do not hesitate to return to the ED if you develop new, or concerning changes in your condition. ° °Otherwise, please follow-up with your physician for appropriate ongoing care. ° °Chest Pain (Nonspecific) °It is often hard to give a diagnosis for the cause of chest pain. There is always a chance that your pain could be related to something serious, such as a heart attack or a blood clot in the lungs. You need to follow up with your doctor. °HOME CARE °· If antibiotic medicine was given, take it as directed by your doctor. Finish the medicine even if you start to feel better. °· For the next few days, avoid activities that bring on chest pain. Continue physical activities as told by your doctor. °· Do not use any tobacco products. This includes cigarettes, chewing tobacco, and e-cigarettes. °· Avoid drinking alcohol. °· Only take medicine as told by your doctor. °· Follow your doctor's suggestions for more testing if your chest pain does not go away. °· Keep all doctor visits you made. °GET HELP IF: °· Your chest pain does not go away, even after treatment. °· You have a rash with blisters on your chest. °· You have a fever. °GET HELP RIGHT AWAY IF:  °· You have more pain or pain that spreads to your arm, neck, jaw, back, or belly (abdomen). °· You have shortness of breath. °· You cough more than usual or cough up blood. °· You have very bad back or belly pain. °· You feel sick to your stomach (nauseous) or throw up (vomit). °· You have very bad weakness. °· You pass out (faint). °· You have chills. °This is an emergency. Do not wait to see if the problems will go away. Call your local emergency services (911 in [place] Do not drive yourself to the hospital. °MAKE SURE YOU:  °· Understand these instructions. °· Will watch your condition. °· Will get help right away if you are not doing  well or get worse. °Document Released: 05/06/2008 Document Revised: 11/23/2013 Document Reviewed: 05/06/2008 °ExitCare® Patient Information ©2015 [employer]. This information is not intended to replace advice given to you by your health care provider. Make sure you discuss any questions you have with your health care provider. ° °

## 2015-07-05 NOTE — ED Notes (Signed)
Pt brought in via [facility] with complaints of centralized chest pain that started at 0630 this morning. Pt reporting pain radiating to her right chest and its worse with inspiration and movement. Pt has a history of dementia, and is unable to rate pain on a 0/10 scale. Pt received  of ASA and 1 nitroglycerin tablet that made patient pain free. Pt has a history of atrial fibrillation, and hypertension. Pt is not currently taking any blood thinners. Pt presents from [place] (SNF).

## 2015-07-28 ENCOUNTER — Other Ambulatory Visit: Payer: Self-pay | Admitting: Internal Medicine

## 2015-09-04 ENCOUNTER — Other Ambulatory Visit: Payer: Self-pay | Admitting: *Deleted

## 2015-09-04 DIAGNOSIS — D509 Iron deficiency anemia, unspecified: Secondary | ICD-10-CM

## 2015-09-04 MED ORDER — FERROUS SULFATE 325 (65 FE) MG PO TABS: 325.0000 mg | ORAL_TABLET | Freq: Two times a day (BID) | ORAL | Status: DC

## 2015-09-04 NOTE — Telephone Encounter (Signed)
Pharmacy states they can not get Ferrous gluconate any more.  They do have Ferrous Sulfate 325 mg.  Would you consider the change?

## 2015-10-08 ENCOUNTER — Other Ambulatory Visit: Payer: Self-pay | Admitting: Internal Medicine

## 2015-10-09 ENCOUNTER — Encounter: Payer: Self-pay | Admitting: Licensed Clinical Social Worker

## 2015-10-09 NOTE — Progress Notes (Signed)
Patient ID: Meghan CuretMary M Duncan, female   DOB: 05/21/1926, 81 y.o.   MRN: [id number] CSW received FL-2 and Pharmacy communication from [facility].  [name] is currently at resident at this facility.  CSW will forward to PCP, if physician would like patient to come in for an appointment or utilize patients last office visit on 05/2015.

## 2015-10-09 NOTE — Progress Notes (Signed)
An appt has been made 10/30/15 2:45PM with Dr Tasia CatchingsAhmed. Home phone is not accepting calls at this time.

## 2015-10-09 NOTE — Progress Notes (Signed)
I can try to use the last visit but I would prefer patient to come in. If she can't come see me in 1 month or so then I will try to work from the chart review.

## 2015-10-10 NOTE — Progress Notes (Signed)
Dr. Tasia CatchingsAhmed, I am placing the forms in your mailbox.  In addition to the FL-2, there is a Teacher, adult educationpharmacist consultant communication form regarding a request to change a medication.

## 2015-10-10 NOTE — Progress Notes (Signed)
Son's phone number is not in service [phone]. Talked with nurse taking care of pt [phone]  and aware of appt with Dr Tasia CatchingsAhmed 10/30/15 2:45PM - she was to call son about transportation.

## 2015-10-22 ENCOUNTER — Encounter (HOSPITAL_COMMUNITY): Payer: Self-pay | Admitting: *Deleted

## 2015-10-22 ENCOUNTER — Emergency Department (HOSPITAL_COMMUNITY): Payer: Medicare Other

## 2015-10-22 ENCOUNTER — Emergency Department (HOSPITAL_COMMUNITY)
Admission: EM | Admit: 2015-10-22 | Discharge: 2015-10-23 | Disposition: A | Discharge status: Home / Self Care | Payer: Medicare Other | Attending: Emergency Medicine | Admitting: Emergency Medicine

## 2015-10-22 DIAGNOSIS — Z79899 Other long term (current) drug therapy: Secondary | ICD-10-CM | POA: Insufficient documentation

## 2015-10-22 DIAGNOSIS — D649 Anemia, unspecified: Secondary | ICD-10-CM | POA: Insufficient documentation

## 2015-10-22 DIAGNOSIS — R2243 Localized swelling, mass and lump, lower limb, bilateral: Secondary | ICD-10-CM | POA: Diagnosis not present

## 2015-10-22 DIAGNOSIS — R001 Bradycardia, unspecified: Secondary | ICD-10-CM | POA: Insufficient documentation

## 2015-10-22 DIAGNOSIS — K5900 Constipation, unspecified: Secondary | ICD-10-CM | POA: Diagnosis not present

## 2015-10-22 DIAGNOSIS — Z85038 Personal history of other malignant neoplasm of large intestine: Secondary | ICD-10-CM | POA: Diagnosis not present

## 2015-10-22 DIAGNOSIS — R079 Chest pain, unspecified: Secondary | ICD-10-CM | POA: Diagnosis present

## 2015-10-22 DIAGNOSIS — M109 Gout, unspecified: Secondary | ICD-10-CM | POA: Diagnosis not present

## 2015-10-22 DIAGNOSIS — Z87448 Personal history of other diseases of urinary system: Secondary | ICD-10-CM | POA: Diagnosis not present

## 2015-10-22 DIAGNOSIS — I10 Essential (primary) hypertension: Secondary | ICD-10-CM | POA: Diagnosis not present

## 2015-10-22 DIAGNOSIS — E559 Vitamin D deficiency, unspecified: Secondary | ICD-10-CM | POA: Insufficient documentation

## 2015-10-22 DIAGNOSIS — F0390 Unspecified dementia without behavioral disturbance: Secondary | ICD-10-CM | POA: Insufficient documentation

## 2015-10-22 DIAGNOSIS — Z8669 Personal history of other diseases of the nervous system and sense organs: Secondary | ICD-10-CM | POA: Diagnosis not present

## 2015-10-22 DIAGNOSIS — Z86010 Personal history of colonic polyps: Secondary | ICD-10-CM | POA: Insufficient documentation

## 2015-10-22 LAB — I-STAT TROPONIN, ED
TROPONIN I, POC: 0.01 ng/mL (ref 0.00–0.08)
Troponin i, poc: 0.02 ng/mL (ref 0.00–0.08)

## 2015-10-22 LAB — BASIC METABOLIC PANEL
ANION GAP: 9 (ref 5–15)
BUN: 55 mg/dL — ABNORMAL HIGH (ref 6–20)
CALCIUM: 9.1 mg/dL (ref 8.9–10.3)
CO2: 28 mmol/L (ref 22–32)
CREATININE: 2.34 mg/dL — AB (ref 0.44–1.00)
Chloride: 96 mmol/L — ABNORMAL LOW (ref 101–111)
GFR calc Af Amer: 20 mL/min — ABNORMAL LOW (ref >60)
GFR calc non Af Amer: 18 mL/min — ABNORMAL LOW (ref >60)
GLUCOSE: 109 mg/dL — AB (ref 65–99)
POTASSIUM: 4.4 mmol/L (ref 3.5–5.1)
Sodium: 133 mmol/L — ABNORMAL LOW (ref 135–145)

## 2015-10-22 LAB — CBC
HCT: 32.5 % — ABNORMAL LOW (ref 36.0–46.0)
Hemoglobin: 11 g/dL — ABNORMAL LOW (ref 12.0–15.0)
MCH: 30.2 pg (ref 26.0–34.0)
MCHC: 33.8 g/dL (ref 30.0–36.0)
MCV: 89.3 fL (ref 78.0–100.0)
PLATELETS: 190 10*3/uL (ref 150–400)
RBC: 3.64 MIL/uL — ABNORMAL LOW (ref 3.87–5.11)
RDW: 14.7 % (ref 11.5–15.5)
WBC: 6.3 10*3/uL (ref 4.0–10.5)

## 2015-10-22 NOTE — ED Notes (Signed)
Pt son, Alexander JaKyra Leylandrrell [phone]

## 2015-10-22 NOTE — ED Notes (Signed)
[facility], per report pt was eating dinner & started to have bil CP under her breasts, pt appears Sob upon arrival to ED, pt has Exelon on L upper arm, pt denies n/v/d, pt hx of dementia

## 2015-10-22 NOTE — ED Provider Notes (Signed)
CSN: [id number]     Arrival date & time 10/22/15  1805 History   First MD Initiated Contact with Patient 10/22/15 1819     Chief Complaint  Patient presents with  . Chest Pain   level V caveat dementia. History is obtained from Darliss Ridgel, med tech at skilled nursing facility   (Consider location/radiation/quality/duration/timing/severity/associated sxs/prior Treatment) HPI Patient was sitting at her dinner table today when she grabbed her chest and complained of chest pain. EMS was called. No treatment prior to coming here. Patient is presently asymptomatic and has no recall of having had any chest pain. Past Medical History  Diagnosis Date  . Hypertension   . Renal insufficiency   . Iron deficiency anemia   . Gout   . Osteopenia     DXA bone density scan on 10/16/2009 showed osteopenia with an AP spine T-score of -1.5, left femoral neck T-score of -1.7, and right femoral neck T-score of -2.0.  Her FRAX score gave an estimated ten-year probability of major osteoporotic fracture of 7.3%, and a ten-year probability of hip fracture of 2.1%.  . Vitamin D deficiency     Vitamin D (25-Hydroxy) <10 ng/mL on 12/07/2009.  . Diverticulosis of colon   . Malignant neoplasm of colon 1991    S/P right hemicolectomy in 1991 by Dr. Jamey Ripa, reportedly done for a malignant polyp; colonoscopy by Dr. [name] 02/09/2008 showed  marked left colon diverticulosis with partial narrowing, S/P  remote right hemicolectomy.  . Adenomatous colon polyp   . Lower GI bleed   . Gastric ulcer 1991    H. pylori positive in 1991; EGD 01/31/00 by Dr. Juanda Chance showed mild benign distal esophageal stricture (dilator passed), gastritis with evidence of coffee-ground material, and a 4 cm non-reducible hiatal hernia; EGD  02/09/2008 showed hiatal hernia, and mild benign distal espohageal stricture, was otherwise normal.  . Elevated alkaline phosphatase level     Chronic; GGT was normal (30) on 09/18/2004.  [name] Constipation    . Tinnitus, bilateral     Audiological evaluation on February 05, 2007 showed a slight to mild loss of hearing in both ears through 4000 Hz, with a moderate to severe precipitous drop in the highest frequencies (6000 to 8000 Hz); her speech recognition abilities were within normal limits and a slightly elevated conversational level.  . Sensorineural hearing loss of both ears     Audiological evaluation on February 05, 2007 showed a slight to mild loss in both ears through 4000 Hz, with a moderate to severe precipitous drop in the highest frequencies (6000 to 8000 Hz).  . Endometrial polyp     S/P operative hysteroscopy with removal of polyps, dilatation and curettage on 10/20/2000 by Dr. Antionette Char.  [name] Unsteady gait 08/07/2011  . Hiatal hernia   . Esophageal stricture   . Cholelithiasis 01/13/2015   Past Surgical History  Procedure Laterality Date  . Hemicolectomy  1991    Right -  with ileocolic anastamosis by Dr. Jamey Ripa  . Hysteroscopy      S/P operative hysteroscopy with removal of polyps, dilatation and curettage on 10/20/2000 by Dr. Antionette Char.   Family History  Problem Relation Age of Onset  . Breast cancer Maternal Aunt   . Colon cancer Neg Hx    Social History  Substance Use Topics  . Smoking status: Never Smoker   . Smokeless tobacco: Never Used  . Alcohol Use: No   OB History    Gravida Para Term Preterm AB TAB  SAB Ectopic Multiple Living   6 6 6       6      Review of Systems  Unable to perform ROS: Dementia  Cardiovascular: Positive for chest pain.      Allergies  Review of patient's allergies indicates no known allergies.  Home Medications   Prior to Admission medications   Medication Sig Start Date End Date Taking? Authorizing Provider  allopurinol (ZYLOPRIM) 100 MG tablet TAKE 2 TABLETS (200 MG TOTAL) BY MOUTH DAILY. 07/31/15   Hyacinth Meekerasrif Ahmed, MD  Calcium Carbonate-Vitamin D 600-400 MG-UNIT per tablet Take 1 tablet by mouth 2 (two) times daily.  02/03/15   Margarito LinerJerry Joines, MD  colchicine (COLCRYS) 0.6 MG tablet Take 0.5 tablets (0.3 mg total) by mouth daily. 04/05/15   Nischal Heide SparkNarendra, MD  diltiazem (TIAZAC) 420 MG 24 hr capsule Take 1 capsule (420 mg total) by mouth daily. 04/05/15   Nischal Heide SparkNarendra, MD  ferrous gluconate (FERGON) 225 (27 FE) MG tablet Take 1 tablet (240 mg total) by mouth 2 (two) times daily with a meal. 02/03/15   Margarito LinerJerry Joines, MD  ferrous sulfate 325 (65 FE) MG tablet TAKE 1 TABLET BY MOUTH TWICE A DAY WITH A MEAL 10/09/15   Tasrif Ahmed, MD  losartan (COZAAR) 50 MG tablet TAKE 0.5 TABLETS (25 MG TOTAL) BY MOUTH DAILY. 05/02/15   Tasrif Ahmed, MD  memantine (NAMENDA) 10 MG tablet Take 1 tablet (10 mg total) by mouth daily. 04/05/15   Earl LagosNischal Narendra, MD  rivastigmine (EXELON) 4.6 mg/24hr Place 1 patch (4.6 mg total) onto the skin daily. Please replace patch at same time 10 AM daily 05/04/15   Courtney ParisEden W Jones, MD   BP 124/70 mmHg  Pulse 59  Temp(Src) 97.6 F (36.4 C) (Oral)  Resp 21  SpO2 100% Physical Exam  Constitutional: She appears well-developed and well-nourished.  HENT:  Head: Normocephalic and atraumatic.  Eyes: Conjunctivae are normal. Pupils are equal, round, and reactive to light.  Neck: Neck supple. No tracheal deviation present. No thyromegaly present.  Cardiovascular: Regular rhythm.   No murmur heard. Mildly bradycardic  Pulmonary/Chest: Effort normal and breath sounds normal.  Abdominal: Soft. Bowel sounds are normal. She exhibits no distension. There is no tenderness.  Musculoskeletal: Normal range of motion. She exhibits edema. She exhibits no tenderness.  1+ nonpitting lower extremity edema bilaterally  Neurological: She is alert. Coordination normal.  Follow simple commands moves all extremities  Skin: Skin is warm and dry. No rash noted.  Psychiatric: She has a normal mood and affect.  Nursing note and vitals reviewed.   ED Course  Procedures (including critical care time) Labs Review Labs  Reviewed  BASIC METABOLIC PANEL  CBC  BRAIN NATRIURETIC PEPTIDE    Imaging Review Dg Chest 2 View  10/22/2015  CLINICAL DATA:  Left-sided chest pain of unknown duration. History of hypertension and hiatal hernia. Nonsmoker. Initial encounter. EXAM: CHEST  2 VIEW COMPARISON:  07/05/2015 chest radiographs.  Abdominal CT 06/16/2011. FINDINGS: The heart size is stable with aortic tortuosity. There is a large hiatal hernia which is larger than on the prior radiographs, but similar to the prior CT. Chronic left lower lobe atelectasis and blunting of the left costophrenic angle are unchanged. The right lung is clear. No acute osseous findings are demonstrated. There are chronic degenerative changes at both shoulders with flattening of the left humeral head and evidence of bilateral chronic rotator cuff tears. IMPRESSION: No acute findings demonstrated. Large hiatal hernia with chronic degenerative changes of  the shoulders. Electronically Signed   By: Carey Bullocks M.D.   On: 10/22/2015 18:35   I have personally reviewed and evaluated these images and lab results as part of my medical decision-making.   EKG Interpretation   Date/Time:  Sunday October 22 2015 18:06:31 EST Ventricular Rate:  54 PR Interval:  193 QRS Duration: 91 QT Interval:  466 QTC Calculation: 442 R Axis:   -30 Text Interpretation:  Sinus rhythm Probable left atrial enlargement Left  axis deviation Consider anterior infarct No significant change since last  tracing Confirmed by Ethelda Chick  MD, Wilmina Maxham [phone]) on 10/22/2015 7:22:43 PM     Chest x-ray viewed by me  11:10 PM patient remains asymptomatic. Resting comfortably Results for orders placed or performed during the hospital encounter of 10/22/15  Basic metabolic panel  Result Value Ref Range   Sodium 133 (L) 135 - 145 mmol/L   Potassium 4.4 3.5 - 5.1 mmol/L   Chloride 96 (L) 101 - 111 mmol/L   CO2 28 22 - 32 mmol/L   Glucose, Bld 109 (H) 65 - 99 mg/dL   BUN 55 (H)  6 - 20 mg/dL   Creatinine, Ser 1.91 (H) 0.44 - 1.00 mg/dL   Calcium 9.1 8.9 - 47.8 mg/dL   GFR calc non Af Amer 18 (L) >60 mL/min   GFR calc Af Amer 20 (L) >60 mL/min   Anion gap 9 5 - 15  CBC  Result Value Ref Range   WBC 6.3 4.0 - 10.5 K/uL   RBC 3.64 (L) 3.87 - 5.11 MIL/uL   Hemoglobin 11.0 (L) 12.0 - 15.0 g/dL   HCT 29.5 (L) 62.1 - 30.8 %   MCV 89.3 78.0 - 100.0 fL   MCH 30.2 26.0 - 34.0 pg   MCHC 33.8 30.0 - 36.0 g/dL   RDW 65.7 84.6 - 96.2 %   Platelets 190 150 - 400 K/uL  I-stat troponin, ED  Result Value Ref Range   Troponin i, poc 0.01 0.00 - 0.08 ng/mL   Comment 3          I-stat troponin, ED  Result Value Ref Range   Troponin i, poc 0.02 0.00 - 0.08 ng/mL   Comment 3           Dg Chest 2 View  10/22/2015  CLINICAL DATA:  Left-sided chest pain of unknown duration. History of hypertension and hiatal hernia. Nonsmoker. Initial encounter. EXAM: CHEST  2 VIEW COMPARISON:  07/05/2015 chest radiographs.  Abdominal CT 06/16/2011. FINDINGS: The heart size is stable with aortic tortuosity. There is a large hiatal hernia which is larger than on the prior radiographs, but similar to the prior CT. Chronic left lower lobe atelectasis and blunting of the left costophrenic angle are unchanged. The right lung is clear. No acute osseous findings are demonstrated. There are chronic degenerative changes at both shoulders with flattening of the left humeral head and evidence of bilateral chronic rotator cuff tears. IMPRESSION: No acute findings demonstrated. Large hiatal hernia with chronic degenerative changes of the shoulders. Electronically Signed   By: Carey Bullocks M.D.   On: 10/22/2015 18:35    MDM  Patient's safe to return to nursing home. 2 negative troponins. Nonacute EKG. Final diagnoses:  None   diagnosis #1chest pain #2 anemia #3renal insufficiency      Doug Sou, MD 10/22/15 2316

## 2015-10-22 NOTE — Discharge Instructions (Signed)
Nonspecific Chest Pain  Have [name] return if concern for any reason or call her primary care physician. Chest pain can be caused by many different conditions. There is always a chance that your pain could be related to something serious, such as a heart attack or a blood clot in your lungs. Chest pain can also be caused by conditions that are not life-threatening. If you have chest pain, it is very important to follow up with your health care provider. CAUSES  Chest pain can be caused by:  Heartburn.  Pneumonia or bronchitis.  Anxiety or stress.  Inflammation around your heart (pericarditis) or lung (pleuritis or pleurisy).  A blood clot in your lung.  A collapsed lung (pneumothorax). It can develop suddenly on its own (spontaneous pneumothorax) or from trauma to the chest.  Shingles infection (varicella-zoster virus).  Heart attack.  Damage to the bones, muscles, and cartilage that make up your chest wall. This can include:  Bruised bones due to injury.  Strained muscles or cartilage due to frequent or repeated coughing or overwork.  Fracture to one or more ribs.  Sore cartilage due to inflammation (costochondritis). RISK FACTORS  Risk factors for chest pain may include:  Activities that increase your risk for trauma or injury to your chest.  Respiratory infections or conditions that cause frequent coughing.  Medical conditions or overeating that can cause heartburn.  Heart disease or family history of heart disease.  Conditions or health behaviors that increase your risk of developing a blood clot.  Having had chicken pox (varicella zoster). SIGNS AND SYMPTOMS Chest pain can feel like:  Burning or tingling on the surface of your chest or deep in your chest.  Crushing, pressure, aching, or squeezing pain.  Dull or sharp pain that is worse when you move, cough, or take a deep breath.  Pain that is also felt in your back, neck, shoulder, or arm, or pain that  spreads to any of these areas. Your chest pain may come and go, or it may stay constant. DIAGNOSIS Lab tests or other studies may be needed to find the cause of your pain. Your health care provider may have you take a test called an ambulatory ECG (electrocardiogram). An ECG records your heartbeat patterns at the time the test is performed. You may also have other tests, such as:  Transthoracic echocardiogram (TTE). During echocardiography, sound waves are used to create a picture of all of the heart structures and to look at how blood flows through your heart.  Transesophageal echocardiogram (TEE).This is a more advanced imaging test that obtains images from inside your body. It allows your health care provider to see your heart in finer detail.  Cardiac monitoring. This allows your health care provider to monitor your heart rate and rhythm in real time.  Holter monitor. This is a portable device that records your heartbeat and can help to diagnose abnormal heartbeats. It allows your health care provider to track your heart activity for several days, if needed.  Stress tests. These can be done through exercise or by taking medicine that makes your heart beat more quickly.  Blood tests.  Imaging tests. TREATMENT  Your treatment depends on what is causing your chest pain. Treatment may include:  Medicines. These may include:  Acid blockers for heartburn.  Anti-inflammatory medicine.  Pain medicine for inflammatory conditions.  Antibiotic medicine, if an infection is present.  Medicines to dissolve blood clots.  Medicines to treat coronary artery disease.  Supportive care  for conditions that do not require medicines. This may include:  Resting.  Applying heat or cold packs to injured areas.  Limiting activities until pain decreases. HOME CARE INSTRUCTIONS  If you were prescribed an antibiotic medicine, finish it all even if you start to feel better.  Avoid any activities  that bring on chest pain.  Do not use any tobacco products, including cigarettes, chewing tobacco, or electronic cigarettes. If you need help quitting, ask your health care provider.  Do not drink alcohol.  Take medicines only as directed by your health care provider.  Keep all follow-up visits as directed by your health care provider. This is important. This includes any further testing if your chest pain does not go away.  If heartburn is the cause for your chest pain, you may be told to keep your head raised (elevated) while sleeping. This reduces the chance that acid will go from your stomach into your esophagus.  Make lifestyle changes as directed by your health care provider. These may include:  Getting regular exercise. Ask your health care provider to suggest some activities that are safe for you.  Eating a heart-healthy diet. A registered dietitian can help you to learn healthy eating options.  Maintaining a healthy weight.  Managing diabetes, if necessary.  Reducing stress. SEEK MEDICAL CARE IF:  Your chest pain does not go away after treatment.  You have a rash with blisters on your chest.  You have a fever. SEEK IMMEDIATE MEDICAL CARE IF:   Your chest pain is worse.  You have an increasing cough, or you cough up blood.  You have severe abdominal pain.  You have severe weakness.  You faint.  You have chills.  You have sudden, unexplained chest discomfort.  You have sudden, unexplained discomfort in your arms, back, neck, or jaw.  You have shortness of breath at any time.  You suddenly start to sweat, or your skin gets clammy.  You feel nauseous or you vomit.  You suddenly feel light-headed or dizzy.  Your heart begins to beat quickly, or it feels like it is skipping beats. These symptoms may represent a serious problem that is an emergency. Do not wait to see if the symptoms will go away. Get medical help right away. Call your local emergency  services (911 in the [place] Do not drive yourself to the hospital.   This information is not intended to replace advice given to you by your health care provider. Make sure you discuss any questions you have with your health care provider.   Document Released: 08/28/2005 Document Revised: 12/09/2014 Document Reviewed: 06/24/2014 Elsevier Interactive Patient Education [employer]

## 2015-10-27 ENCOUNTER — Other Ambulatory Visit: Payer: Self-pay | Admitting: Internal Medicine

## 2015-10-30 ENCOUNTER — Other Ambulatory Visit: Payer: Self-pay | Admitting: Internal Medicine

## 2015-10-30 ENCOUNTER — Encounter: Payer: Self-pay | Admitting: Internal Medicine

## 2015-10-30 ENCOUNTER — Ambulatory Visit (INDEPENDENT_AMBULATORY_CARE_PROVIDER_SITE_OTHER): Payer: Medicare Other | Admitting: Internal Medicine

## 2015-10-30 VITALS — BP 123/67 | HR 56 | Temp 97.4°F | Wt 123.1 lb

## 2015-10-30 DIAGNOSIS — M1000 Idiopathic gout, unspecified site: Secondary | ICD-10-CM

## 2015-10-30 DIAGNOSIS — F0280 Dementia in other diseases classified elsewhere without behavioral disturbance: Secondary | ICD-10-CM | POA: Diagnosis not present

## 2015-10-30 DIAGNOSIS — G309 Alzheimer's disease, unspecified: Secondary | ICD-10-CM | POA: Diagnosis not present

## 2015-10-30 DIAGNOSIS — I129 Hypertensive chronic kidney disease with stage 1 through stage 4 chronic kidney disease, or unspecified chronic kidney disease: Secondary | ICD-10-CM | POA: Diagnosis present

## 2015-10-30 DIAGNOSIS — Z23 Encounter for immunization: Secondary | ICD-10-CM

## 2015-10-30 DIAGNOSIS — N183 Chronic kidney disease, stage 3 (moderate): Secondary | ICD-10-CM | POA: Diagnosis not present

## 2015-10-30 DIAGNOSIS — I10 Essential (primary) hypertension: Secondary | ICD-10-CM

## 2015-10-30 NOTE — Progress Notes (Signed)
   Subjective:    Patient ID: Meghan CuretMary M Vannote, female    DOB: 08/13/1926, 81 y.o.   MRN: [id number]  HPI  81 yo female with hx of HTN, CKD, gout, iron def anemia, Alzeimer's dementia here   Was in the ED recently for chest pain, aCS ruled out and was sent back to nursing home.  Doing well. No complaints currenlty. Has baseline dementia. Stable.  Wants paper work for nursing home [facility](FL2) filled out. Doing well at her current [facility] nursing home. Enjoys there. Independently walks without any device, no falls, needs help with bathing. Son is very supportive.   Patient had AKI (Crt bumped to 2.3 from baseline 1.5). Has CKD III at baseline. Not sure how much water she is drinking but son states she is eating very well.  Review of Systems  Constitutional: Negative for fever and chills.  HENT: Negative for congestion and sore throat.   Eyes: Negative for pain, discharge and visual disturbance.  Respiratory: Negative for cough, chest tightness, shortness of breath and wheezing.   Cardiovascular: Negative for chest pain, palpitations and leg swelling.  Gastrointestinal: Negative for nausea, diarrhea, abdominal distention and anal bleeding.  Genitourinary: Negative for dysuria and difficulty urinating.  Musculoskeletal: Negative for back pain, joint swelling, arthralgias and neck pain.  Skin: Negative.  Negative for rash.  Allergic/Immunologic: Negative.   Neurological: Negative for dizziness, weakness, numbness and headaches.  Hematological: Negative.   Psychiatric/Behavioral: Negative.        Objective:   Physical Exam  Constitutional: She appears well-developed and well-nourished. No distress.  Pleasant elderly female.   HENT:  Head: Normocephalic and atraumatic.  Right Ear: External ear normal.  Left Ear: External ear normal.  Mouth/Throat: Oropharynx is clear and moist.  Eyes: Conjunctivae and EOM are normal. Pupils are equal, round, and reactive to light. Right eye exhibits no  discharge. Left eye exhibits no discharge. No scleral icterus.  Neck: Normal range of motion. Neck supple. No JVD present.  Cardiovascular: Normal rate, regular rhythm and normal heart sounds.   Has systolic murmur.   Pulmonary/Chest: Effort normal and breath sounds normal. No respiratory distress. She has no wheezes. She has no rales. She exhibits no tenderness.  Abdominal: Soft. Bowel sounds are normal. She exhibits no distension and no mass. There is no tenderness. There is no rebound and no guarding.  Musculoskeletal: Normal range of motion.  1+ pitting edema on both legs. No erythema, tenderness, warmth.   Neurological:  Alert. Oriented to self only, not to date or place or president. This is baseline per son.   Skin: She is not diaphoretic.     Filed Vitals:   10/30/15 1506  BP: 123/67  Pulse: 56  Temp: 97.4 F (36.3 C)        Assessment & Plan:  See problem based a&p.

## 2015-10-30 NOTE — Patient Instructions (Signed)
You are doing well. You had some worsening of your kidney function. We will recheck it today. Hold you losartan for now until we let you know. If your kidney function is back to normal then you can resume it.   Follow up in 3 months.

## 2015-10-31 ENCOUNTER — Encounter: Payer: Self-pay | Admitting: Licensed Clinical Social Worker

## 2015-10-31 LAB — BASIC METABOLIC PANEL
BUN/Creatinine Ratio: 30 — ABNORMAL HIGH (ref 11–26)
BUN: 47 mg/dL — AB (ref 8–27)
CALCIUM: 9.9 mg/dL (ref 8.7–10.3)
CHLORIDE: 97 mmol/L (ref 97–106)
CO2: 26 mmol/L (ref 18–29)
Creatinine, Ser: 1.58 mg/dL — ABNORMAL HIGH (ref 0.57–1.00)
GFR calc non Af Amer: 29 mL/min/{1.73_m2} — ABNORMAL LOW (ref >59)
GFR, EST AFRICAN AMERICAN: 34 mL/min/{1.73_m2} — AB (ref >59)
GLUCOSE: 118 mg/dL — AB (ref 65–99)
Potassium: 4.8 mmol/L (ref 3.5–5.2)
Sodium: 141 mmol/L (ref 136–144)

## 2015-10-31 LAB — URIC ACID: URIC ACID: 3.7 mg/dL (ref 2.5–7.1)

## 2015-10-31 NOTE — Progress Notes (Signed)
Patient ID: Meghan CuretMary M Duncan, female   DOB: 11/13/1926, 81 y.o.   MRN: [id number] CSW placed call to [facility] @ [phone] to obtain fax number.  CSW faxed signed FL-2, pharmacy communication, medication list, and AVS to [facility] @ [phone]. Originals to be scanned in EMR.

## 2015-10-31 NOTE — Progress Notes (Signed)
Patient ID: Meghan CuretMary M Manon, female   DOB: 01/16/1926, 81 y.o.   MRN: [id number] CSW faxed lab result with note from PCP to resume losartan to [facility] [phone]

## 2015-11-01 NOTE — Assessment & Plan Note (Signed)
Lab Results  Component Value Date   CREATININE 1.58* 10/30/2015   CREATININE 2.34* 10/22/2015   CREATININE 1.57* 07/05/2015  she recently had crt bump in the ED last visit. Today I rechecked it and it has normalized. Perhaps she was dehydrated before last visit. I put in her FL2 for the nursing home that she needs to drink plenty of water (recommended ~1500cc daily).

## 2015-11-01 NOTE — Assessment & Plan Note (Signed)
Filed Vitals:   10/30/15 1506  BP: 123/67  Pulse: 56  Temp: 97.4 F (36.3 C)   BP well controlled on current regimen. Initially I wanted to hold her losartan as her Crt increased during last visit to the ED, however, on recheck this visit, crt has came down.  Will continue Dilt 420mg  24 hr + losartan 25mg  daily.

## 2015-11-01 NOTE — Assessment & Plan Note (Signed)
Has not had a gout attack for many years per son. Checked uric acid today. It was 3.7.  Continue her allopurinol 200mg  daily. Continue Colchicine 0.3mg  daily PRN gout attacks.

## 2015-11-01 NOTE — Assessment & Plan Note (Signed)
Patient's dementia is stable per son. She remains oriented to herself only. No changes since last visit. Able to walk without assistance, no falls. Gets help with bathing at the nursing home. Has been eating well per son. No sign of abuse noted.   Cont exelon 4.6 mg /24hr patch and namenda 10mg  daily

## 2015-11-07 NOTE — Progress Notes (Signed)
Internal Medicine Clinic Attending  Case discussed with Dr. Ahmed at the time of the visit.  We reviewed the resident's history and exam and pertinent patient test results.  I agree with the assessment, diagnosis, and plan of care documented in the resident's note. 

## 2015-11-16 ENCOUNTER — Other Ambulatory Visit: Payer: Self-pay | Admitting: Internal Medicine

## 2015-11-16 DIAGNOSIS — F0280 Dementia in other diseases classified elsewhere without behavioral disturbance: Secondary | ICD-10-CM

## 2015-11-16 DIAGNOSIS — G309 Alzheimer's disease, unspecified: Principal | ICD-10-CM

## 2016-01-09 ENCOUNTER — Other Ambulatory Visit: Payer: Self-pay | Admitting: Internal Medicine

## 2016-01-09 NOTE — Telephone Encounter (Signed)
Tried to call pt to make appt, her phone said she was not accepting calls at the moment

## 2016-02-03 ENCOUNTER — Other Ambulatory Visit: Payer: Self-pay | Admitting: Internal Medicine

## 2016-02-08 ENCOUNTER — Other Ambulatory Visit: Payer: Self-pay | Admitting: Internal Medicine

## 2016-02-08 NOTE — Telephone Encounter (Signed)
Pt needs f/u appt

## 2016-02-11 ENCOUNTER — Other Ambulatory Visit: Payer: Self-pay | Admitting: Internal Medicine

## 2016-02-12 ENCOUNTER — Encounter: Payer: Self-pay | Admitting: Internal Medicine

## 2016-02-12 NOTE — Telephone Encounter (Signed)
Attempted to contact patient this am about making an appt, but her telephone number was not accepting calls at this time.  I am going to send her a mychart and a letter asking to pls get in contact with clinic asap.

## 2016-03-13 ENCOUNTER — Telehealth: Payer: Self-pay | Admitting: Internal Medicine

## 2016-03-13 NOTE — Telephone Encounter (Signed)
APPT. REMINDER CALL, LMTCB °

## 2016-03-18 ENCOUNTER — Encounter: Payer: Medicare Other | Admitting: Internal Medicine

## 2016-03-29 ENCOUNTER — Telehealth: Payer: Self-pay | Admitting: Internal Medicine

## 2016-03-29 NOTE — Telephone Encounter (Signed)
APPT. REMINDER CALL, NO ANSWER, NO VOICE MAIL °

## 2016-03-30 ENCOUNTER — Other Ambulatory Visit: Payer: Self-pay | Admitting: Internal Medicine

## 2016-04-01 ENCOUNTER — Ambulatory Visit (INDEPENDENT_AMBULATORY_CARE_PROVIDER_SITE_OTHER): Payer: Medicare Other | Admitting: Internal Medicine

## 2016-04-01 ENCOUNTER — Encounter: Payer: Self-pay | Admitting: Internal Medicine

## 2016-04-01 VITALS — BP 113/57 | HR 60 | Temp 98.1°F | Wt 135.4 lb

## 2016-04-01 DIAGNOSIS — G309 Alzheimer's disease, unspecified: Secondary | ICD-10-CM

## 2016-04-01 DIAGNOSIS — Z23 Encounter for immunization: Secondary | ICD-10-CM

## 2016-04-01 DIAGNOSIS — I10 Essential (primary) hypertension: Secondary | ICD-10-CM

## 2016-04-01 DIAGNOSIS — M1000 Idiopathic gout, unspecified site: Secondary | ICD-10-CM

## 2016-04-01 DIAGNOSIS — M1A9XX0 Chronic gout, unspecified, without tophus (tophi): Secondary | ICD-10-CM

## 2016-04-01 DIAGNOSIS — F0280 Dementia in other diseases classified elsewhere without behavioral disturbance: Secondary | ICD-10-CM | POA: Diagnosis not present

## 2016-04-01 MED ORDER — RIVASTIGMINE 4.6 MG/24HR TD PT24: 4.6000 mg | MEDICATED_PATCH | Freq: Every day | TRANSDERMAL | Status: DC

## 2016-04-01 MED ORDER — ALLOPURINOL 100 MG PO TABS: 200.0000 mg | ORAL_TABLET | Freq: Every day | ORAL | Status: DC

## 2016-04-01 MED ORDER — MEMANTINE HCL 10 MG PO TABS: 10.0000 mg | ORAL_TABLET | Freq: Every day | ORAL | Status: DC

## 2016-04-01 NOTE — Assessment & Plan Note (Signed)
Dementia is stable per son. She is very pleasant. Does activities at [facility] such as exercise and other social things. Enjoys being there.   Cont exelon 4.6 mg /24 patch and namenda 10mg  daily.

## 2016-04-01 NOTE — Assessment & Plan Note (Signed)
Filed Vitals:   04/01/16 1437  BP: 113/57  Pulse: 60  Temp: 98.1 F (36.7 C)   BP remains well controlled. Cont Dilt 420mg  q24hr + losartan 25mg  daily.

## 2016-04-01 NOTE — Patient Instructions (Addendum)
You are doing great.  Keep taking your medication except Colchichine. Don't take this anymore.   Follow up in 6 months.

## 2016-04-01 NOTE — Progress Notes (Signed)
   Subjective:    Patient ID: [name], female    DOB: 08/12/1926, 82 y.o.   MRN: [id number]  HPI  82 yo female with hx of HTN, CKD, gout, iron def anemia, Alzeimer's dementia here for follow up of chronic problems such as HTN and dementia.    HTN: well controlled on current meds. Takes meds as instructed.   Dementia: stable per son. Able to walk and participate in [facility] social activities. Gets help with IADLs and some ADLs.   Gout: doing well. No recent flares. No tophi.   Health maintnence: received PCV-23 on 2011 (65+). Due for PCV-13.   Review of Systems  Constitutional: Negative for fever and chills.  HENT: Negative for congestion and sore throat.   Eyes: Negative for pain, discharge and visual disturbance.  Respiratory: Negative for cough, chest tightness, shortness of breath and wheezing.   Cardiovascular: Negative for chest pain, palpitations and leg swelling.  Gastrointestinal: Negative for nausea, diarrhea, abdominal distention and anal bleeding.  Genitourinary: Negative for dysuria and difficulty urinating.  Musculoskeletal: Negative for back pain, joint swelling, arthralgias and neck pain.  Skin: Negative.  Negative for rash.  Allergic/Immunologic: Negative.   Neurological: Negative for dizziness, weakness, numbness and headaches.  Hematological: Negative.   Psychiatric/Behavioral: Negative.        Objective:   Physical Exam  Constitutional: She appears well-developed and well-nourished. No distress.  Pleasant elderly female.   HENT:  Head: Normocephalic and atraumatic.  Right Ear: External ear normal.  Left Ear: External ear normal.  Mouth/Throat: Oropharynx is clear and moist.  Eyes: Conjunctivae and EOM are normal. Pupils are equal, round, and reactive to light. Right eye exhibits no discharge. Left eye exhibits no discharge. No scleral icterus.  Neck: Normal range of motion. Neck supple. No JVD present.  Cardiovascular: Normal rate, regular  rhythm and normal heart sounds.   Has systolic murmur.   Pulmonary/Chest: Effort normal and breath sounds normal. No respiratory distress. She has no wheezes. She has no rales. She exhibits no tenderness.  Abdominal: Soft. Bowel sounds are normal. She exhibits no distension and no mass. There is no tenderness. There is no rebound and no guarding.  Musculoskeletal: Normal range of motion.  1+ pitting edema on both legs. No erythema, tenderness, warmth.   Neurological:  Alert. Oriented to self only, not to date or place or president. This is baseline per son.   Skin: She is not diaphoretic.     Filed Vitals:   04/01/16 1437  BP: 113/57  Pulse: 60  Temp: 98.1 F (36.7 C)         Assessment & Plan:  See problem based a&p.

## 2016-04-01 NOTE — Telephone Encounter (Signed)
Patient has not been seen since 10/2015, but has an appointment 04/01/2016.

## 2016-04-01 NOTE — Assessment & Plan Note (Addendum)
Doing well. No flares recently. Compliant with allopurinol 200mg  daily -cont allopurinol. No need to check UA as it was normal 5 months ago.  -d/ced colchicine as she should not need this anymore as her UA has been under control.

## 2016-04-02 NOTE — Progress Notes (Signed)
Case discussed with Dr. Ahmed at the time of the visit. We reviewed the resident's history and exam and pertinent patient test results. I agree with the assessment, diagnosis, and plan of care documented in the resident's note. 

## 2016-04-04 ENCOUNTER — Other Ambulatory Visit: Payer: Self-pay | Admitting: Internal Medicine

## 2016-05-20 ENCOUNTER — Other Ambulatory Visit: Payer: Self-pay | Admitting: Internal Medicine

## 2016-05-22 ENCOUNTER — Other Ambulatory Visit: Payer: Self-pay | Admitting: Internal Medicine

## 2016-06-07 ENCOUNTER — Ambulatory Visit (INDEPENDENT_AMBULATORY_CARE_PROVIDER_SITE_OTHER): Payer: Medicare Other | Admitting: Internal Medicine

## 2016-06-07 ENCOUNTER — Encounter: Payer: Self-pay | Admitting: Internal Medicine

## 2016-06-07 VITALS — BP 146/70 | HR 62 | Temp 97.7°F | Ht <= 58 in | Wt 138.9 lb

## 2016-06-07 DIAGNOSIS — R600 Localized edema: Secondary | ICD-10-CM | POA: Diagnosis not present

## 2016-06-07 DIAGNOSIS — N189 Chronic kidney disease, unspecified: Secondary | ICD-10-CM

## 2016-06-07 LAB — COMPREHENSIVE METABOLIC PANEL
ALBUMIN: 4.2 g/dL (ref 3.5–5.0)
ALT: 15 U/L (ref 14–54)
AST: 22 U/L (ref 15–41)
Alkaline Phosphatase: 89 U/L (ref 38–126)
Anion gap: 9 (ref 5–15)
BUN: 38 mg/dL — AB (ref 6–20)
CHLORIDE: 100 mmol/L — AB (ref 101–111)
CO2: 30 mmol/L (ref 22–32)
CREATININE: 1.42 mg/dL — AB (ref 0.44–1.00)
Calcium: 9.9 mg/dL (ref 8.9–10.3)
GFR calc Af Amer: 37 mL/min — ABNORMAL LOW (ref >60)
GFR calc non Af Amer: 32 mL/min — ABNORMAL LOW (ref >60)
GLUCOSE: 100 mg/dL — AB (ref 65–99)
POTASSIUM: 3.8 mmol/L (ref 3.5–5.1)
SODIUM: 139 mmol/L (ref 135–145)
Total Bilirubin: 0.4 mg/dL (ref 0.3–1.2)
Total Protein: 7.8 g/dL (ref 6.5–8.1)

## 2016-06-07 NOTE — Progress Notes (Signed)
CC: Leg Swelling HPI: [name] is a 82 y.o. female with a h/o of HTN, CKD, Gout, and dementia who presents with increased swelling in her legs. The swelling has been progressively worsening for the past several weeks. She has gained about 8-9 pounds in the last few months most of which she attributes to fluid on her legs. She denies any pain, numbness or paraesthesias in her legs. She also denies any SOB, orthopnea, or dyspnea on exertion. She has no complaints related to the swelling or otherwise at today's visit. She denies headaches, chest pain, n/v and abdominal pain or changes in her bowel or bladder habits.     Past Medical History  Diagnosis Date  . Hypertension   . Renal insufficiency   . Iron deficiency anemia   . Gout   . Osteopenia     DXA bone density scan on 10/16/2009 showed osteopenia with an AP spine T-score of -1.5, left femoral neck T-score of -1.7, and right femoral neck T-score of -2.0.  Her FRAX score gave an estimated ten-year probability of major osteoporotic fracture of 7.3%, and a ten-year probability of hip fracture of 2.1%.  . Vitamin D deficiency     Vitamin D (25-Hydroxy) <10 ng/mL on 12/07/2009.  . Diverticulosis of colon   . Malignant neoplasm of colon 1991    S/P right hemicolectomy in 1991 by Dr. Jamey RipaStreck, reportedly done for a malignant polyp; colonoscopy by Dr. Lina Sarora Brodie 02/09/2008 showed  marked left colon diverticulosis with partial narrowing, S/P  remote right hemicolectomy.  . Adenomatous colon polyp   . Lower GI bleed   . Gastric ulcer 1991    H. pylori positive in 1991; EGD 01/31/00 by Dr. Juanda ChanceBrodie showed mild benign distal esophageal stricture (dilator passed), gastritis with evidence of coffee-ground material, and a 4 cm non-reducible hiatal hernia; EGD  02/09/2008 showed hiatal hernia, and mild benign distal espohageal stricture, was otherwise normal.  . Elevated alkaline phosphatase level     Chronic; GGT was normal (30) on 09/18/2004.  [name].  Constipation   . Tinnitus, bilateral     Audiological evaluation on February 05, 2007 showed a slight to mild loss of hearing in both ears through 4000 Hz, with a moderate to severe precipitous drop in the highest frequencies (6000 to 8000 Hz); her speech recognition abilities were within normal limits and a slightly elevated conversational level.  . Sensorineural hearing loss of both ears     Audiological evaluation on February 05, 2007 showed a slight to mild loss in both ears through 4000 Hz, with a moderate to severe precipitous drop in the highest frequencies (6000 to 8000 Hz).  . Endometrial polyp     S/P operative hysteroscopy with removal of polyps, dilatation and curettage on 10/20/2000 by Dr. Antionette CharLisa Jackson-Moore.  [name]. Unsteady gait 08/07/2011  . Hiatal hernia   . Esophageal stricture   . Cholelithiasis 01/13/2015   Current Outpatient Rx  Name  Route  Sig  Dispense  Refill  . allopurinol (ZYLOPRIM) 100 MG tablet   Oral   Take 2 tablets (200 mg total) by mouth daily.   180 tablet   2   . Calcium Carbonate-Vitamin D 600-400 MG-UNIT per tablet   Oral   Take 1 tablet by mouth 2 (two) times daily.   60 tablet   3   . diltiazem (TIAZAC) 420 MG 24 hr capsule      TAKE 1 CAPSULE (420 MG TOTAL) BY MOUTH DAILY.  90 capsule   1   . ferrous gluconate (FERGON) 225 (27 FE) MG tablet   Oral   Take 1 tablet (240 mg total) by mouth 2 (two) times daily with a meal.   60 each   3   . ferrous sulfate 325 (65 FE) MG tablet      TAKE 1 TABLET BY MOUTH TWICE A DAY WITH A MEAL   60 tablet   0   . losartan (COZAAR) 25 MG tablet   Oral   Take 1 tablet (25 mg total) by mouth daily.   90 tablet   0   . memantine (NAMENDA) 10 MG tablet   Oral   Take 1 tablet (10 mg total) by mouth daily.   90 tablet   3   . rivastigmine (EXELON) 4.6 mg/24hr   Transdermal   Place 1 patch (4.6 mg total) onto the skin daily.   90 patch   3     A complete ROS was negative except as per HPI.  Physical  Exam: Filed Vitals:   06/07/16 1542  BP: 146/70  Pulse: 62  Temp: 97.7 F (36.5 C)  TempSrc: Oral  Height: 4\' 9"  (1.448 m)  Weight: 138 lb 14.4 oz (63.005 kg)  SpO2: 100%   General appearance: alert, cooperative and no distress Head: Normocephalic, without obvious abnormality, atraumatic Lungs: rales bibasilar Heart: regular rate and rhythm, S3 present and systolic murmur: early systolic 4/6, vibratory at 2nd right intercostal space Abdomen: soft, non-tender; bowel sounds normal; no masses,  no organomegaly Extremities: edema 2+ pitting edema in her lower extremities bilaterally  Assessment & Plan:  See encounters tab for problem based medical decision making. Patient seen with Dr. Cleda DaubE. Hoffman  Signed: Thomasene LotJames Truth Barot, MD 06/07/2016, 4:58 PM  Pager: [phone]

## 2016-06-07 NOTE — Patient Instructions (Signed)
It was a pleasure seeing you today. Thank you for choosing [facility] for your healthcare needs. -- Today we will do some blood work and will notify you of the results -- we will schedule an echocardiogram for next week to take a look at your heart -- Symptoms to lookout for include, Shortness of breath, trouble sleeping flat, needing pillows to sleep, increased swelling or weight gain, and decreased urinary output. For any troubles breathing please return to clinic or the emergency department    Edema Edema is an abnormal buildup of fluids in your bodytissues. Edema is somewhatdependent on gravity to pull the fluid to the lowest place in your body. That makes the condition more common in the legs and thighs (lower extremities). Painless swelling of the feet and ankles is common and becomes more likely as you get older. It is also common in looser tissues, like around your eyes.  When the affected area is squeezed, the fluid may move out of that spot and leave a dent for a few moments. This dent is called pitting.  CAUSES  There are many possible causes of edema. Eating too much salt and being on your feet or sitting for a long time can cause edema in your legs and ankles. Hot weather may make edema worse. Common medical causes of edema include:  Heart failure.  Liver disease.  Kidney disease.  Weak blood vessels in your legs.  Cancer.  An injury.  Pregnancy.  Some medications.  Obesity. SYMPTOMS  Edema is usually painless.Your skin may look swollen or shiny.  DIAGNOSIS  Your health care provider may be able to diagnose edema by asking about your medical history and doing a physical exam. You may need to have tests such as X-rays, an electrocardiogram, or blood tests to check for medical conditions that may cause edema.  TREATMENT  Edema treatment depends on the cause. If you have heart, liver, or kidney disease, you need the treatment appropriate for these conditions. General  treatment may include:  Elevation of the affected body part above the level of your heart.  Compression of the affected body part. Pressure from elastic bandages or support stockings squeezes the tissues and forces fluid back into the blood vessels. This keeps fluid from entering the tissues.  Restriction of fluid and salt intake.  Use of a water pill (diuretic). These medications are appropriate only for some types of edema. They pull fluid out of your body and make you urinate more often. This gets rid of fluid and reduces swelling, but diuretics can have side effects. Only use diuretics as directed by your health care provider. HOME CARE INSTRUCTIONS   Keep the affected body part above the level of your heart when you are lying down.   Do not sit still or stand for prolonged periods.   Do not put anything directly under your knees when lying down.  Do not wear constricting clothing or garters on your upper legs.   Exercise your legs to work the fluid back into your blood vessels. This may help the swelling go down.   Wear elastic bandages or support stockings to reduce ankle swelling as directed by your health care provider.   Eat a low-salt diet to reduce fluid if your health care provider recommends it.   Only take medicines as directed by your health care provider. SEEK MEDICAL CARE IF:   Your edema is not responding to treatment.  You have heart, liver, or kidney disease and notice  symptoms of edema.  You have edema in your legs that does not improve after elevating them.   You have sudden and unexplained weight gain. SEEK IMMEDIATE MEDICAL CARE IF:   You develop shortness of breath or chest pain.   You cannot breathe when you lie down.  You develop pain, redness, or warmth in the swollen areas.   You have heart, liver, or kidney disease and suddenly get edema.  You have a fever and your symptoms suddenly get worse. MAKE SURE YOU:   Understand these  instructions.  Will watch your condition.  Will get help right away if you are not doing well or get worse.   This information is not intended to replace advice given to you by your health care provider. Make sure you discuss any questions you have with your health care provider.   Document Released: 11/18/2005 Document Revised: 12/09/2014 Document Reviewed: 09/10/2013 Elsevier Interactive Patient Education [employer]2016 [employer]

## 2016-06-07 NOTE — Assessment & Plan Note (Addendum)
Patient presenting with bilateral 2+ pitting edema of her lower extremities and 8-9 pound weight gain over the last several months. She denies any shortness of breath dyspnea on exertion or orthopnea. She has no associated symptoms. She does have chronic kidney disease and this increase in fluid retention could be related to declining renal function. Additionally, her cardiac examination was abnormal and although she does not have symptoms of heart failure she could have isolated right sided heart failure. Currently the DDX includes both worsening renal function and chronic kidney disease as well as heart failure. -- CMP  -- Echo scheduled for next week -- Will consider starting 40mg  lasix pending results of CMP -- Will return to clinic in 2 weeks or sooner as needed  Addendum 06/10/2016 Labs reviewed and discussed with Dr. Mikey BussingHoffman will start lasix 40mg  daily and have patient follow-up early next week for lab work.

## 2016-06-10 MED ORDER — FUROSEMIDE 40 MG PO TABS: 40.0000 mg | ORAL_TABLET | Freq: Every day | ORAL | Status: DC

## 2016-06-10 NOTE — Addendum Note (Signed)
Addended by: Kathleen ArgueAYLOR, Chevy Sweigert M on: 06/10/2016 05:04 PM   Modules accepted: Orders

## 2016-06-10 NOTE — Progress Notes (Signed)
Internal Medicine Clinic Attending  I saw and evaluated the patient.  I personally confirmed the key portions of the history and exam documented by Dr. Ladona Ridgelaylor and I reviewed pertinent patient test results.  The assessment, diagnosis, and plan were formulated together and I agree with the documentation in the resident's note.  [name] does have 2+ bilateral lower extremity edema to her thighs, however her pulmonary exam is clear and she denies any orthopnea.  We are concerned for heart failure especially right side, but other differential includes worsening renal function (heavy proteiniuria, but less likely given normal U/A in the past) as well as hypoalbuminemia.  *I do not suspect bilateral DVT as cause given the slow onset and lack of history or risk factors. As such we are obtaining an echocardiogram to evaluate her heart function as well as a CMP. Her CMP has returned with normal albumin and stable to improved renal function.  We are still awaiting the echo but we will proceed with some gentile diuresis with 40mg  of lasix daily and have her back this week to reassess and repeat a BMP.

## 2016-06-11 ENCOUNTER — Telehealth: Payer: Self-pay | Admitting: Internal Medicine

## 2016-06-11 NOTE — Telephone Encounter (Signed)
Spoke w/ pt's caretaker, explained lasix and made lab appt

## 2016-06-11 NOTE — Telephone Encounter (Signed)
Need to talk to nurse

## 2016-06-17 ENCOUNTER — Ambulatory Visit (INDEPENDENT_AMBULATORY_CARE_PROVIDER_SITE_OTHER): Payer: Medicare Other | Admitting: Internal Medicine

## 2016-06-17 ENCOUNTER — Encounter: Payer: Self-pay | Admitting: Internal Medicine

## 2016-06-17 ENCOUNTER — Other Ambulatory Visit: Payer: Medicare Other

## 2016-06-17 VITALS — BP 143/70 | HR 59 | Temp 97.6°F | Wt 139.4 lb

## 2016-06-17 DIAGNOSIS — N189 Chronic kidney disease, unspecified: Secondary | ICD-10-CM | POA: Diagnosis not present

## 2016-06-17 DIAGNOSIS — R600 Localized edema: Secondary | ICD-10-CM

## 2016-06-17 NOTE — Patient Instructions (Addendum)
It was a pleasure seeing you today. Thank you for choosing [facility] for your healthcare needs.  -- Continue medications you are currently on -- We will call with your lab results and let you know if we need to make any changes in your medication, if we do so we will increase Lasix from 40mg  once daily to 80mg  once daily. If we make this change we will want her to return for BMET in 1 week following this change. She will not need a full appointment but will need labs done. -- Need Echocardiogram as ordered

## 2016-06-17 NOTE — Progress Notes (Signed)
Internal Medicine Clinic Attending  I saw and evaluated the patient.  I personally confirmed the key portions of the history and exam documented by Dr. Taylor and I reviewed pertinent patient test results.  The assessment, diagnosis, and plan were formulated together and I agree with the documentation in the resident's note.  

## 2016-06-17 NOTE — Assessment & Plan Note (Addendum)
Patient presents as follow-up for her bilateral lower extremity edema. She has 2+ pitting edema extending from her feet superiorly to her patella. She has no signs or symptoms of left-sided heart failure and denies orthopnea, or dyspnea on exertion or shortness of breath. She denies chest pain or palpitations. She also denies calf pain and on physical examination she has no tenderness to palpation over her calves bilaterally. Currently the differential diagnosis includes right-sided heart failure not secondary to left-sided heart failure as well as worsening of her chronic kidney disease. At this time we are not concerned for bilateral DVT as she has no history of shortness of breath, denies calf pain, and has no pain upon palpation of her calves on her physical examination. She was started on Lasix 40 mg once daily last week. This has not improved her lower extremity edema and she has not reached appropriate diuresis at this time point. Additionally,we ordered an echocardiogram which has not been completed. The results of this echocardiogram will be critical in determining the etiology of her bilateral lower extremity edema as it will assist in differentiating cardiac etiology such as right-sided heart failure from worsening of her chronic kidney disease. Since she has not reached appropriate diuresis we will consider increasing the dose of her Lasix from 40 mg once daily to  80 mg once daily. We will assess her renal function with a basic metabolic panel today and if her renal function is stable we will make this dose adjustment to her Lasix. -- BMET -- If Cr is stable we will double her Lasix from 40 mg once daily to 80 mg once daily -- If the dose of her Lasix is increased we will have her return to clinic in 1 week for repeat blood work including a basic metabolic panel -- We will arrange her appointment for an echocardiogram and will follow up with the results accordingly -- We will notify her son  Lyn Hollingsheadlexander if medication changes are made at phone number [phone] we will also notify her assisted living facility at phone number [phone]  [facility] Basic metabolic panel received from patient's recent blood work which didn't should've creatinine of 1.6. Since she has not reached appropriate diuresis and her renal function is stable we will increase her Lasix from 40 mg once daily to 80 mg once daily. We also think that by increasing diuresis we will improve her renal function and potentially decrease her creatinine. I will call and let her son as well as her assisted living facility know of the changes. -- Increase lasix to 80mg  once daily -- Return in 1 week for BMET

## 2016-06-17 NOTE — Progress Notes (Signed)
CC: Follow-up of bilateral lower extremity edema HPI: [name] is a 82 y.o. female with a h/o of hypertension, osteopenia, CKD, gout and dementia who presents with continued bilateral lower extremity edema.The patient has 2+ pitting edema extending superiorly from her feet to her patella. I saw the patient 1 week ago and started her on a small dose of Lasix 40 mg once daily.She returns to clinic today for repeat blood work and clinical examination. At her previous appointment there was concern for right sided heart failure and we ordered an echocardiogram which has not been done at this point. At her previous appointment there was no concern for left-sided heart failure given the lack of orthopnea, dyspnea on exertion or shortness of breath.In addition, there was no concern for bilateral DVT of the lower extremities to explain her swelling. She did not have calf tenderness on palpation and she had a negative Homans sign. Her increased lower extremity edema was thought to be secondary to right-sided heart failure or worsening of her chronic kidney disease. She comes to the office today for repeat basic metabolic panel and clinical evaluation. Her bilateral lower extremity edema has not improved or worsened. She started the Lasix 40 mg once daily 5 days ago. She denies increased urinary frequency since starting this medication. She continues to deny shortness of breath, orthopnea, chest pain or heart palpitations. She denies new headaches, changes in her vision, nausea, vomiting or abdominal pain. She also denies any fevers, chills, night sweats or weight loss. Since she has not appropriately diuresed on 40 mg once daily Lasix we will repeat a basic metabolic panel and if her renal function is stable we will double her Lasix to 80 mg once daily.  Her son Lyn Hollingsheadlexander is very involved and active in her healthcare.e does a great job making sure that she takes her appropriate medications and makes her  physician appointments. He would like to be updated on any medication changes and he can be reached at [phone]. He would also like us to call her living facility and let them know directly if any medication changes. The phone number of her facilities [phone].  She has no additional acute complaints or concerns at today's visit.    Past Medical History  Diagnosis Date  . Hypertension   . Renal insufficiency   . Iron deficiency anemia   . Gout   . Osteopenia     DXA bone density scan on 10/16/2009 showed osteopenia with an AP spine T-score of -1.5, left femoral neck T-score of -1.7, and right femoral neck T-score of -2.0.  Her FRAX score gave an estimated ten-year probability of major osteoporotic fracture of 7.3%, and a ten-year probability of hip fracture of 2.1%.  . Vitamin D deficiency     Vitamin D (25-Hydroxy) <10 ng/mL on 12/07/2009.  . Diverticulosis of colon   . Malignant neoplasm of colon 1991    S/P right hemicolectomy in 1991 by Dr. Jamey RipaStreck, reportedly done for a malignant polyp; colonoscopy by Dr. Lina Sarora Brodie 02/09/2008 showed  marked left colon diverticulosis with partial narrowing, S/P  remote right hemicolectomy.  . Adenomatous colon polyp   . Lower GI bleed   . Gastric ulcer 1991    H. pylori positive in 1991; EGD 01/31/00 by Dr. Juanda ChanceBrodie showed mild benign distal esophageal stricture (dilator passed), gastritis with evidence of coffee-ground material, and a 4 cm non-reducible hiatal hernia; EGD  02/09/2008 showed hiatal hernia, and mild benign distal espohageal stricture,  was otherwise normal.  . Elevated alkaline phosphatase level     Chronic; GGT was normal (30) on 09/18/2004.  [name] Constipation   . Tinnitus, bilateral     Audiological evaluation on February 05, 2007 showed a slight to mild loss of hearing in both ears through 4000 Hz, with a moderate to severe precipitous drop in the highest frequencies (6000 to 8000 Hz); her speech recognition abilities were within normal  limits and a slightly elevated conversational level.  . Sensorineural hearing loss of both ears     Audiological evaluation on February 05, 2007 showed a slight to mild loss in both ears through 4000 Hz, with a moderate to severe precipitous drop in the highest frequencies (6000 to 8000 Hz).  . Endometrial polyp     S/P operative hysteroscopy with removal of polyps, dilatation and curettage on 10/20/2000 by Dr. Antionette Char.  [name] Unsteady gait 08/07/2011  . Hiatal hernia   . Esophageal stricture   . Cholelithiasis 01/13/2015   Current Outpatient Rx  Name  Route  Sig  Dispense  Refill  . allopurinol (ZYLOPRIM) 100 MG tablet   Oral   Take 2 tablets (200 mg total) by mouth daily.   180 tablet   2   . Calcium Carbonate-Vitamin D 600-400 MG-UNIT per tablet   Oral   Take 1 tablet by mouth 2 (two) times daily.   60 tablet   3   . diltiazem (TIAZAC) 420 MG 24 hr capsule      TAKE 1 CAPSULE (420 MG TOTAL) BY MOUTH DAILY.   90 capsule   1   . ferrous gluconate (FERGON) 225 (27 FE) MG tablet   Oral   Take 1 tablet (240 mg total) by mouth 2 (two) times daily with a meal.   60 each   3   . ferrous sulfate 325 (65 FE) MG tablet      TAKE 1 TABLET BY MOUTH TWICE A DAY WITH A MEAL   60 tablet   0   . furosemide (LASIX) 40 MG tablet   Oral   Take 1 tablet (40 mg total) by mouth daily.   30 tablet   0   . losartan (COZAAR) 25 MG tablet   Oral   Take 1 tablet (25 mg total) by mouth daily.   90 tablet   0   . memantine (NAMENDA) 10 MG tablet   Oral   Take 1 tablet (10 mg total) by mouth daily.   90 tablet   3   . rivastigmine (EXELON) 4.6 mg/24hr   Transdermal   Place 1 patch (4.6 mg total) onto the skin daily.   90 patch   3      Review of Systems: A complete ROS was negative except as per HPI.  Physical Exam: There were no vitals filed for this visit. General appearance: alert, cooperative and extremely nice lady Lungs: clear to auscultation bilaterally and no  rales, wheezes or crackles Heart: regular rate and rhythm, S1, S2 normal, no murmur, click, rub or gallop Abdomen: soft, non-tender; bowel sounds normal; no masses,  no organomegaly and no bruits auscultated Extremities: bilateral 2+ pitting edema extending from her feet to her patella, no pain upon palpation of her bilateral calves  Assessment & Plan:  See encounters tab for problem based medical decision making. Patient seen with Dr. Cleda Daub  Signed: Thomasene Lot, MD 06/17/2016, 10:02 AM  Pager: [phone]

## 2016-06-18 ENCOUNTER — Telehealth: Payer: Self-pay | Admitting: Internal Medicine

## 2016-06-18 LAB — BMP8+ANION GAP
ANION GAP: 18 mmol/L (ref 10.0–18.0)
BUN/Creatinine Ratio: 25 (ref 12–28)
BUN: 41 mg/dL — ABNORMAL HIGH (ref 8–27)
CALCIUM: 9.5 mg/dL (ref 8.7–10.3)
CHLORIDE: 95 mmol/L — AB (ref 96–106)
CO2: 26 mmol/L (ref 18–29)
Creatinine, Ser: 1.62 mg/dL — ABNORMAL HIGH (ref 0.57–1.00)
GFR calc Af Amer: 32 mL/min/{1.73_m2} — ABNORMAL LOW (ref >59)
GFR calc non Af Amer: 28 mL/min/{1.73_m2} — ABNORMAL LOW (ref >59)
GLUCOSE: 87 mg/dL (ref 65–99)
POTASSIUM: 4.1 mmol/L (ref 3.5–5.2)
Sodium: 139 mmol/L (ref 134–144)

## 2016-06-18 MED ORDER — FUROSEMIDE 80 MG PO TABS: 80.0000 mg | ORAL_TABLET | Freq: Every day | ORAL | Status: DC

## 2016-06-18 MED ORDER — FUROSEMIDE 40 MG PO TABS: 80.0000 mg | ORAL_TABLET | Freq: Every day | ORAL | Status: DC

## 2016-06-18 NOTE — Addendum Note (Signed)
Addended by: Kathleen ArgueAYLOR, Amaryah Mallen M on: 06/18/2016 09:23 AM   Modules accepted: Orders

## 2016-06-18 NOTE — Telephone Encounter (Signed)
Calling dr Ladona Ridgeltaylor back

## 2016-06-18 NOTE — Telephone Encounter (Signed)
Her creatinine is stable and we will increase her Lasix dosage from 40 mg once daily daily to 80 mg once daily. I tried to call her son Lyn Hollingsheadlexander this morning to the ladies changes to him but he did not answer. I will call the assisted living facility to also let them know of the changes. She will need to return to clinic in 1 week for repeat basic metabolic panel.

## 2016-06-18 NOTE — Telephone Encounter (Signed)
I spoke with her assisted living facility this morning. They requested that I fax over documentation showing the requested medication changes. We will send this fax. She should start taking 2 of her 40 mg Lasix pills once nightly for a total of 80 mg. When she completes this prescription her next prescription will be written for 80 mg and she will only need to take one pill at night.

## 2016-06-19 NOTE — Telephone Encounter (Signed)
I spoke with her son Lyn Hollingsheadlexander on the phone this afternoon. I told him that we had made the medication changes we discussed in the office this week.He had no additional questions. He said Miss Carey BullocksJarrell will follow up on 7/42/2017 for blood work.

## 2016-06-20 ENCOUNTER — Ambulatory Visit (HOSPITAL_COMMUNITY): Payer: Medicare Other

## 2016-06-21 NOTE — Addendum Note (Signed)
Addended by: Kathleen ArgueAYLOR, Joyell Emami M on: 06/21/2016 02:33 PM   Modules accepted: Orders

## 2016-06-24 ENCOUNTER — Other Ambulatory Visit (INDEPENDENT_AMBULATORY_CARE_PROVIDER_SITE_OTHER): Payer: Medicare Other

## 2016-06-24 ENCOUNTER — Ambulatory Visit: Payer: Medicare Other

## 2016-06-24 DIAGNOSIS — R600 Localized edema: Secondary | ICD-10-CM

## 2016-06-25 LAB — BMP8+ANION GAP
ANION GAP: 16 mmol/L (ref 10.0–18.0)
BUN/Creatinine Ratio: 30 — ABNORMAL HIGH (ref 12–28)
BUN: 47 mg/dL — AB (ref 8–27)
CO2: 29 mmol/L (ref 18–29)
CREATININE: 1.57 mg/dL — AB (ref 0.57–1.00)
Calcium: 9.5 mg/dL (ref 8.7–10.3)
Chloride: 96 mmol/L (ref 96–106)
GFR calc Af Amer: 34 mL/min/{1.73_m2} — ABNORMAL LOW (ref >59)
GFR calc non Af Amer: 29 mL/min/{1.73_m2} — ABNORMAL LOW (ref >59)
Glucose: 78 mg/dL (ref 65–99)
Potassium: 4.2 mmol/L (ref 3.5–5.2)
SODIUM: 141 mmol/L (ref 134–144)

## 2016-06-26 ENCOUNTER — Telehealth: Payer: Self-pay | Admitting: Internal Medicine

## 2016-06-26 NOTE — Telephone Encounter (Signed)
I attempted to call the patient's son to discuss recent lab values. Her creatinine is improved slightly on the increased diuretic dosage. She will need her echocardiogram which is scheduled for tomorrow as this will provide [place] the most information and help guide our next clinical decision. I will follow-up with them regarding the results of this study.

## 2016-06-27 ENCOUNTER — Other Ambulatory Visit: Payer: Self-pay | Admitting: Internal Medicine

## 2016-06-27 ENCOUNTER — Telehealth: Payer: Self-pay

## 2016-06-27 ENCOUNTER — Ambulatory Visit (HOSPITAL_COMMUNITY)
Admission: RE | Admit: 2016-06-27 | Discharge: 2016-06-27 | Disposition: A | Discharge status: Home / Self Care | Payer: Medicare Other | Source: Ambulatory Visit | Attending: Internal Medicine | Admitting: Internal Medicine

## 2016-06-27 DIAGNOSIS — I119 Hypertensive heart disease without heart failure: Secondary | ICD-10-CM | POA: Diagnosis not present

## 2016-06-27 DIAGNOSIS — I351 Nonrheumatic aortic (valve) insufficiency: Secondary | ICD-10-CM | POA: Insufficient documentation

## 2016-06-27 DIAGNOSIS — I071 Rheumatic tricuspid insufficiency: Secondary | ICD-10-CM | POA: Insufficient documentation

## 2016-06-27 DIAGNOSIS — I272 Other secondary pulmonary hypertension: Secondary | ICD-10-CM | POA: Diagnosis not present

## 2016-06-27 DIAGNOSIS — I340 Nonrheumatic mitral (valve) insufficiency: Secondary | ICD-10-CM | POA: Insufficient documentation

## 2016-06-27 DIAGNOSIS — R600 Localized edema: Secondary | ICD-10-CM | POA: Diagnosis not present

## 2016-06-27 DIAGNOSIS — R011 Cardiac murmur, unspecified: Secondary | ICD-10-CM | POA: Diagnosis present

## 2016-06-27 NOTE — Progress Notes (Signed)
  Echocardiogram 2D Echocardiogram has been performed.  [name] 06/27/2016, 12:24 PM

## 2016-06-27 NOTE — Telephone Encounter (Signed)
[name] from [facility] [employer] requesting to speak with a nurse regarding Lasix.

## 2016-06-27 NOTE — Telephone Encounter (Signed)
Spoke w/ facility, called pharmacy and gave VO for furosemide 80mg 

## 2016-07-01 ENCOUNTER — Encounter (HOSPITAL_COMMUNITY): Payer: Self-pay | Admitting: Emergency Medicine

## 2016-07-01 ENCOUNTER — Telehealth: Payer: Self-pay | Admitting: Internal Medicine

## 2016-07-01 ENCOUNTER — Emergency Department (HOSPITAL_COMMUNITY)
Admission: EM | Admit: 2016-07-01 | Discharge: 2016-07-01 | Disposition: A | Discharge status: Home / Self Care | Payer: Medicare Other | Attending: Emergency Medicine | Admitting: Emergency Medicine

## 2016-07-01 DIAGNOSIS — G309 Alzheimer's disease, unspecified: Secondary | ICD-10-CM | POA: Insufficient documentation

## 2016-07-01 DIAGNOSIS — I10 Essential (primary) hypertension: Secondary | ICD-10-CM | POA: Insufficient documentation

## 2016-07-01 DIAGNOSIS — Z85038 Personal history of other malignant neoplasm of large intestine: Secondary | ICD-10-CM | POA: Insufficient documentation

## 2016-07-01 DIAGNOSIS — R079 Chest pain, unspecified: Secondary | ICD-10-CM | POA: Diagnosis present

## 2016-07-01 DIAGNOSIS — N289 Disorder of kidney and ureter, unspecified: Secondary | ICD-10-CM | POA: Insufficient documentation

## 2016-07-01 DIAGNOSIS — Z79899 Other long term (current) drug therapy: Secondary | ICD-10-CM | POA: Diagnosis not present

## 2016-07-01 DIAGNOSIS — R600 Localized edema: Secondary | ICD-10-CM

## 2016-07-01 LAB — I-STAT CHEM 8, ED
BUN: 47 mg/dL — ABNORMAL HIGH (ref 6–20)
Calcium, Ion: 1.12 mmol/L (ref 1.12–1.23)
Chloride: 100 mmol/L — ABNORMAL LOW (ref 101–111)
Creatinine, Ser: 2 mg/dL — ABNORMAL HIGH (ref 0.44–1.00)
Glucose, Bld: 83 mg/dL (ref 65–99)
HEMATOCRIT: 33 % — AB (ref 36.0–46.0)
HEMOGLOBIN: 11.2 g/dL — AB (ref 12.0–15.0)
Potassium: 3.9 mmol/L (ref 3.5–5.1)
SODIUM: 139 mmol/L (ref 135–145)
TCO2: 30 mmol/L (ref 0–100)

## 2016-07-01 MED ORDER — FUROSEMIDE 80 MG PO TABS: 40.0000 mg | ORAL_TABLET | Freq: Every day | ORAL | 0 refills | Status: DC

## 2016-07-01 NOTE — Telephone Encounter (Signed)
I will call the patient today to discuss with her the results of her echocardiogram. I have also recommended that she make an appointment for [facility] clinic next week to reevaluate her lower extremity edema and repeat a basic metabolic panel. At this appointment please consider her diuresis goals and evaluate if additional diuresis is needed.

## 2016-07-01 NOTE — ED Triage Notes (Addendum)
Per EMS pt with Alzheimer's Dementia sent from [facility] for not being "as happy as she normally is." Pt denies complaints. EMS reports pt does appear mildly unwell. On exam: lungs clear, abdomen nontender, pt pleasant, smiling, laughing and joking, no apparent distress. Oriented to self only.

## 2016-07-01 NOTE — ED Provider Notes (Signed)
[facility]-EMERGENCY DEPT Provider Note   CSN: [id number] Arrival date & time: 07/01/16  4098  First Provider Contact:  First MD Initiated Contact with Patient 07/01/16 [phone]        History   Chief Complaint Chief Complaint  Patient presents with  . Fatigue    HPI [name] is a 82 y.o. female.   She presents for evaluation from her nursing care facility for episode of chest pain associated with high blood pressure, a report of her son who is with her. He last saw her 3 days ago when he took her for an echocardiogram. He feels that today she is at her baseline and appears normal. The patient states that she is hungry. Her son states that she missed breakfast. The patient cannot give additional history.  Level V caveat- dementia  HPI  Past Medical History:  Diagnosis Date  . Adenomatous colon polyp   . Cholelithiasis 01/13/2015  . Constipation   . Diverticulosis of colon   . Elevated alkaline phosphatase level    Chronic; GGT was normal (30) on 09/18/2004.  . Endometrial polyp    S/P operative hysteroscopy with removal of polyps, dilatation and curettage on 10/20/2000 by Dr. Antionette Char.  . Esophageal stricture   . Gastric ulcer 1991   H. pylori positive in 1991; EGD 01/31/00 by Dr. Juanda Chance showed mild benign distal esophageal stricture (dilator passed), gastritis with evidence of coffee-ground material, and a 4 cm non-reducible hiatal hernia; EGD  02/09/2008 showed hiatal hernia, and mild benign distal espohageal stricture, was otherwise normal.  . Gout   . Hiatal hernia   . Hypertension   . Iron deficiency anemia   . Lower GI bleed   . Malignant neoplasm of colon 1991   S/P right hemicolectomy in 1991 by Dr. Jamey Ripa, reportedly done for a malignant polyp; colonoscopy by Dr. [name] 02/09/2008 showed  marked left colon diverticulosis with partial narrowing, S/P  remote right hemicolectomy.  . Osteopenia    DXA bone density scan on 10/16/2009 showed osteopenia with  an AP spine T-score of -1.5, left femoral neck T-score of -1.7, and right femoral neck T-score of -2.0.  Her FRAX score gave an estimated ten-year probability of major osteoporotic fracture of 7.3%, and a ten-year probability of hip fracture of 2.1%.  . Renal insufficiency   . Sensorineural hearing loss of both ears    Audiological evaluation on February 05, 2007 showed a slight to mild loss in both ears through 4000 Hz, with a moderate to severe precipitous drop in the highest frequencies (6000 to 8000 Hz).  . Tinnitus, bilateral    Audiological evaluation on February 05, 2007 showed a slight to mild loss of hearing in both ears through 4000 Hz, with a moderate to severe precipitous drop in the highest frequencies (6000 to 8000 Hz); her speech recognition abilities were within normal limits and a slightly elevated conversational level.  [name] Unsteady gait 08/07/2011  . Vitamin D deficiency    Vitamin D (25-Hydroxy) <10 ng/mL on 12/07/2009.    Patient Active Problem List   Diagnosis Date Noted  . Bilateral lower extremity edema 06/07/2016  . Healthcare maintenance 01/13/2015  . Cholelithiasis 01/13/2015  . Dementia of the Alzheimer's type 04/21/2013  . CONSTIPATION 10/15/2010  . Iron deficiency anemia 08/29/2010  . CKD (chronic kidney disease) 06/06/2010  . VITAMIN D DEFICIENCY 12/07/2009  . Osteopenia 12/06/2009  . FATIGUE 01/18/2009  . Gout 04/22/2007  . LOSS, SENSORINEURAL HEARING, BILAT 04/22/2007  .  Essential hypertension 04/22/2007  . Diverticulosis of large intestine 04/22/2007  . Alkaline phosphatase elevation 04/22/2007  . COLONIC POLYPS, ADENOMATOUS, HX OF 04/22/2007  . COLECTOMY, PARTIAL, WITH ANASTOMOSIS, HX OF 04/22/2007    Past Surgical History:  Procedure Laterality Date  . HEMICOLECTOMY  1991   Right -  with ileocolic anastamosis by Dr. Jamey Ripa  . HYSTEROSCOPY     S/P operative hysteroscopy with removal of polyps, dilatation and curettage on 10/20/2000 by Dr. Antionette Char.     OB History    Gravida Para Term Preterm AB Living   6 6 6     6    SAB TAB Ectopic Multiple Live Births                   Home Medications    Prior to Admission medications   Medication Sig Start Date End Date Taking? Authorizing Provider  allopurinol (ZYLOPRIM) 100 MG tablet Take 2 tablets (200 mg total) by mouth daily. 04/01/16  Yes Tasrif Ahmed, MD  Calcium Carbonate-Vitamin D 600-400 MG-UNIT per tablet Take 1 tablet by mouth 2 (two) times daily. 02/03/15  Yes Margarito Liner, MD  diltiazem (TIAZAC) 420 MG 24 hr capsule TAKE 1 CAPSULE (420 MG TOTAL) BY MOUTH DAILY. 02/06/16  Yes Tasrif Ahmed, MD  ferrous sulfate 325 (65 FE) MG tablet TAKE 1 TABLET BY MOUTH TWICE A DAY WITH A MEAL 06/27/16  Yes Tasrif Ahmed, MD  losartan (COZAAR) 25 MG tablet Take 1 tablet (25 mg total) by mouth daily. 04/05/16  Yes Tasrif Ahmed, MD  memantine (NAMENDA) 10 MG tablet Take 1 tablet (10 mg total) by mouth daily. 04/01/16  Yes Tasrif Ahmed, MD  rivastigmine (EXELON) 4.6 mg/24hr Place 1 patch (4.6 mg total) onto the skin daily. 04/01/16  Yes Tasrif Ahmed, MD  ferrous gluconate (FERGON) 225 (27 FE) MG tablet Take 1 tablet (240 mg total) by mouth 2 (two) times daily with a meal. Patient not taking: Reported on 07/01/2016 02/03/15   Margarito Liner, MD  furosemide (LASIX) 80 MG tablet Take 0.5 tablets (40 mg total) by mouth daily. 07/01/16 07/01/17  Mancel Bale, MD    Family History Family History  Problem Relation Age of Onset  . Breast cancer Maternal Aunt   . Colon cancer Neg Hx     Social History Social History  Substance Use Topics  . Smoking status: Never Smoker  . Smokeless tobacco: Never Used  . Alcohol use No     Allergies   Review of patient's allergies indicates no known allergies.   Review of Systems Review of Systems  Unable to perform ROS: Dementia     Physical Exam Updated Vital Signs BP 136/88 (BP Location: Right Arm)   Pulse 68   Temp 97.9 F (36.6 C) (Oral)   Resp 18   SpO2 100%     Physical Exam  Constitutional: She appears well-developed.  Elderly, frail  HENT:  Head: Normocephalic and atraumatic.  Eyes: Conjunctivae and EOM are normal. Pupils are equal, round, and reactive to light.  Neck: Normal range of motion and phonation normal. Neck supple.  Cardiovascular: Normal rate and regular rhythm.   Pulmonary/Chest: Effort normal and breath sounds normal. No respiratory distress. She exhibits no tenderness.  Abdominal: Soft. She exhibits no distension. There is no tenderness. There is no guarding.  Musculoskeletal: Normal range of motion.  Neurological: She is alert. No cranial nerve deficit. She exhibits normal muscle tone.  Skin: Skin is warm and dry.  Psychiatric: She has  a normal mood and affect. Her behavior is normal.  Nursing note and vitals reviewed.    ED Treatments / Results  Labs (all labs ordered are listed, but only abnormal results are displayed) Labs Reviewed  I-STAT CHEM 8, ED - Abnormal; Notable for the following:       Result Value   Chloride 100 (*)    BUN 47 (*)    Creatinine, Ser 2.00 (*)    Hemoglobin 11.2 (*)    HCT 33.0 (*)    All other components within normal limits    EKG  EKG Interpretation  Date/Time:  Monday July 01 2016 10:04:45 EDT Ventricular Rate:  62 PR Interval:    QRS Duration: 92 QT Interval:  433 QTC Calculation: 440 R Axis:   -27 Text Interpretation:  Sinus rhythm Left atrial enlargement Borderline left axis deviation Borderline low voltage, extremity leads Consider anterior infarct ST elevation, consider inferior injury since last tracing no significant change Confirmed by Effie Shy  MD, Fatina Sprankle ([id number]) on 07/01/2016 10:34:53 AM       Radiology No results found.  Procedures Procedures (including critical care time)  Medications Ordered in ED Medications - No data to display   Initial Impression / Assessment and Plan / ED Course  I have reviewed the triage vital signs and the nursing  notes.  Pertinent labs & imaging results that were available during my care of the patient were reviewed by me and considered in my medical decision making (see chart for details).  Clinical Course    Medications - No data to display  Patient Vitals for the past 24 hrs:  BP Temp Temp src Pulse Resp SpO2  07/01/16 1102 136/88 - Oral 68 18 100 %  07/01/16 1030 - - - 67 18 100 %  07/01/16 0956 148/77 - - 63 - 99 %  07/01/16 0846 125/71 97.9 F (36.6 C) Oral 65 16 100 %  07/01/16 0841 - - - - - 99 %    11:08 AM Reevaluation with update and discussion. After initial assessment and treatment, an updated evaluation reveals , At this time. She has been able to eat and drink some. Patient's family was updated on the findings, and all questions were answered. Dustina Scoggin L    Final Clinical Impressions(s) / ED Diagnoses   Final diagnoses:  Chest pain, unspecified chest pain type  Acute renal insufficiency    Nonspecific chest pain, with reassuring evaluation. Doubt ACS, PE or pneumonia. Recent change of Lasix has apparently caused elevation in creatinine, higher than the recent baseline, indicative of mild AKI/acute renal insufficiency. She does not appear to be tolerating this higher dose of Lasix. Therefore, will reinitiate the dose of Lasix 40 mg daily. I have asked family members to see that she gets repeat testing for creatinine level in 3 days time at her PCP office.   Nursing Notes Reviewed/ Care Coordinated Applicable Imaging Reviewed Interpretation of Laboratory Data incorporated into ED treatment  The patient appears reasonably screened and/or stabilized for discharge and I doubt any other medical condition or other [facility] requiring further screening, evaluation, or treatment in the ED at this time prior to discharge.  Plan: Home Medications- decrease Lasix to  q day, continue rest; Home Treatments- elevate feet, low salt diet; return here if the recommended treatment, does  not improve the symptoms; Recommended follow up- PCP 3-4 days>>repeat Creat.    New Prescriptions Current Discharge Medication List       Mancel Bale, MD 07/01/16  1110  

## 2016-07-01 NOTE — Discharge Instructions (Signed)
The tests today showed that your creatinine level has risen about 30%.  This is likely due to the recent increase of Lasix from 40-80 mg.  Therefore, you should revert to the previous dose of 40 mg of Lasix, each day.  For the leg edema, elevate your legs above your heart, as much as possible.  Try to avoid excess salt intake, as that will tend to cause you to retain fluids.

## 2016-07-01 NOTE — ED Notes (Signed)
Patient given orange juice. Will reassess

## 2016-07-01 NOTE — ED Notes (Signed)
Bed: JQ30 Expected date:  Expected time:  Means of arrival:  Comments: EMS- 82yo F, "appears to not feel well"/no complaints

## 2016-07-01 NOTE — Telephone Encounter (Signed)
When I attempted to call the patient earlier this morning to relate information of the echocardiogram it was noted in the electronic medical record that the patient was at the [facility] emergency department. She presented with atypical chest pain and fatigue and was ruled out for any acute coronary syndrome or other cardiac pathology. On her basic metabolic panel her creatinine was elevated above her baseline representing acute kidney injury most likely secondary to prerenal failure in the setting of increased Lasix. She'll return to clinic this week for repeat of her basic metabolic panel and titration of her Lasix dose. The emergency department providers decreased her Lasix from 80 mg daily to 40 mg daily. Please assess her need for Lasix at her upcoming visit.

## 2016-07-01 NOTE — Telephone Encounter (Signed)
I spoke with the patient this afternoon. She will have an appointment this Friday in clinic. At this appointment please review her basic metabolic panel and make adjustments to her Lasix as necessary.

## 2016-07-01 NOTE — Addendum Note (Signed)
Addended by: Kathleen Argue on: 07/01/2016 02:06 PM   Modules accepted: Orders

## 2016-07-03 ENCOUNTER — Encounter: Payer: Self-pay | Admitting: Internal Medicine

## 2016-07-03 ENCOUNTER — Other Ambulatory Visit: Payer: Self-pay | Admitting: Internal Medicine

## 2016-07-03 ENCOUNTER — Ambulatory Visit (INDEPENDENT_AMBULATORY_CARE_PROVIDER_SITE_OTHER): Payer: Medicare Other | Admitting: Internal Medicine

## 2016-07-03 DIAGNOSIS — R600 Localized edema: Secondary | ICD-10-CM

## 2016-07-03 LAB — BASIC METABOLIC PANEL
ANION GAP: 9 (ref 5–15)
BUN: 35 mg/dL — ABNORMAL HIGH (ref 6–20)
CHLORIDE: 101 mmol/L (ref 101–111)
CO2: 29 mmol/L (ref 22–32)
Calcium: 9.5 mg/dL (ref 8.9–10.3)
Creatinine, Ser: 1.62 mg/dL — ABNORMAL HIGH (ref 0.44–1.00)
GFR calc Af Amer: 32 mL/min — ABNORMAL LOW (ref >60)
GFR calc non Af Amer: 27 mL/min — ABNORMAL LOW (ref >60)
GLUCOSE: 107 mg/dL — AB (ref 65–99)
POTASSIUM: 4 mmol/L (ref 3.5–5.1)
Sodium: 139 mmol/L (ref 135–145)

## 2016-07-03 NOTE — Progress Notes (Signed)
CC: Bilateral lower extremity swelling  HPI:  [name] is a 82 y.o. female with PMHx of HTN, Gout and Alzheimer's dementia that presents to the clinic today for ED follow up.  Her son is her caregiver and has accompanied her today.  Patient's main concern is her bilateral lower extremity swelling first noticed 3 months ago.  She was started on Lasix approximately 4 weeks ago at  once daily.  2 weeks ago Lasix was increased to  once daily however, was stopped due to recent ED visit and discharged on  once daily.  Son stated that the nursing facility did not notice a significant increase in urine output since she has started lasix.  Patient denies SOB, chest pain, bilateral leg pain.         Past Medical History:  Diagnosis Date  . Adenomatous colon polyp   . Cholelithiasis 01/13/2015  . Constipation   . Diverticulosis of colon   . Elevated alkaline phosphatase level    Chronic; GGT was normal (30) on 09/18/2004.  . Endometrial polyp    S/P operative hysteroscopy with removal of polyps, dilatation and curettage on 10/20/2000 by Dr. Antionette Duncan.  . Esophageal stricture   . Gastric ulcer 1991   H. pylori positive in 1991; EGD 01/31/00 by Dr. Juanda Duncan showed mild benign distal esophageal stricture (dilator passed), gastritis with evidence of coffee-ground material, and a 4 cm non-reducible hiatal hernia; EGD  02/09/2008 showed hiatal hernia, and mild benign distal espohageal stricture, was otherwise normal.  . Gout   . Hiatal hernia   . Hypertension   . Iron deficiency anemia   . Lower GI bleed   . Malignant neoplasm of colon 1991   S/P right hemicolectomy in 1991 by Dr. Jamey Duncan, reportedly done for a malignant polyp; colonoscopy by Dr. Lina Duncan 02/09/2008 showed  marked left colon diverticulosis with partial narrowing, S/P  remote right hemicolectomy.  . Osteopenia    DXA bone density scan on 10/16/2009 showed osteopenia with an AP spine T-score of -1.5, left  femoral neck T-score of -1.7, and right femoral neck T-score of -2.0.  Her FRAX score gave an estimated ten-year probability of major osteoporotic fracture of 7.3%, and a ten-year probability of hip fracture of 2.1%.  . Renal insufficiency   . Sensorineural hearing loss of both ears    Audiological evaluation on February 05, 2007 showed a slight to mild loss in both ears through 4000 Hz, with a moderate to severe precipitous drop in the highest frequencies (6000 to 8000 Hz).  . Tinnitus, bilateral    Audiological evaluation on February 05, 2007 showed a slight to mild loss of hearing in both ears through 4000 Hz, with a moderate to severe precipitous drop in the highest frequencies (6000 to 8000 Hz); her speech recognition abilities were within normal limits and a slightly elevated conversational level.  [name] Unsteady gait 08/07/2011  . Vitamin D deficiency    Vitamin D (25-Hydroxy) <10 ng/mL on 12/07/2009.    Review of Systems:  Review of Systems  All other systems reviewed and are negative.   Physical Exam:  Vitals:   07/03/16 0945  BP: 134/78  Pulse: 70  Temp: 97.5 F (36.4 C)  TempSrc: Oral  SpO2: 100%  Weight: 133 lb 1.6 oz (60.4 kg)  Height:  (1.448 m)   Physical Exam  HENT:  Head: Normocephalic and atraumatic.  Mouth/Throat: Oropharynx is clear and moist.  Eyes: Pupils are equal, round, and reactive to  light.  Cardiovascular: Normal rate.   Murmur noted, not new   Pulmonary/Chest: Effort normal and breath sounds normal.  Abdominal: Soft. There is no tenderness.  Musculoskeletal: She exhibits edema.  Bilateral lower extremity leg swelling to mid shin  Skin: Skin is warm and dry.     Assessment & Plan:     Patient seen with Dr. Heide Duncan

## 2016-07-03 NOTE — Assessment & Plan Note (Signed)
Assessment The lower leg edema was first noticed approximately 3 months ago.  She was seen in clinic on the 7/07 and started on Lasix 40mg  daily. Her creatinine was 1.42 and close to her baseline of 1.6.   She was re-evaluated on 7/17 and she had not reached appropriate diuresis and her creatinine was stable at 1.62.  Lasix was increased to 80mg  once daily and after one week her creatinine was 1.57.  She went to the Emergency Department on 7/31 for unspecified chest pain.  In the ED her creatinine had bumped to 2.0 from her baseline of 1.6.  Lasix was held and discharged on 40mg  once daily.  Patient also had an Echo done on 7/27 showing Normal LV size with EF 60-65%. Normal RV size and systolic function. Mild aortic insufficiency, mild MR, moderate TR. Mild pulmonary hypertension.  Patient's medications includes Diltiazem 420mg  daily, Allopurinol, Memantine and rivastigmine.  She has been on Diltiazem for several years and not likely the cause of her leg swelling.  TSH was checked to see if contributing.  Her in office creatinine today was 1.62 and appears back to baseline.  She was counseled on a low salt diet.  She was measured for stockings and received them in office.   Plan - Continue Lasix at 40mg  once daily  - Follow up in 2 weeks  - TSH - Compression stockings

## 2016-07-03 NOTE — Telephone Encounter (Signed)
Last appt 06/07/16. Labs : 7/31. Next appt 10/07/16.

## 2016-07-03 NOTE — Patient Instructions (Signed)
[name] please continue to take Lasix 40 mg once daily Continue a low salt diet Please wear your compression stockings during the day.  And work on keeping your legs elevated when resting.

## 2016-07-04 LAB — TSH: TSH: 2.17 u[IU]/mL (ref 0.450–4.500)

## 2016-07-05 ENCOUNTER — Ambulatory Visit: Payer: Medicare Other

## 2016-07-12 NOTE — Progress Notes (Signed)
Internal Medicine Clinic Attending  I saw and evaluated the patient.  I personally confirmed the key portions of the history and exam documented by Dr. Hoffman and I reviewed pertinent patient test results.  The assessment, diagnosis, and plan were formulated together and I agree with the documentation in the resident's note.      

## 2016-07-17 ENCOUNTER — Ambulatory Visit (INDEPENDENT_AMBULATORY_CARE_PROVIDER_SITE_OTHER): Payer: Medicare Other | Admitting: Internal Medicine

## 2016-07-17 ENCOUNTER — Encounter: Payer: Self-pay | Admitting: Internal Medicine

## 2016-07-17 DIAGNOSIS — R600 Localized edema: Secondary | ICD-10-CM

## 2016-07-17 NOTE — Progress Notes (Signed)
CC: Follow up visit for chronic bilateral lower extremity swelling   HPI:  [name] is a 82 y.o.female with PMHx as noted below that [facility] follow up on chronic bilateral leg swelling.  She lives in a nursing facility and is accompanied by her son today.  Son verifies that patient is continuing to take Lasix 40mg  daily.  He visits her 2-3 days a week and states she appears to be at her baseline health.  He states the leg swelling is much improved since last clinic visit on 8/2.  At that visit in the office patient was measured and given a pair of compression stockings.  Son states she wears those daily during the daytime at the nursing facility. Patient has no complaints or concerns at this time.    Past Medical History:  Diagnosis Date  . Adenomatous colon polyp   . Cholelithiasis 01/13/2015  . Constipation   . Diverticulosis of colon   . Elevated alkaline phosphatase level    Chronic; GGT was normal (30) on 09/18/2004.  . Endometrial polyp    S/P operative hysteroscopy with removal of polyps, dilatation and curettage on 10/20/2000 by Dr. Antionette CharLisa Jackson-Moore.  . Esophageal stricture   . Gastric ulcer 1991   H. pylori positive in 1991; EGD 01/31/00 by Dr. Juanda ChanceBrodie showed mild benign distal esophageal stricture (dilator passed), gastritis with evidence of coffee-ground material, and a 4 cm non-reducible hiatal hernia; EGD  02/09/2008 showed hiatal hernia, and mild benign distal espohageal stricture, was otherwise normal.  . Gout   . Hiatal hernia   . Hypertension   . Iron deficiency anemia   . Lower GI bleed   . Malignant neoplasm of colon 1991   S/P right hemicolectomy in 1991 by Dr. Jamey RipaStreck, reportedly done for a malignant polyp; colonoscopy by Dr. Lina Sarora Brodie 02/09/2008 showed  marked left colon diverticulosis with partial narrowing, S/P  remote right hemicolectomy.  . Osteopenia    DXA bone density scan on 10/16/2009 showed osteopenia with an AP spine T-score of -1.5,  left femoral neck T-score of -1.7, and right femoral neck T-score of -2.0.  Her FRAX score gave an estimated ten-year probability of major osteoporotic fracture of 7.3%, and a ten-year probability of hip fracture of 2.1%.  . Renal insufficiency   . Sensorineural hearing loss of both ears    Audiological evaluation on February 05, 2007 showed a slight to mild loss in both ears through 4000 Hz, with a moderate to severe precipitous drop in the highest frequencies (6000 to 8000 Hz).  . Tinnitus, bilateral    Audiological evaluation on February 05, 2007 showed a slight to mild loss of hearing in both ears through 4000 Hz, with a moderate to severe precipitous drop in the highest frequencies (6000 to 8000 Hz); her speech recognition abilities were within normal limits and a slightly elevated conversational level.  [name]. Unsteady gait 08/07/2011  . Vitamin D deficiency    Vitamin D (25-Hydroxy) <10 ng/mL on 12/07/2009.    Review of Systems:  Review of Systems  Eyes: Negative for blurred vision.  Respiratory: Negative for shortness of breath.   Cardiovascular: Negative for chest pain.  Gastrointestinal: Negative for abdominal pain.  Neurological: Negative for dizziness.     Physical Exam:  Vitals:   07/17/16 1114  BP: 134/70  Pulse: 63  Temp: 97.7 F (36.5 C)  TempSrc: Oral  SpO2: 100%  Weight: 133 lb 9.6 oz (60.6 kg)  Height: 4\' 9"  (1.448  m)   Physical Exam  Constitutional: She is well-developed, well-nourished, and in no distress.  Cardiovascular:  Murmur noted, not new   Pulmonary/Chest: Effort normal and breath sounds normal. No respiratory distress.  Abdominal: Soft. There is no tenderness.  Musculoskeletal:  Bilateral non-pitting edema  Skin: Skin is warm and dry. No rash noted.  No open wounds or drainage from skin     Assessment & Plan:   See encounters tab for problem based medical decision making.   Patient seen with Dr. Oswaldo DoneVincent

## 2016-07-17 NOTE — Patient Instructions (Signed)
[name]  It was a pleasure seeing you today. Please continue to take Lasix 40mg  daily Please use your compression stockings every day during the day And elevate your legs when you are resting.  Please call if symptoms become worse

## 2016-07-18 NOTE — Progress Notes (Signed)
Internal Medicine Clinic Attending  I saw and evaluated the patient.  I personally confirmed the key portions of the history and exam documented by Dr. Hoffman and I reviewed pertinent patient test results.  The assessment, diagnosis, and plan were formulated together and I agree with the documentation in the resident's note.      

## 2016-07-18 NOTE — Assessment & Plan Note (Addendum)
Assessment:  Chronic bilateral lower extremity swelling  Per son's history patient's leg swelling has improved since last clinic visit.  I had seen patient on last visit and agree that the swelling has gone down some.  She received compression stockings at last visit and she has been wearing them daily at her nursing facility.  Patient has no complaints or concerns at this time  Plan  - Counciled on continuing to wear compression stockings daily and elevating legs when resting - Continue lasix 40mg  daily - Told to return if noticed increased swelling of skin changes on legs.

## 2016-08-01 ENCOUNTER — Other Ambulatory Visit: Payer: Self-pay | Admitting: Internal Medicine

## 2016-08-21 ENCOUNTER — Other Ambulatory Visit: Payer: Self-pay | Admitting: *Deleted

## 2016-08-23 MED ORDER — CALCIUM CARBONATE-VITAMIN D 600-400 MG-UNIT PO TABS: 1.0000 | ORAL_TABLET | Freq: Two times a day (BID) | ORAL | 2 refills | Status: DC

## 2016-09-03 ENCOUNTER — Other Ambulatory Visit: Payer: Self-pay | Admitting: Internal Medicine

## 2016-09-03 NOTE — Telephone Encounter (Signed)
Has Nov appt 

## 2016-09-05 ENCOUNTER — Other Ambulatory Visit: Payer: Self-pay | Admitting: Internal Medicine

## 2016-09-05 DIAGNOSIS — R600 Localized edema: Secondary | ICD-10-CM

## 2016-09-05 MED ORDER — FUROSEMIDE 80 MG PO TABS: 40.0000 mg | ORAL_TABLET | Freq: Every day | ORAL | 1 refills | Status: DC

## 2016-09-05 NOTE — Telephone Encounter (Signed)
Pt request a refill on furosemide (LASIX) 80 MG tablet

## 2016-09-05 NOTE — Telephone Encounter (Signed)
Called pt's son - informed Diltiazem was refilled on 10/3 and electronic [facility]. Stated he will call the pharmacy.

## 2016-09-05 NOTE — Telephone Encounter (Signed)
diltiazem (TIAZAC) 420 MG 24 hr capsul [facility]

## 2016-09-11 ENCOUNTER — Other Ambulatory Visit: Payer: Self-pay | Admitting: Internal Medicine

## 2016-09-20 ENCOUNTER — Encounter: Payer: Self-pay | Admitting: *Deleted

## 2016-10-07 ENCOUNTER — Encounter: Payer: Medicare Other | Admitting: Internal Medicine

## 2016-10-21 ENCOUNTER — Ambulatory Visit (INDEPENDENT_AMBULATORY_CARE_PROVIDER_SITE_OTHER): Payer: Medicare Other | Admitting: Internal Medicine

## 2016-10-21 ENCOUNTER — Encounter: Payer: Self-pay | Admitting: Internal Medicine

## 2016-10-21 VITALS — BP 95/62 | HR 86 | Temp 97.4°F | Ht <= 58 in | Wt 140.4 lb

## 2016-10-21 DIAGNOSIS — Z79899 Other long term (current) drug therapy: Secondary | ICD-10-CM

## 2016-10-21 DIAGNOSIS — I10 Essential (primary) hypertension: Secondary | ICD-10-CM | POA: Diagnosis present

## 2016-10-21 DIAGNOSIS — I872 Venous insufficiency (chronic) (peripheral): Secondary | ICD-10-CM

## 2016-10-21 DIAGNOSIS — G309 Alzheimer's disease, unspecified: Secondary | ICD-10-CM

## 2016-10-21 DIAGNOSIS — F0280 Dementia in other diseases classified elsewhere without behavioral disturbance: Secondary | ICD-10-CM

## 2016-10-21 NOTE — Assessment & Plan Note (Signed)
BP today 95/62 HR 86. On losartan 25mg  daily + lasix 40mg  daily + dilt 420 24hr capsule. Despite her low BP, she is not dizzy or having any other symptoms.   Will cont same regimen as she is not symptomatic from her low BP. If she has problem, asked the son to call me and we can adjust her meds.

## 2016-10-21 NOTE — Progress Notes (Signed)
CC: f/up for HTN, dementia.  HPI:  [name] is a 82 y.o. with pmh as listed below presents for follow up for HTN, Alzeimer's dementia.   Past Medical History:  Diagnosis Date  . Adenomatous colon polyp   . Cholelithiasis 01/13/2015  . Constipation   . Diverticulosis of colon   . Elevated alkaline phosphatase level    Chronic; GGT was normal (30) on 09/18/2004.  . Endometrial polyp    S/P operative hysteroscopy with removal of polyps, dilatation and curettage on 10/20/2000 by Dr. Antionette CharLisa Jackson-Moore.  . Esophageal stricture   . Gastric ulcer 1991   H. pylori positive in 1991; EGD 01/31/00 by Dr. Juanda ChanceBrodie showed mild benign distal esophageal stricture (dilator passed), gastritis with evidence of coffee-ground material, and a 4 cm non-reducible hiatal hernia; EGD  02/09/2008 showed hiatal hernia, and mild benign distal espohageal stricture, was otherwise normal.  . Gout   . Hiatal hernia   . Hypertension   . Iron deficiency anemia   . Lower GI bleed   . Malignant neoplasm of colon 1991   S/P right hemicolectomy in 1991 by Dr. Jamey RipaStreck, reportedly done for a malignant polyp; colonoscopy by Dr. Lina Sarora Brodie 02/09/2008 showed  marked left colon diverticulosis with partial narrowing, S/P  remote right hemicolectomy.  . Osteopenia    DXA bone density scan on 10/16/2009 showed osteopenia with an AP spine T-score of -1.5, left femoral neck T-score of -1.7, and right femoral neck T-score of -2.0.  Her FRAX score gave an estimated ten-year probability of major osteoporotic fracture of 7.3%, and a ten-year probability of hip fracture of 2.1%.  . Renal insufficiency   . Sensorineural hearing loss of both ears    Audiological evaluation on February 05, 2007 showed a slight to mild loss in both ears through 4000 Hz, with a moderate to severe precipitous drop in the highest frequencies (6000 to 8000 Hz).  . Tinnitus, bilateral    Audiological evaluation on February 05, 2007 showed a slight to mild loss of  hearing in both ears through 4000 Hz, with a moderate to severe precipitous drop in the highest frequencies (6000 to 8000 Hz); her speech recognition abilities were within normal limits and a slightly elevated conversational level.  [name]. Unsteady gait 08/07/2011  . Vitamin D deficiency    Vitamin D (25-Hydroxy) <10 ng/mL on 12/07/2009.   HTN: BP today 95/62 HR 86. On losartan 25mg  daily + lasix 40mg  daily + dilt 420 24hr capsule. Despite her low BP, she is not dizzy or having any other symptoms.   alzeimer's dementia - on exelon patch and namenda. Her dementia is stable per son. She is living in nursing home. Enjoys being there. Likes to dance with other residents there. No complaints or signs of abuse.    Review of Systems:   Review of Systems  Constitutional: Negative for chills and fever.  HENT: Negative for hearing loss.   Cardiovascular: Positive for leg swelling. Negative for chest pain and palpitations.  Genitourinary: Negative for dysuria.  Neurological: Negative for dizziness, tingling, tremors and headaches.  Psychiatric/Behavioral: Positive for memory loss. Negative for depression.     Physical Exam:  Vitals:   10/21/16 1450  BP: 95/62  Pulse: 86  Temp: 97.4 F (36.3 C)  TempSrc: Oral  SpO2: 100%  Weight: 140 lb 6.4 oz (63.7 kg)  Height: 4\' 9"  (1.448 m)   Physical Exam  Constitutional: She appears well-developed and well-nourished. No distress.  Pleasantly demented. A xo  to self only. Smiling appropriately.   HENT:  Head: Normocephalic and atraumatic.  Eyes: Conjunctivae are normal.  Cardiovascular: Exam reveals no gallop and no friction rub.   No murmur heard. Respiratory: Effort normal and breath sounds normal.  Musculoskeletal:  Chronic venous stasis skin changes.\ Has swelling of both legs upto the knees. Wearing compression socks.   Neurological: No cranial nerve deficit.  Skin: She is not diaphoretic.    Assessment & Plan:   See Encounters Tab for  problem based charting.  Patient discussed with Dr. Josem KaufmannKlima

## 2016-10-21 NOTE — Assessment & Plan Note (Signed)
She is doing well, pleasantly demented. Dementia is stable per son.   Will cont exelon patch and namenda.

## 2016-10-21 NOTE — Progress Notes (Signed)
Case discussed with Dr. Ahmed at the time of the visit.  We reviewed the resident's history and exam and pertinent patient test results.  I agree with the assessment, diagnosis and plan of care documented in the resident's note. 

## 2016-10-21 NOTE — Patient Instructions (Addendum)
I am glad you are doing so well.  Keep taking the same medications.  Follow up with me in 6 months.   If your blood pressure drops further and you become dizzy or lightheaded please call me.

## 2016-10-28 ENCOUNTER — Other Ambulatory Visit: Payer: Self-pay | Admitting: Oncology

## 2016-10-28 ENCOUNTER — Telehealth: Payer: Self-pay

## 2016-10-28 DIAGNOSIS — D509 Iron deficiency anemia, unspecified: Secondary | ICD-10-CM

## 2016-10-28 NOTE — Telephone Encounter (Signed)
Pt son requesting to speak with a nurse regarding furosemide.

## 2016-10-28 NOTE — Telephone Encounter (Signed)
Rtc, lm for rtc 

## 2016-10-29 ENCOUNTER — Other Ambulatory Visit: Payer: Self-pay | Admitting: *Deleted

## 2016-10-29 NOTE — Telephone Encounter (Signed)
Patient's son is calling again asking to have [facility] [address].

## 2016-10-29 NOTE — Telephone Encounter (Signed)
Please call pt son back.

## 2016-10-29 NOTE — Telephone Encounter (Signed)
Only needed 3 meds refilled, reviewed all meds, sent to dr Tasia Catchingsahmed

## 2016-10-30 MED ORDER — FERROUS SULFATE 325 (65 FE) MG PO TABS: 325.0000 mg | ORAL_TABLET | Freq: Two times a day (BID) | ORAL | 1 refills | Status: DC

## 2016-10-30 MED ORDER — CALCIUM CARBONATE-VITAMIN D 600-400 MG-UNIT PO TABS: 1.0000 | ORAL_TABLET | Freq: Two times a day (BID) | ORAL | 1 refills | Status: DC

## 2016-10-30 MED ORDER — LOSARTAN POTASSIUM 25 MG PO TABS: 25.0000 mg | ORAL_TABLET | Freq: Every day | ORAL | 1 refills | Status: DC

## 2016-11-04 ENCOUNTER — Other Ambulatory Visit: Payer: Self-pay

## 2016-11-04 NOTE — Telephone Encounter (Signed)
Son states [facility] has been giving pt 2 of the furosemide 80mg  daily instead of 1/2 of the 80mg  tablet, pt is now out, he states he must pay someone to bring pt in to clinic but he was informed that pt may need some labwork before refilling medication, he states the facility just told him they were out of her furosemide yesterday and that's when he ask more questions and found that she has been being given 4 times the amount daily  Please advise

## 2016-11-04 NOTE — Telephone Encounter (Signed)
Please call pt son back regarding meds.

## 2016-11-05 ENCOUNTER — Telehealth: Payer: Self-pay | Admitting: *Deleted

## 2016-11-05 DIAGNOSIS — R600 Localized edema: Secondary | ICD-10-CM

## 2016-11-05 MED ORDER — FUROSEMIDE 80 MG PO TABS: 80.0000 mg | ORAL_TABLET | Freq: Every day | ORAL | 1 refills | Status: DC

## 2016-11-05 NOTE — Telephone Encounter (Signed)
She was doing well when I saw her in the clinic. BP was slightly low but she was asymptomatic. I am not sure how long they have been doing the lasix 80mg  two tablets instead of 80 mg 1/2 tablet daily but she was euvolemic. Lets change the lasix to 80mg  once daily and have her come in for BMET in 1 week or have them check BMET at the facility if they can and fax it to us in 1 week.

## 2016-11-05 NOTE — Telephone Encounter (Signed)
Increased lasix to 80mg  daily.

## 2016-11-06 NOTE — Telephone Encounter (Signed)
Spoke to son, pt will return for labs as scheduled

## 2016-11-11 NOTE — Addendum Note (Signed)
Addended by: Hyacinth MeekerAHMED, Shannah Conteh on: 11/11/2016 08:25 AM   Modules accepted: Orders

## 2016-11-15 ENCOUNTER — Other Ambulatory Visit (INDEPENDENT_AMBULATORY_CARE_PROVIDER_SITE_OTHER): Payer: Medicare Other

## 2016-11-15 DIAGNOSIS — R600 Localized edema: Secondary | ICD-10-CM | POA: Diagnosis not present

## 2016-11-15 LAB — BASIC METABOLIC PANEL
Anion gap: 11 (ref 5–15)
BUN: 38 mg/dL — AB (ref 6–20)
CALCIUM: 9.6 mg/dL (ref 8.9–10.3)
CHLORIDE: 98 mmol/L — AB (ref 101–111)
CO2: 29 mmol/L (ref 22–32)
CREATININE: 1.47 mg/dL — AB (ref 0.44–1.00)
GFR calc non Af Amer: 31 mL/min — ABNORMAL LOW (ref >60)
GFR, EST AFRICAN AMERICAN: 36 mL/min — AB (ref >60)
Glucose, Bld: 122 mg/dL — ABNORMAL HIGH (ref 65–99)
Potassium: 3.4 mmol/L — ABNORMAL LOW (ref 3.5–5.1)
SODIUM: 138 mmol/L (ref 135–145)

## 2016-11-15 NOTE — Addendum Note (Signed)
Addended by: Bufford SpikesFULCHER, Rohith Fauth N on: 11/15/2016 11:18 AM   Modules accepted: Orders

## 2016-11-27 ENCOUNTER — Other Ambulatory Visit: Payer: Self-pay | Admitting: Internal Medicine

## 2016-11-27 DIAGNOSIS — E876 Hypokalemia: Secondary | ICD-10-CM

## 2016-11-27 MED ORDER — POTASSIUM CHLORIDE ER 10 MEQ PO TBCR: 10.0000 meq | EXTENDED_RELEASE_TABLET | Freq: Every day | ORAL | 0 refills | Status: DC

## 2016-11-27 NOTE — Progress Notes (Signed)
Sent kdur daily script due to low K+. Called and discussed the plan with her son.

## 2017-01-07 ENCOUNTER — Other Ambulatory Visit: Payer: Self-pay | Admitting: Internal Medicine

## 2017-01-15 ENCOUNTER — Encounter (HOSPITAL_COMMUNITY): Payer: Self-pay | Admitting: Emergency Medicine

## 2017-01-15 ENCOUNTER — Emergency Department (HOSPITAL_COMMUNITY): Payer: Medicare Other

## 2017-01-15 ENCOUNTER — Emergency Department (HOSPITAL_COMMUNITY)
Admission: EM | Admit: 2017-01-15 | Discharge: 2017-01-15 | Disposition: A | Discharge status: Home / Self Care | Payer: Medicare Other | Attending: Emergency Medicine | Admitting: Emergency Medicine

## 2017-01-15 DIAGNOSIS — N189 Chronic kidney disease, unspecified: Secondary | ICD-10-CM | POA: Diagnosis not present

## 2017-01-15 DIAGNOSIS — Z79899 Other long term (current) drug therapy: Secondary | ICD-10-CM | POA: Diagnosis not present

## 2017-01-15 DIAGNOSIS — I129 Hypertensive chronic kidney disease with stage 1 through stage 4 chronic kidney disease, or unspecified chronic kidney disease: Secondary | ICD-10-CM | POA: Insufficient documentation

## 2017-01-15 DIAGNOSIS — Z85038 Personal history of other malignant neoplasm of large intestine: Secondary | ICD-10-CM | POA: Insufficient documentation

## 2017-01-15 DIAGNOSIS — F0280 Dementia in other diseases classified elsewhere without behavioral disturbance: Secondary | ICD-10-CM | POA: Insufficient documentation

## 2017-01-15 DIAGNOSIS — G309 Alzheimer's disease, unspecified: Secondary | ICD-10-CM | POA: Insufficient documentation

## 2017-01-15 DIAGNOSIS — R4182 Altered mental status, unspecified: Secondary | ICD-10-CM | POA: Diagnosis present

## 2017-01-15 LAB — COMPREHENSIVE METABOLIC PANEL
ALT: 12 U/L — AB (ref 14–54)
AST: 20 U/L (ref 15–41)
Albumin: 3.8 g/dL (ref 3.5–5.0)
Alkaline Phosphatase: 82 U/L (ref 38–126)
Anion gap: 12 (ref 5–15)
BUN: 25 mg/dL — AB (ref 6–20)
CHLORIDE: 97 mmol/L — AB (ref 101–111)
CO2: 31 mmol/L (ref 22–32)
CREATININE: 1.32 mg/dL — AB (ref 0.44–1.00)
Calcium: 9.7 mg/dL (ref 8.9–10.3)
GFR calc Af Amer: 40 mL/min — ABNORMAL LOW (ref >60)
GFR, EST NON AFRICAN AMERICAN: 35 mL/min — AB (ref >60)
Glucose, Bld: 86 mg/dL (ref 65–99)
POTASSIUM: 3.5 mmol/L (ref 3.5–5.1)
SODIUM: 140 mmol/L (ref 135–145)
Total Bilirubin: 0.3 mg/dL (ref 0.3–1.2)
Total Protein: 6.8 g/dL (ref 6.5–8.1)

## 2017-01-15 LAB — CBC
HEMATOCRIT: 38.2 % (ref 36.0–46.0)
Hemoglobin: 12.6 g/dL (ref 12.0–15.0)
MCH: 29.9 pg (ref 26.0–34.0)
MCHC: 33 g/dL (ref 30.0–36.0)
MCV: 90.5 fL (ref 78.0–100.0)
PLATELETS: 193 10*3/uL (ref 150–400)
RBC: 4.22 MIL/uL (ref 3.87–5.11)
RDW: 15.2 % (ref 11.5–15.5)
WBC: 6 10*3/uL (ref 4.0–10.5)

## 2017-01-15 LAB — CBG MONITORING, ED: Glucose-Capillary: 79 mg/dL (ref 65–99)

## 2017-01-15 LAB — URINALYSIS, ROUTINE W REFLEX MICROSCOPIC
Bilirubin Urine: NEGATIVE
Glucose, UA: NEGATIVE mg/dL
Hgb urine dipstick: NEGATIVE
Ketones, ur: NEGATIVE mg/dL
LEUKOCYTES UA: NEGATIVE
Nitrite: NEGATIVE
PROTEIN: NEGATIVE mg/dL
SPECIFIC GRAVITY, URINE: 1.01 (ref 1.005–1.030)
pH: 7 (ref 5.0–8.0)

## 2017-01-15 NOTE — ED Notes (Signed)
CBG 79 

## 2017-01-15 NOTE — ED Notes (Signed)
Report called to [facility] 

## 2017-01-15 NOTE — ED Triage Notes (Signed)
Pt presents to ER [facility] for [facility]MS that was discovered this morning by staff; staff states that pt was normal when she went to bed last night and when they checked on her this morning she was lying in bed moaning (usually gets self up and dressed which she did not do this morning); EMS states that when they started PIV enroute, pt became agitated and combative

## 2017-01-15 NOTE — ED Provider Notes (Signed)
[facility]-EMERGENCY DEPT Provider Note   CSN: [id number] Arrival date & time: 01/15/17  4098     History   Chief Complaint Chief Complaint  Patient presents with  . Altered Mental Status    hx dementia     HPI [name] is a 83 y.o. female.  Pt presents to the ED this morning with altered mental status.  She has dementia and lives at [facility].  She is unable to give any history.  It is unclear what the alteration in her mental status was concerning to the facility.  Pt is awake in the room and has no complaints other than the left side of her neck is hurting.  Pt was agitated with EMS for starting an IV.      Past Medical History:  Diagnosis Date  . Adenomatous colon polyp   . Cholelithiasis 01/13/2015  . Constipation   . Diverticulosis of colon   . Elevated alkaline phosphatase level    Chronic; GGT was normal (30) on 09/18/2004.  . Endometrial polyp    S/P operative hysteroscopy with removal of polyps, dilatation and curettage on 10/20/2000 by Dr. Antionette Char.  . Esophageal stricture   . Gastric ulcer 1991   H. pylori positive in 1991; EGD 01/31/00 by Dr. Juanda Chance showed mild benign distal esophageal stricture (dilator passed), gastritis with evidence of coffee-ground material, and a 4 cm non-reducible hiatal hernia; EGD  02/09/2008 showed hiatal hernia, and mild benign distal espohageal stricture, was otherwise normal.  . Gout   . Hiatal hernia   . Hypertension   . Iron deficiency anemia   . Lower GI bleed   . Malignant neoplasm of colon 1991   S/P right hemicolectomy in 1991 by Dr. Jamey Ripa, reportedly done for a malignant polyp; colonoscopy by Dr. [name] 02/09/2008 showed  marked left colon diverticulosis with partial narrowing, S/P  remote right hemicolectomy.  . Osteopenia    DXA bone density scan on 10/16/2009 showed osteopenia with an AP spine T-score of -1.5, left femoral neck T-score of -1.7, and right femoral neck T-score of -2.0.  Her FRAX score  gave an estimated ten-year probability of major osteoporotic fracture of 7.3%, and a ten-year probability of hip fracture of 2.1%.  . Renal insufficiency   . Sensorineural hearing loss of both ears    Audiological evaluation on February 05, 2007 showed a slight to mild loss in both ears through 4000 Hz, with a moderate to severe precipitous drop in the highest frequencies (6000 to 8000 Hz).  . Tinnitus, bilateral    Audiological evaluation on February 05, 2007 showed a slight to mild loss of hearing in both ears through 4000 Hz, with a moderate to severe precipitous drop in the highest frequencies (6000 to 8000 Hz); her speech recognition abilities were within normal limits and a slightly elevated conversational level.  [name] Unsteady gait 08/07/2011  . Vitamin D deficiency    Vitamin D (25-Hydroxy) <10 ng/mL on 12/07/2009.    Patient Active Problem List   Diagnosis Date Noted  . Bilateral lower extremity edema 06/07/2016  . Healthcare maintenance 01/13/2015  . Cholelithiasis 01/13/2015  . Dementia of the Alzheimer's type 04/21/2013  . CONSTIPATION 10/15/2010  . Iron deficiency anemia 08/29/2010  . CKD (chronic kidney disease) 06/06/2010  . VITAMIN D DEFICIENCY 12/07/2009  . Osteopenia 12/06/2009  . FATIGUE 01/18/2009  . Gout 04/22/2007  . LOSS, SENSORINEURAL HEARING, BILAT 04/22/2007  . Essential hypertension 04/22/2007  . Diverticulosis of large intestine 04/22/2007  .  Alkaline phosphatase elevation 04/22/2007  . COLONIC POLYPS, ADENOMATOUS, HX OF 04/22/2007  . COLECTOMY, PARTIAL, WITH ANASTOMOSIS, HX OF 04/22/2007    Past Surgical History:  Procedure Laterality Date  . HEMICOLECTOMY  1991   Right -  with ileocolic anastamosis by Dr. Jamey Ripa  . HYSTEROSCOPY     S/P operative hysteroscopy with removal of polyps, dilatation and curettage on 10/20/2000 by Dr. Antionette Char.    OB History    Gravida Para Term Preterm AB Living   6 6 6     6    SAB TAB Ectopic Multiple Live Births                    Home Medications    Prior to Admission medications   Medication Sig Start Date End Date Taking? Authorizing Provider  allopurinol (ZYLOPRIM) 100 MG tablet Take 2 tablets (200 mg total) by mouth daily. 04/01/16  Yes Tasrif Ahmed, MD  alum & mag hydroxide-simeth (MAALOX/MYLANTA) 200-200-20 MG/5ML suspension Take 30 mLs by mouth every 6 (six) hours as needed for indigestion or heartburn.   Yes Historical Provider, MD  Calcium Carbonate-Vitamin D 600-400 MG-UNIT tablet Take 1 tablet by mouth 2 (two) times daily. 10/30/16  Yes Tasrif Ahmed, MD  colchicine 0.6 MG tablet Take 0.6 mg by mouth daily as needed (for gout).   Yes Historical Provider, MD  diltiazem (TIAZAC) 420 MG 24 hr capsule TAKE 1 CAPSULE (420 MG TOTAL) BY MOUTH DAILY. 09/03/16  Yes Burns Spain, MD  ferrous gluconate (FERGON) 225 (27 Fe) MG tablet TAKE 1 TABLET BY MOUTH TWICE A DAY WITH A MEAL 10/29/16  Yes Tasrif Ahmed, MD  furosemide (LASIX) 80 MG tablet Take 1 tablet (80 mg total) by mouth daily. 11/05/16 11/05/17 Yes Tasrif Ahmed, MD  guaifenesin (ROBITUSSIN) 100 MG/5ML syrup Take 200 mg by mouth 4 (four) times daily as needed for cough.   Yes Historical Provider, MD  loperamide (IMODIUM A-D) 2 MG tablet Take 2 mg by mouth 4 (four) times daily as needed for diarrhea or loose stools.   Yes Historical Provider, MD  losartan (COZAAR) 25 MG tablet Take 1 tablet (25 mg total) by mouth daily. 10/30/16  Yes Tasrif Ahmed, MD  magnesium hydroxide (MILK OF MAGNESIA) 400 MG/5ML suspension Take 30 mLs by mouth daily as needed for mild constipation.   Yes Historical Provider, MD  memantine (NAMENDA) 10 MG tablet Take 1 tablet (10 mg total) by mouth daily. 04/01/16  Yes Tasrif Ahmed, MD  neomycin-bacitracin-polymyxin (NEOSPORIN) [phone] ointment Apply 1 application topically as needed (until healed).   Yes Historical Provider, MD  rivastigmine (EXELON) 4.6 mg/24hr Place 1 patch (4.6 mg total) onto the skin daily. 04/01/16  Yes Tasrif  Ahmed, MD  ferrous sulfate 325 (65 FE) MG tablet Take 1 tablet (325 mg total) by mouth 2 (two) times daily with a meal. Patient not taking: Reported on 01/15/2017 10/30/16   Hyacinth Meeker, MD  KLOR-CON 10 10 MEQ tablet TAKE 1 TABLET BY MOUTH EVERY DAY Patient not taking: Reported on 01/15/2017 01/08/17   Hyacinth Meeker, MD    Family History Family History  Problem Relation Age of Onset  . Breast cancer Maternal Aunt   . Colon cancer Neg Hx     Social History Social History  Substance Use Topics  . Smoking status: Never Smoker  . Smokeless tobacco: Never Used  . Alcohol use No     Allergies   Patient has no known allergies.   Review of  Systems Review of Systems  Unable to perform ROS: Dementia     Physical Exam Updated Vital Signs BP 138/81   Pulse (!) 58   Temp 97.7 F (36.5 C) (Oral)   Resp 19   SpO2 100%   Physical Exam  Constitutional: She appears well-developed and well-nourished.  HENT:  Head: Normocephalic and atraumatic.  Right Ear: External ear normal.  Left Ear: External ear normal.  Nose: Nose normal.  Mouth/Throat: Oropharynx is clear and moist.  Eyes: EOM are normal. Pupils are equal, round, and reactive to light.  Neck: Normal range of motion. Neck supple.  Cardiovascular: Normal rate, regular rhythm, normal heart sounds and intact distal pulses.   Pulmonary/Chest: Effort normal and breath sounds normal.  Abdominal: Soft. Bowel sounds are normal.  Musculoskeletal: Normal range of motion.  Neurological: She is alert.  Pt is oriented to her name.  She knows her birthday is in [birth year].  She is following commands.  Skin: Skin is warm.  Psychiatric: She has a normal mood and affect. Her behavior is normal. Thought content normal.  Nursing note and vitals reviewed.    ED Treatments / Results  Labs (all labs ordered are listed, but only abnormal results are displayed) Labs Reviewed  COMPREHENSIVE METABOLIC PANEL - Abnormal; Notable for the following:         Result Value   Chloride 97 (*)    BUN 25 (*)    Creatinine, Ser 1.32 (*)    ALT 12 (*)    GFR calc non Af Amer 35 (*)    GFR calc Af Amer 40 (*)    All other components within normal limits  URINALYSIS, ROUTINE W REFLEX MICROSCOPIC - Abnormal; Notable for the following:    Color, Urine STRAW (*)    All other components within normal limits  CBC  CBG MONITORING, ED    EKG  EKG Interpretation  Date/Time:  Wednesday January 15 2017 07:21:44 EST Ventricular Rate:  55 PR Interval:    QRS Duration: 100 QT Interval:  477 QTC Calculation: 457 R Axis:   -46 Text Interpretation:  Sinus rhythm Probable left atrial enlargement Left anterior fascicular block Low voltage, precordial leads No significant change since last tracing Confirmed by [facility] LL[facility]VIL[facility] MD, Cassius Cullinane ([id number]) on 01/15/2017 7:25:45 AM       Radiology Dg Chest Port 1 View  Result Date: 01/15/2017 CLINICAL DATA:  Altered mental status. EXAM: PORTABLE CHEST 1 VIEW COMPARISON:  10/22/2015.  CT 06/06/2011. FINDINGS: Cardiomegaly with normal pulmonary vascularity. Low lung volumes. Mild left base infiltrate and small left pleural effusion. Large hiatal hernia again noted unchanged from prior CT of 06/06/2011. IMPRESSION: 1. Mild left base infiltrate with small left pleural effusion. 2. Cardiomegaly. 3. Large left hiatal hernia, unchanged from prior CT of 06/06/2011 . Electronically Signed   By: Maisie Fushomas  Register   On: 01/15/2017 07:35    Procedures Procedures (including critical care time)  Medications Ordered in ED Medications - No data to display   Initial Impression / Assessment and Plan / ED Course  I have reviewed the triage vital signs and the nursing notes.  Pertinent labs & imaging results that were available during my care of the patient were reviewed by me and considered in my medical decision making (see chart for details).    Compared to CT done on 11/16, she has chronic LLL atelectasis and blunting of the  costophrenic angle.  Pt has no signs of pneumonia, so I think the findings on the  CXR are old.  Pt has remained awake and alert while here.  Labs look reassuring.  Final Clinical Impressions(s) / ED Diagnoses   Final diagnoses:  Alzheimer's dementia without behavioral disturbance, unspecified timing of dementia onset    New Prescriptions New Prescriptions   No medications on file     Jacalyn Lefevre, MD 01/15/17 [phone]

## 2017-01-15 NOTE — ED Notes (Signed)
Pt lying in bed chanting prayers and singing quietly to self; NAD

## 2017-02-12 ENCOUNTER — Telehealth: Payer: Self-pay | Admitting: Internal Medicine

## 2017-02-12 NOTE — Telephone Encounter (Signed)
Patient son has question about medication

## 2017-02-12 NOTE — Telephone Encounter (Signed)
Called son, he would like triage to call facility in am and clarify furosemide dose due to the last mistake they made, will call in am and speak with med tech supervisor

## 2017-02-12 NOTE — Telephone Encounter (Signed)
This is a late documentation of a ED visit which happened on 01/15/2017. Patient was found unresponsive and went to the ED for evaluation. Per chart review, she had unremarkable lab work and returned back to her normal status quickly in the ED. No further recommendation at this time. We will follow her in clinic.

## 2017-02-13 NOTE — Telephone Encounter (Signed)
Called and spoke to 2 employees, confirmed furosemide dosage, had the person read it back to me.

## 2017-02-16 ENCOUNTER — Other Ambulatory Visit: Payer: Self-pay | Admitting: Internal Medicine

## 2017-02-16 DIAGNOSIS — R600 Localized edema: Secondary | ICD-10-CM

## 2017-02-19 NOTE — Telephone Encounter (Signed)
Patient should be on 80mg  daily. So 1 tablet of 80mg  daily is the right dose.

## 2017-02-19 NOTE — Telephone Encounter (Signed)
Needs clarification - 1 tab daily or one half tab daily. Rx written 11/05/16 for 1 tab.

## 2017-02-28 ENCOUNTER — Other Ambulatory Visit: Payer: Self-pay | Admitting: Internal Medicine

## 2017-02-28 DIAGNOSIS — M1000 Idiopathic gout, unspecified site: Secondary | ICD-10-CM

## 2017-04-18 ENCOUNTER — Telehealth: Payer: Self-pay | Admitting: Internal Medicine

## 2017-04-18 NOTE — Telephone Encounter (Signed)
APT. REMINDER CALL, LMTCB °

## 2017-04-21 ENCOUNTER — Encounter: Payer: Medicare Other | Admitting: Internal Medicine

## 2017-04-27 ENCOUNTER — Other Ambulatory Visit: Payer: Self-pay | Admitting: Internal Medicine

## 2017-04-27 DIAGNOSIS — R600 Localized edema: Secondary | ICD-10-CM

## 2017-04-27 DIAGNOSIS — F0280 Dementia in other diseases classified elsewhere without behavioral disturbance: Secondary | ICD-10-CM

## 2017-04-27 DIAGNOSIS — G309 Alzheimer's disease, unspecified: Principal | ICD-10-CM

## 2017-05-08 ENCOUNTER — Encounter: Payer: Self-pay | Admitting: *Deleted

## 2017-05-12 ENCOUNTER — Encounter: Payer: Medicare Other | Admitting: Internal Medicine

## 2017-05-13 ENCOUNTER — Telehealth: Payer: Self-pay | Admitting: *Deleted

## 2017-05-13 NOTE — Telephone Encounter (Signed)
It's fine to take the potassium 10meq daily as her K was ok on last lab check.  However, she needs to come in for follow up. When the people from her [facility], I requested them to make her an appt so we can follow up on her labs.

## 2017-05-13 NOTE — Telephone Encounter (Signed)
Elease Hashimotoatricia from the facility that pt resides at calls this am and states they need an order for K+, states the son brought a new bottle of KLOR-CON 10meq in yesterday, they state she has not been taking this medication, imc med list is a little confusing, statement on current order states discontinued but it remains on current medlist also pt's son was able to fill this at pharmacy, please advise

## 2017-05-15 NOTE — Telephone Encounter (Signed)
Spoke w/ facility and gave VO for K+, they actually had this order the "med tech" just didn't understand the different acceptable ways this order could have been given and entered, appt was scheduled

## 2017-05-15 NOTE — Telephone Encounter (Signed)
Called pt's son several times, no answer will continue to try, cancelled appt til can confirm with him

## 2017-05-20 ENCOUNTER — Other Ambulatory Visit: Payer: Self-pay

## 2017-05-20 DIAGNOSIS — G301 Alzheimer's disease with late onset: Principal | ICD-10-CM

## 2017-05-20 DIAGNOSIS — F0280 Dementia in other diseases classified elsewhere without behavioral disturbance: Secondary | ICD-10-CM

## 2017-05-20 NOTE — Telephone Encounter (Signed)
[name] from [facility] requesting memantine (NAMENDA) 10 MG tablet to be filled.

## 2017-05-22 ENCOUNTER — Other Ambulatory Visit: Payer: Self-pay | Admitting: *Deleted

## 2017-05-22 DIAGNOSIS — F0280 Dementia in other diseases classified elsewhere without behavioral disturbance: Secondary | ICD-10-CM

## 2017-05-22 DIAGNOSIS — G301 Alzheimer's disease with late onset: Principal | ICD-10-CM

## 2017-05-25 MED ORDER — CALCIUM CARBONATE-VITAMIN D 600-400 MG-UNIT PO TABS: 1.0000 | ORAL_TABLET | Freq: Two times a day (BID) | ORAL | 1 refills | Status: DC

## 2017-05-25 MED ORDER — MEMANTINE HCL 10 MG PO TABS: 10.0000 mg | ORAL_TABLET | Freq: Every day | ORAL | 3 refills | Status: DC

## 2017-05-25 MED ORDER — LOSARTAN POTASSIUM 25 MG PO TABS: 25.0000 mg | ORAL_TABLET | Freq: Every day | ORAL | 1 refills | Status: DC

## 2017-05-25 MED ORDER — MEMANTINE HCL 10 MG PO TABS: 10.0000 mg | ORAL_TABLET | Freq: Every day | ORAL | 1 refills | Status: DC

## 2017-05-27 NOTE — Telephone Encounter (Signed)
Cannot reach son by phone

## 2017-06-05 ENCOUNTER — Other Ambulatory Visit: Payer: Self-pay | Admitting: Internal Medicine

## 2017-06-05 DIAGNOSIS — F0280 Dementia in other diseases classified elsewhere without behavioral disturbance: Secondary | ICD-10-CM

## 2017-06-05 DIAGNOSIS — G309 Alzheimer's disease, unspecified: Principal | ICD-10-CM

## 2017-06-10 ENCOUNTER — Telehealth: Payer: Self-pay | Admitting: Internal Medicine

## 2017-06-10 NOTE — Telephone Encounter (Signed)
Rec'd [facility] TAD HOSE due to calf swelling.  Patient has not been seen since November of 2017 Unable to provide order without patient being seen as patient does not see their ON CALL DR but, sees our office instead.  Patient scheduled to se Dr. Saunders RevelNedrud on 06/11/2017.

## 2017-06-11 ENCOUNTER — Ambulatory Visit (INDEPENDENT_AMBULATORY_CARE_PROVIDER_SITE_OTHER): Payer: Medicare Other | Admitting: Internal Medicine

## 2017-06-11 ENCOUNTER — Encounter: Payer: Self-pay | Admitting: Internal Medicine

## 2017-06-11 VITALS — BP 111/53 | HR 59 | Temp 97.6°F | Ht <= 58 in | Wt 145.6 lb

## 2017-06-11 DIAGNOSIS — R600 Localized edema: Secondary | ICD-10-CM | POA: Diagnosis not present

## 2017-06-11 DIAGNOSIS — I10 Essential (primary) hypertension: Secondary | ICD-10-CM | POA: Diagnosis present

## 2017-06-11 DIAGNOSIS — Z79899 Other long term (current) drug therapy: Secondary | ICD-10-CM

## 2017-06-11 NOTE — Progress Notes (Signed)
CC: Leg swelling and HTN follow up  HPI:  MeghanMeghan Duncan is a 83 y.o. with a PMH of CKD, Alzheimer's dementia, and HTN who is presenting today for acute onset of leg swelling and HTN follow up. The patient is here with her son, who is her primary caregiver and assists with the interview. The patient was last seen in clinic on 10/21/2016 for management of her chronic medical problems. Since her last visit the patient has felt well. Her only complaint is bilateral leg swelling, which she reports at baseline but has gotten worse in the past few weeks. She lives in a nursing home right now and her son reports difficulty with getting her patient to wear compression stockings each day. He reports that he was told the compressions stockings she has worn in the past are too small and the patient needs to be re-measured.   Past Medical History:  Diagnosis Date  . Adenomatous colon polyp   . Cholelithiasis 01/13/2015  . Constipation   . Diverticulosis of colon   . Elevated alkaline phosphatase level    Chronic; GGT was normal (30) on 09/18/2004.  . Endometrial polyp    S/P operative hysteroscopy with removal of polyps, dilatation and curettage on 10/20/2000 by Dr. Antionette CharLisa Jackson-Moore.  . Esophageal stricture   . Gastric ulcer 1991   H. pylori positive in 1991; EGD 01/31/00 by Dr. Juanda ChanceBrodie showed mild benign distal esophageal stricture (dilator passed), gastritis with evidence of coffee-ground material, and a 4 cm non-reducible hiatal hernia; EGD  02/09/2008 showed hiatal hernia, and mild benign distal espohageal stricture, was otherwise normal.  . Gout   . Hiatal hernia   . Hypertension   . Iron deficiency anemia   . Lower GI bleed   . Malignant neoplasm of colon 1991   S/P right hemicolectomy in 1991 by Dr. Jamey RipaStreck, reportedly done for a malignant polyp; colonoscopy by Dr. Lina Sarora Brodie 02/09/2008 showed  marked left colon diverticulosis with partial narrowing, S/P  remote right hemicolectomy.  .  Osteopenia    DXA bone density scan on 10/16/2009 showed osteopenia with an AP spine T-score of -1.5, left femoral neck T-score of -1.7, and right femoral neck T-score of -2.0.  Her FRAX score gave an estimated ten-year probability of major osteoporotic fracture of 7.3%, and a ten-year probability of hip fracture of 2.1%.  . Renal insufficiency   . Sensorineural hearing loss of both ears    Audiological evaluation on February 05, 2007 showed a slight to mild loss in both ears through 4000 Hz, with a moderate to severe precipitous drop in the highest frequencies (6000 to 8000 Hz).  . Tinnitus, bilateral    Audiological evaluation on February 05, 2007 showed a slight to mild loss of hearing in both ears through 4000 Hz, with a moderate to severe precipitous drop in the highest frequencies (6000 to 8000 Hz); her speech recognition abilities were within normal limits and a slightly elevated conversational level.  [name]. Unsteady gait 08/07/2011  . Vitamin D deficiency    Vitamin D (25-Hydroxy) <10 ng/mL on 12/07/2009.   Review of Systems:   ROS unable to be performed because patient has advanced dementia.   Physical Exam:  Vitals:   06/11/17 1317  BP: (!) 111/53  Pulse: (!) 59  Temp: 97.6 F (36.4 C)  TempSrc: Oral  SpO2: 97%  Weight: 145 lb 9.6 oz (66 kg)  Height: 4\' 9"  (1.448 m)   Physical Exam  Cardiovascular: Normal rate, regular rhythm  and normal heart sounds.  Exam reveals no friction rub.   No murmur heard. 2+ pitting edema to knees bilaterally. Mild tenderness to palpation of lower extremities bilaterally.  Pulmonary/Chest: Effort normal and breath sounds normal. No respiratory distress. She has no wheezes.  No crackles appreciated  Abdominal: Soft. Bowel sounds are normal. She exhibits no distension. There is no tenderness.  Musculoskeletal:  Dry skin on lower extremities without erythema or ulceration.  Neurological:  Patient not oriented to place, year, or month. Pleasant affect and  cooperative with exam.   Skin: Skin is warm and dry. Capillary refill takes less than 2 seconds.     Assessment & Plan:   See Encounters Tab for problem based charting.  Patient seen with Dr. Heide Spark.

## 2017-06-11 NOTE — Assessment & Plan Note (Signed)
Patient's BP is at goal. Continue current regimen of BP medications.   The patient will take double her normal furosemide dose for the next week to assist with the volume overload observed on PE today. She was told to stop taking furosemide should she have symptoms of hypotension including headaches and dizziness.

## 2017-06-11 NOTE — Patient Instructions (Addendum)
Thank you for seeing us today.  You have fluid retention in your legs. To prevent leg swelling, you must wear compression stockings each day. Please tell the nursing home staff to help you place these stockings each day. Please elevate your feet when you are laying down at night.   You need to START taking two 80 mg Furosemide tablets each day. You can take one in the morning and one at night. You will do this for 1 week. After 1 week you will go back to taking one 80 mg Furosemide tablet per day.  Please return to clinic to see me (Dr. Jeanella FlatteryMarybeth Karma Ansley) in 2 weeks so that I can reassess your leg swelling and blood pressure.

## 2017-06-11 NOTE — Assessment & Plan Note (Signed)
The patient was volume overloaded on PE today. This acute worsening of her chronic lower extremity edema is likely due to fluid retention. The patient will double her dose of furosemide for 1 week. Her son was instructed to tell the nursing home to have her take two tablets (total of 160 mg) per day for 7 days and then to return to her usual regimen of one 80 mg tablet daily.   The patient was also instructed about the importance of wearing compression stockings that fit her correctly. She was measured for stockings in clinic today and was instructed to obtain these as soon as possible. She should wear these stockings every day.   She was told to follow up in 2 weeks with me to reassess her BP and leg swelling.

## 2017-06-12 NOTE — Progress Notes (Signed)
Internal Medicine Clinic Attending  I saw and evaluated the patient.  I personally confirmed the key portions of the history and exam documented by Dr. Nedrud and I reviewed pertinent patient test results.  The assessment, diagnosis, and plan were formulated together and I agree with the documentation in the resident's note.  

## 2017-06-25 ENCOUNTER — Ambulatory Visit (INDEPENDENT_AMBULATORY_CARE_PROVIDER_SITE_OTHER): Payer: Medicare Other | Admitting: Internal Medicine

## 2017-06-25 ENCOUNTER — Encounter: Payer: Self-pay | Admitting: Internal Medicine

## 2017-06-25 VITALS — BP 130/82 | HR 73 | Ht <= 58 in | Wt 141.6 lb

## 2017-06-25 DIAGNOSIS — I129 Hypertensive chronic kidney disease with stage 1 through stage 4 chronic kidney disease, or unspecified chronic kidney disease: Secondary | ICD-10-CM | POA: Diagnosis not present

## 2017-06-25 DIAGNOSIS — N189 Chronic kidney disease, unspecified: Secondary | ICD-10-CM | POA: Diagnosis not present

## 2017-06-25 DIAGNOSIS — Z79899 Other long term (current) drug therapy: Secondary | ICD-10-CM | POA: Diagnosis not present

## 2017-06-25 DIAGNOSIS — F0280 Dementia in other diseases classified elsewhere without behavioral disturbance: Secondary | ICD-10-CM | POA: Diagnosis not present

## 2017-06-25 DIAGNOSIS — I10 Essential (primary) hypertension: Secondary | ICD-10-CM

## 2017-06-25 DIAGNOSIS — R600 Localized edema: Secondary | ICD-10-CM | POA: Diagnosis present

## 2017-06-25 DIAGNOSIS — G309 Alzheimer's disease, unspecified: Secondary | ICD-10-CM | POA: Diagnosis not present

## 2017-06-25 MED ORDER — ALUM & MAG HYDROXIDE-SIMETH 200-200-20 MG/5ML PO SUSP: 30.0000 mL | Freq: Four times a day (QID) | ORAL | Status: DC | PRN

## 2017-06-25 MED ORDER — FUROSEMIDE 80 MG PO TABS: 80.0000 mg | ORAL_TABLET | Freq: Two times a day (BID) | ORAL | 1 refills | Status: AC

## 2017-06-25 NOTE — Progress Notes (Signed)
CC: Lower extremity swelling and HTN follow up.  HPI:  [name] is a 83 y.o. with a PMH of Alzheimer's dementia, CKD, colon cancer s/p right hemicolectomy, and HTN who is presenting today for follow up of acute onset bilateral lower extremity swelling and HTN. She was last seen in clinic on 06/11/2017 at which time she was instructed to increase her dose of Lasix to 80 mg BID for 1 week and was re-measured for compression stockings.   The patient lives in a long term care facility [name]([place]) and is accompanied by her son, who is her main caregiver outside of the facility to today's visit. The HPI was obtained with assistance from her son, as the patient has advanced Alzheimer's disease.  Since her last visit the patient has felt well and has no acute complaints. The patient's son has noticed that her lower extremity swelling has improved since her last visit. She has not yet received her compression stockings that were ordered after her last visit and requests new stockings today.  Past Medical History:  Diagnosis Date  . Cholelithiasis 01/13/2015  . CKD (chronic kidney disease)   . Diverticulosis of colon   . Elevated alkaline phosphatase level    Chronic; GGT was normal (30) on 09/18/2004.  . Endometrial polyp    S/P operative hysteroscopy with removal of polyps, dilatation and curettage on 10/20/2000 by Dr. Antionette CharLisa Jackson-Moore.  . Esophageal stricture   . Gastric ulcer 1991   H. pylori positive in 1991; EGD 01/31/00 by Dr. Juanda ChanceBrodie showed mild benign distal esophageal stricture (dilator passed), gastritis with evidence of coffee-ground material, and a 4 cm non-reducible hiatal hernia; EGD  02/09/2008 showed hiatal hernia, and mild benign distal espohageal stricture, was otherwise normal.  . Gout   . Hiatal hernia   . Hypertension   . Iron deficiency anemia   . Lower GI bleed   . Malignant neoplasm of colon 1991   S/P right hemicolectomy in 1991 by Dr. Jamey RipaStreck, reportedly  done for a malignant polyp; colonoscopy by Dr. Lina Sarora Brodie 02/09/2008 showed  marked left colon diverticulosis with partial narrowing, S/P  remote right hemicolectomy.  . Osteopenia    DXA bone density scan on 10/16/2009 showed osteopenia with an AP spine T-score of -1.5, left femoral neck T-score of -1.7, and right femoral neck T-score of -2.0.  Her FRAX score gave an estimated ten-year probability of major osteoporotic fracture of 7.3%, and a ten-year probability of hip fracture of 2.1%.  . Sensorineural hearing loss of both ears    Audiological evaluation on February 05, 2007 showed a slight to mild loss in both ears through 4000 Hz, with a moderate to severe precipitous drop in the highest frequencies (6000 to 8000 Hz).  . Tinnitus, bilateral    Audiological evaluation on February 05, 2007 showed a slight to mild loss of hearing in both ears through 4000 Hz, with a moderate to severe precipitous drop in the highest frequencies (6000 to 8000 Hz); her speech recognition abilities were within normal limits and a slightly elevated conversational level.  [name]. Unsteady gait 08/07/2011  . Vitamin D deficiency    Vitamin D (25-Hydroxy) <10 ng/mL on 12/07/2009.   Review of Systems:   Limited secondary to patient's Alzheimer's dementia. Per son's report the patient has not complained of chest pain, abdominal pain, change in bowel movements or pain with urination.  Physical Exam:  Vitals:   06/25/17 0955  BP: 130/82  Pulse: 73  SpO2: 97%  Weight: 141 lb 9.6 oz (64.2 kg)  Height: 4\' 9"  (1.448 m)   Physical Exam  Constitutional: She appears well-developed and well-nourished.  Pleasant elderly woman sitting comfortably in wheelchair in no acute distress.  Cardiovascular: Normal rate, regular rhythm, normal heart sounds and intact distal pulses.  Exam reveals no friction rub.   No murmur heard. Pulmonary/Chest: Effort normal and breath sounds normal. No respiratory distress. She has no wheezes.  No crackles  appreciated.  Abdominal: Soft. She exhibits no distension. There is no tenderness.  Musculoskeletal: She exhibits tenderness (with light palpation of lower extremities). She exhibits no edema (1+ pitting edema to knees bilaterally).  Neurological:  Patient oriented to person, but not to place, time, or interviewer. She is cooperative with exam and can easily follow instructions.  Skin: Skin is warm and dry. Capillary refill takes 2 to 3 seconds. No rash noted. No erythema.    Assessment & Plan:   See Encounters Tab for problem based charting.  Patient seen with Dr. Cyndie ChimeGranfortuna.

## 2017-06-25 NOTE — Assessment & Plan Note (Addendum)
Patient's BP at goal of <140/90 today. The patient's acute increase in BP at last visit was likely 2/2 volume overload. The patient was told to continue 80 mg PO Lasix and asked to follow up in 2-4 weeks for BMP check. Reassess this problem at next visit.  Plan: -Continue Lasix 80 mg, BID -Continue Klor-Con daily -Continue losartan 10 mg daily and diltiazem 420 mg daily -BMP at next visit

## 2017-06-25 NOTE — Patient Instructions (Addendum)
Thank you for seeing us today.  Please continue to take 2 tablets of your furosemide, 80 mg daily. Please continue to wear compression stockings each day and elevate feet at night.  Please return to clinic in 2-4 weeks for blood work so we can check your kidney function and electrolyte levels. Your provider will talk to you about these results and her Lasix dosing.

## 2017-06-25 NOTE — Assessment & Plan Note (Addendum)
The patient's lower extremity edema is much improved from last visit, however she still has 1+ pitting edema on exam. The patient is still taking 80 mg tablets twice a day. She has tolerated this regimen well. Her BP today is at goal today with new regimen, however her weight is still elevated above historical recordings The patient was given compression stockings in the clinic today and told to wear these daily. She was instructed to continue her current regimen of 80 mg PO Lasix BID. She was advised to get blood work today, to reassess kidney function and potassium levels with additional lasix. Her son had to get to another appointment and did not want to proceed with blood work today. At follow up appointment a BMP should be obtained to assess Cr levels and K levels. Also at follow up consider decreasing lasix to 60 mg BID - the son was reluctant to do this today because he did not want to pick up a new prescription from pharmacy and wanted to finish the medication she has at home.

## 2017-06-25 NOTE — Progress Notes (Signed)
Medicine attending: I personally interviewed and briefly examined this patient on the day of the patient visit and reviewed pertinent clinical ,laboratory data  with resident physician Dr. Rozann LeschesMarybeth Nedrud and we discussed a management plan. Lady w advanced dementia; CKD 3, venous stasis with 4+ peripheral edema, norma LVEF, grade 1 diastolic dysfunction. Recent increase in lasix from 80 mg QD to 80 mg BID with good result. Still with 1+ edema but this is OK. We should be able to go back down on diuretic dose to avoid dehydration and hypokalemia. Son present - manages her meds. Only has 80 mg tabs now. He has an MD appt & has to leave so pt can not get labs today. We will continue current dose of lasix but have short term follow up visit to check labs and likely decrease diuretic dose.

## 2017-07-21 ENCOUNTER — Telehealth: Payer: Self-pay | Admitting: Internal Medicine

## 2017-07-21 NOTE — Telephone Encounter (Signed)
Rec'd call from [facility]) requesting an updated order for a different type of compression hose.  Also rec'd call from patient's caregiver Jelani Hlavaty [facility]) stating, "He would like to know if the compression hose stocking ordered should be below the knee or above the knee. Lela to call patient to verify Compression Stockings were rec'd that was ordered has well.  Please Advise if you would like for The Facility([place] needs to send over a new request for a different Stocking).

## 2017-07-23 ENCOUNTER — Ambulatory Visit (INDEPENDENT_AMBULATORY_CARE_PROVIDER_SITE_OTHER): Payer: Medicare Other | Admitting: Internal Medicine

## 2017-07-23 VITALS — BP 123/79 | HR 54 | Ht <= 58 in | Wt 147.1 lb

## 2017-07-23 DIAGNOSIS — Z79899 Other long term (current) drug therapy: Secondary | ICD-10-CM

## 2017-07-23 DIAGNOSIS — R600 Localized edema: Secondary | ICD-10-CM | POA: Diagnosis present

## 2017-07-23 NOTE — Progress Notes (Signed)
   CC: Bilateral lower extremity edema follow up   HPI:  [name] is a 83 y.o. female with PMH listed below who presents to clinic for follow-up of bilateral lower extremity edema. Please see problem based assessment and plan for further details.  Past Medical History:  Diagnosis Date  . Cholelithiasis 01/13/2015  . CKD (chronic kidney disease)   . Diverticulosis of colon   . Elevated alkaline phosphatase level    Chronic; GGT was normal (30) on 09/18/2004.  . Endometrial polyp    S/P operative hysteroscopy with removal of polyps, dilatation and curettage on 10/20/2000 by Dr. Antionette Char.  . Esophageal stricture   . Gastric ulcer 1991   H. pylori positive in 1991; EGD 01/31/00 by Dr. Juanda Chance showed mild benign distal esophageal stricture (dilator passed), gastritis with evidence of coffee-ground material, and a 4 cm non-reducible hiatal hernia; EGD  02/09/2008 showed hiatal hernia, and mild benign distal espohageal stricture, was otherwise normal.  . Gout   . Hiatal hernia   . Hypertension   . Iron deficiency anemia   . Lower GI bleed   . Malignant neoplasm of colon 1991   S/P right hemicolectomy in 1991 by Dr. Jamey Ripa, reportedly done for a malignant polyp; colonoscopy by Dr. [name] 02/09/2008 showed  marked left colon diverticulosis with partial narrowing, S/P  remote right hemicolectomy.  . Osteopenia    DXA bone density scan on 10/16/2009 showed osteopenia with an AP spine T-score of -1.5, left femoral neck T-score of -1.7, and right femoral neck T-score of -2.0.  Her FRAX score gave an estimated ten-year probability of major osteoporotic fracture of 7.3%, and a ten-year probability of hip fracture of 2.1%.  . Sensorineural hearing loss of both ears    Audiological evaluation on February 05, 2007 showed a slight to mild loss in both ears through 4000 Hz, with a moderate to severe precipitous drop in the highest frequencies (6000 to 8000 Hz).  . Tinnitus, bilateral    Audiological evaluation on February 05, 2007 showed a slight to mild loss of hearing in both ears through 4000 Hz, with a moderate to severe precipitous drop in the highest frequencies (6000 to 8000 Hz); her speech recognition abilities were within normal limits and a slightly elevated conversational level.  [name] Unsteady gait 08/07/2011  . Vitamin D deficiency    Vitamin D (25-Hydroxy) <10 ng/mL on 12/07/2009.   Review of Systems:   Review of Systems  Constitutional: Negative for chills, fever and weight loss.  Respiratory: Negative for cough, shortness of breath and wheezing.   Cardiovascular: Positive for leg swelling. Negative for chest pain and palpitations.    Physical Exam:  Vitals:   07/23/17 1541  BP: 123/79  Pulse: (!) 54  SpO2: 100%  Weight: 147 lb 1.6 oz (66.7 kg)  Height: 4\' 9"  (1.448 m)   General: pleasant, Well-developed in no acute distress Cardiac: Bradycardic and regular rhythm, nl S1/S2, no murmurs, rubs or gallops  Pulm: CTAB, no wheezes or crackles, no increased work of breathing  Abd: soft, NTND, bowel sounds present  Ext: Patient wearing compression stockings during visit, difficult to assess lower extremity edema    Assessment & Plan:   See Encounters Tab for problem based charting.  Patient seen with Dr. Criselda Peaches

## 2017-07-23 NOTE — Patient Instructions (Signed)
It was nice to meet you today, [name].  We will continue your Lasix 80 mg 2 times a day.   I will call you if results results from your blood tests are not normal or if we need to make changes in your Lasix dose.

## 2017-07-23 NOTE — Assessment & Plan Note (Addendum)
Patient presenting for follow-up of bilateral lower extremity edema. Currently lives at a nursing facility. She is accompanied by her son today who is her caretaker. She was last seen on 7/25 at which time she was kept on Lasix 80 mg BID for significant edema on exam. She was also provided with compression stockings, which she was measured for. Per son, lower extremity swelling is significantly decreased. She has been compliant with her Lasix.   - BMP. Will call patient if adjustments in Lasix dose needed. - Continue Lasix 80 mg BID. If decreased renal function, will decrease Lasix to 80 mg daily. Will plan to decrease Lasix to 60 mg BID once patient runs out of 80 mg tablets.  8/23: BMP with Cr 1.66 from 1.32 on 01/2017. Will decrease Lasix to 80mg  daily as planned. Patient's son aware. Called [facility] (patient's nursing home) to inform them of change in treatment. Will send written order on 8/24.

## 2017-07-23 NOTE — Telephone Encounter (Signed)
I believe at the visit on 06/11/17 she was measured by Lela for stockings that are below the knee. These will be sufficient to help with the patient's chronic bilateral lower extremity edema. If she was measured for ones that go above the knee on 06/11/17, that is fine with me as well. I want to make sure that [facility] provides the patient with the stockings that Lela properly measured her for, as Lela worked hard to make sure the patient will be comfortable in the correct compression stockings.  The issue at the 06/25/17 visit (and at previous visits) was that [facility] never provided her with the proper stockings we ordered for her for on 06/11/17. I believe Lela worked hard on 06/25/17 to physically obtain the proper stokings before she returned to [facility]. It sounds like Lela is still working hard on the patient's behalf regarding this issue and I appreciate it! Whatever stockings Lela gave her on 06/25/17 (I'm not sure which ones were given) are the ones that I would recommend she have moving forward.   Does this help? Let me know if I have to sign something!

## 2017-07-24 LAB — BMP8+ANION GAP
ANION GAP: 18 mmol/L (ref 10.0–18.0)
BUN/Creatinine Ratio: 26 (ref 12–28)
BUN: 43 mg/dL — AB (ref 8–27)
CALCIUM: 9.6 mg/dL (ref 8.7–10.3)
CHLORIDE: 99 mmol/L (ref 96–106)
CO2: 25 mmol/L (ref 20–29)
Creatinine, Ser: 1.66 mg/dL — ABNORMAL HIGH (ref 0.57–1.00)
GFR calc non Af Amer: 27 mL/min/{1.73_m2} — ABNORMAL LOW (ref >59)
GFR, EST AFRICAN AMERICAN: 31 mL/min/{1.73_m2} — AB (ref >59)
GLUCOSE: 120 mg/dL — AB (ref 65–99)
Potassium: 4.5 mmol/L (ref 3.5–5.2)
Sodium: 142 mmol/L (ref 134–144)

## 2017-07-29 NOTE — Progress Notes (Signed)
Internal Medicine Clinic Attending  I saw and evaluated the patient.  I personally confirmed the key portions of the history and exam documented by Dr. Santos-Sanchez and I reviewed pertinent patient test results.  The assessment, diagnosis, and plan were formulated together and I agree with the documentation in the resident's note. 

## 2017-07-30 ENCOUNTER — Other Ambulatory Visit: Payer: Self-pay | Admitting: *Deleted

## 2017-07-30 MED ORDER — POTASSIUM CHLORIDE ER 10 MEQ PO TBCR
10.0000 meq | EXTENDED_RELEASE_TABLET | Freq: Every day | ORAL | 1 refills | Status: AC
Start: 2017-07-30 — End: ?

## 2017-08-06 ENCOUNTER — Ambulatory Visit (INDEPENDENT_AMBULATORY_CARE_PROVIDER_SITE_OTHER): Payer: Medicare Other | Admitting: Internal Medicine

## 2017-08-06 ENCOUNTER — Encounter: Payer: Self-pay | Admitting: Internal Medicine

## 2017-08-06 ENCOUNTER — Telehealth: Payer: Self-pay | Admitting: Internal Medicine

## 2017-08-06 DIAGNOSIS — N189 Chronic kidney disease, unspecified: Secondary | ICD-10-CM | POA: Diagnosis not present

## 2017-08-06 DIAGNOSIS — L720 Epidermal cyst: Secondary | ICD-10-CM

## 2017-08-06 DIAGNOSIS — I129 Hypertensive chronic kidney disease with stage 1 through stage 4 chronic kidney disease, or unspecified chronic kidney disease: Secondary | ICD-10-CM | POA: Diagnosis not present

## 2017-08-06 MED ORDER — DOXYCYCLINE HYCLATE 50 MG PO CAPS: 100.0000 mg | ORAL_CAPSULE | Freq: Two times a day (BID) | ORAL | 0 refills | Status: DC

## 2017-08-06 NOTE — Progress Notes (Signed)
CC: boil on left shoulder  HPI:  [name] is a 83 y.o. with past medical history documented as below presenting for a boil on the area of her left shoulder. She is accompanied by her son that provided most the history. Per the patient's son, he was notified about the swelling/mass on her left shoulder five days ago from the facility the patient lives in. The facility requested he take the patient to get the area evaluated by a doctor. He does not know when it first appeared, but believes it has gotten larger and more read over the past few days. He has not noticed any oozing or drainage. He does not believe it bothers the patient but has noticed her scratching at it. Patient denies any fever, chills, nausea, or vomiting. She has otherwise been in her normal state of health.   Past Medical History:  Diagnosis Date  . Cholelithiasis 01/13/2015  . CKD (chronic kidney disease)   . Diverticulosis of colon   . Elevated alkaline phosphatase level    Chronic; GGT was normal (30) on 09/18/2004.  . Endometrial polyp    S/P operative hysteroscopy with removal of polyps, dilatation and curettage on 10/20/2000 by Dr. Antionette CharLisa Jackson-Moore.  . Esophageal stricture   . Gastric ulcer 1991   H. pylori positive in 1991; EGD 01/31/00 by Dr. Juanda ChanceBrodie showed mild benign distal esophageal stricture (dilator passed), gastritis with evidence of coffee-ground material, and a 4 cm non-reducible hiatal hernia; EGD  02/09/2008 showed hiatal hernia, and mild benign distal espohageal stricture, was otherwise normal.  . Gout   . Hiatal hernia   . Hypertension   . Iron deficiency anemia   . Lower GI bleed   . Malignant neoplasm of colon 1991   S/P right hemicolectomy in 1991 by Dr. Jamey RipaStreck, reportedly done for a malignant polyp; colonoscopy by Dr. Lina Sarora Brodie 02/09/2008 showed  marked left colon diverticulosis with partial narrowing, S/P  remote right hemicolectomy.  . Osteopenia    DXA bone density scan on 10/16/2009  showed osteopenia with an AP spine T-score of -1.5, left femoral neck T-score of -1.7, and right femoral neck T-score of -2.0.  Her FRAX score gave an estimated ten-year probability of major osteoporotic fracture of 7.3%, and a ten-year probability of hip fracture of 2.1%.  . Sensorineural hearing loss of both ears    Audiological evaluation on February 05, 2007 showed a slight to mild loss in both ears through 4000 Hz, with a moderate to severe precipitous drop in the highest frequencies (6000 to 8000 Hz).  . Tinnitus, bilateral    Audiological evaluation on February 05, 2007 showed a slight to mild loss of hearing in both ears through 4000 Hz, with a moderate to severe precipitous drop in the highest frequencies (6000 to 8000 Hz); her speech recognition abilities were within normal limits and a slightly elevated conversational level.  [name]. Unsteady gait 08/07/2011  . Vitamin D deficiency    Vitamin D (25-Hydroxy) <10 ng/mL on 12/07/2009.   Review of Systems:   Review of Systems  Constitutional: Negative for chills, fever and malaise/fatigue.  Cardiovascular: Negative.   Gastrointestinal: Negative.   Musculoskeletal: Negative.     Physical Exam:  Vitals:   08/06/17 0922  BP: (!) 143/97  Pulse: 88  Temp: 98.1 F (36.7 C)  TempSrc: Oral  SpO2: 100%  Weight: 145 lb 14.4 oz (66.2 kg)  Height: 4\' 9"  (1.448 m)   Physical Exam  Constitutional: She is oriented to  person, place, and time. She appears well-developed and well-nourished. No distress.  HENT:  Head: Normocephalic and atraumatic.  Eyes: Conjunctivae and EOM are normal.  Neck: Normal range of motion. Neck supple.  Cardiovascular: Normal rate, regular rhythm and normal heart sounds.   Pulmonary/Chest: Effort normal and breath sounds normal. No respiratory distress.  Abdominal: Soft. Bowel sounds are normal. She exhibits no distension. There is no tenderness.  Musculoskeletal: She exhibits edema.  Neurological: She is alert and oriented to  person, place, and time.  Skin:  Left scapular region has a 3.5 inch hard, nonfluctuant mass with surrounding erythema and warmth that extends 1-2 inches circumferentially around the mass. The center of the mass has a dry,  Eschar/scab. No purulent drainage or oozing.     Assessment & Plan:   See Encounters Tab for problem based charting.  Patient seen with Dr. Heide Spark

## 2017-08-06 NOTE — Addendum Note (Signed)
Addended by: Toney RakesLACROCE, Lasheba Stevens J on: 08/06/2017 02:23 PM   Modules accepted: Level of Service

## 2017-08-06 NOTE — Progress Notes (Signed)
Internal Medicine Clinic Attending  I saw and evaluated the patient.  I personally confirmed the key portions of the history and exam documented by Dr. LaCroce and I reviewed pertinent patient test results.  The assessment, diagnosis, and plan were formulated together and I agree with the documentation in the resident's note.  

## 2017-08-06 NOTE — Assessment & Plan Note (Addendum)
Assessment:  On physical exam the area was hard, tender to palpation with no fluctuance, oozing or drainage, with associated surrounding erythema and warmth. Patient's vital signs were within normal limits and not concerning for systemic infection. POC U/S was performed and did not show any fluid or fluid pockets that could be drained, indicating that this is less likely a skin abscess.  This mass is most consistent with epidermoid cyst complicated with overlying cellulitis.   Plan:  -Doxycycline 100 mg BID for seven days  -If not improved with antibiotics, may require further evaluation

## 2017-08-06 NOTE — Telephone Encounter (Signed)
ALEXANDER Antwine CALLED TO SPEAK WITH A NURSE ABOUT GeraldMARY Purnell, [phone]

## 2017-08-06 NOTE — Addendum Note (Signed)
Addended by: Toney RakesLACROCE, Kailana Benninger J on: 08/06/2017 04:26 PM   Modules accepted: Orders

## 2017-08-06 NOTE — Telephone Encounter (Signed)
Received call from [facility] stating that pt was seen in our office today and given a rx for an antibiotic.  Pt's son had filled the rx at an local pharamacy and [facility].  Per Estanislado EmmsShaketa, the correct protocol would have been for the family to bring the rx to the facility and they will have it filled.  They asked that I fax over a new rx and they would make sure pt start medication tonight.Kingsley SpittleGoldston, [name]9/5/20185:09 PM

## 2017-08-06 NOTE — Patient Instructions (Signed)
[name],   Thank you for visiting the [facility] Medicine Center.   You have a soft tissue and skin infection. You have been prescribed an antibiotic called Doxycycline. Please take 100 mg of Doxycycline twice daily for seven days.   If your infection does not improve please return to clinic in one to two weeks.

## 2017-08-07 ENCOUNTER — Other Ambulatory Visit: Payer: Self-pay | Admitting: Internal Medicine

## 2017-08-07 ENCOUNTER — Telehealth: Payer: Self-pay | Admitting: Internal Medicine

## 2017-08-07 MED ORDER — DOXYCYCLINE HYCLATE 100 MG PO CAPS: 100.0000 mg | ORAL_CAPSULE | Freq: Two times a day (BID) | ORAL | 0 refills | Status: AC

## 2017-08-07 MED ORDER — DOXYCYCLINE HYCLATE 50 MG PO CAPS: 100.0000 mg | ORAL_CAPSULE | Freq: Two times a day (BID) | ORAL | 0 refills | Status: DC

## 2017-08-07 NOTE — Telephone Encounter (Signed)
MD dropped off new rx for doxycycline 50mg  caps take 2 caps by mouth 2 times a day #28. I contacted [facility] to obtain fax # and was informed that rx should have "take for seven days" on the actual rx and they also stated that the pharmacy will probably give the patient 100mg  capsules that will be taken as one caps 2 times daily.  I attempted to change and print out rx to doxy 100mg  take one capsule by mouth 2 times daily for seven days #14, but I was unaware that MD is currently out of the office to sign off on rx. I faxed the original rx and hand wrote "for seven days" and [facility] to [facility]( fax# [phone]).Kingsley SpittleGoldston, Darlene Cassady9/6/20182:29 PM

## 2017-08-07 NOTE — Telephone Encounter (Signed)
The script for doxy does not equal what is stated in the note- the script reads: Doxycycline 50mg  2 capsules by mouth 2 times daily- starting wed 9/5 til wed 9/12 #14 should be #28 If it had been 100mg  capsules it would have been #14 The pharmacy did not have 50mg  capsules and only dispensed #8 Could we send a new script for: Doxy 100mg  1 capsule 2 times daily for 7 days #14- due to pt residing in a facility you will need to be very specific as in dose, # and #of days also please print this off so we can send a copy to the facility Thank you

## 2017-08-07 NOTE — Telephone Encounter (Signed)
Meghan Hashimotoatricia from [facility] would like a callback regarding pt new meds

## 2017-09-12 ENCOUNTER — Telehealth: Payer: Self-pay | Admitting: *Deleted

## 2017-09-12 ENCOUNTER — Ambulatory Visit: Payer: Medicare Other

## 2017-09-12 NOTE — Telephone Encounter (Signed)
Call from pt's med tech - states pt's legs/ankles/feet are very swollen; ankles are the size of her calves. Unable to wear TED hoses- too tight and painful. Unsure about what to do. Appt given for 1315 PM today in [facility] - states she will call pt's son who brings pt to her appts.

## 2017-09-12 NOTE — Telephone Encounter (Signed)
Call from Elease Hashimoto - states pt's son unable to bring pt today for an appt ; no appts available on Monday. Appt given on Tues @ 1415 PM in [facility]. Instructed if swelling/pain continues or develop any other problems, to take pt to ED or UC - she voiced understanding.

## 2017-09-16 ENCOUNTER — Encounter: Payer: Self-pay | Admitting: Internal Medicine

## 2017-09-16 ENCOUNTER — Ambulatory Visit (INDEPENDENT_AMBULATORY_CARE_PROVIDER_SITE_OTHER): Payer: Medicare Other | Admitting: Internal Medicine

## 2017-09-16 VITALS — BP 107/66 | HR 52 | Temp 97.4°F | Ht <= 58 in | Wt 145.5 lb

## 2017-09-16 DIAGNOSIS — R0602 Shortness of breath: Secondary | ICD-10-CM

## 2017-09-16 DIAGNOSIS — N183 Chronic kidney disease, stage 3 unspecified: Secondary | ICD-10-CM

## 2017-09-16 DIAGNOSIS — N184 Chronic kidney disease, stage 4 (severe): Secondary | ICD-10-CM | POA: Diagnosis present

## 2017-09-16 DIAGNOSIS — Z79899 Other long term (current) drug therapy: Secondary | ICD-10-CM | POA: Diagnosis not present

## 2017-09-16 DIAGNOSIS — R079 Chest pain, unspecified: Secondary | ICD-10-CM | POA: Diagnosis not present

## 2017-09-16 DIAGNOSIS — R600 Localized edema: Secondary | ICD-10-CM

## 2017-09-16 NOTE — Patient Instructions (Addendum)
Ms. Emerson Monte,  It was a pleasure to see you today. You may continue to take lasix 80 mg daily. I will call you with the results of your blood work today if we need to make any changes. For your stockings, you may consider purchasing a plastic support device to help get the stockings on. Please schedule a follow up appointment with your PCP in 3 months. If you have any questions or concerns, call our clinic at [phone] or after hours call [phone] and ask for the internal medicine resident on call. Thank you!  - Dr. Antony Contras

## 2017-09-16 NOTE — Assessment & Plan Note (Addendum)
History of CKD IV with variations in baseline SCr between 1.4 - 2.0. Last BMP two months ago with stable SCr and slight decreased in GFR. Repeat BMP today with increased Lasix dose.  -- F/u BMP   ADDENDUM: Stable renal function. Continue lasix 80 mg daily.

## 2017-09-16 NOTE — Progress Notes (Signed)
   CC: Lower extremity swelling follow up  HPI:  [name] is a 83 y.o. female with past medical history outlined below here for follow up of her lower extremity swelling. For the details of today's visit, please refer to the assessment and plan.  Past Medical History:  Diagnosis Date  . Cholelithiasis 01/13/2015  . CKD (chronic kidney disease)   . Diverticulosis of colon   . Elevated alkaline phosphatase level    Chronic; GGT was normal (30) on 09/18/2004.  . Endometrial polyp    S/P operative hysteroscopy with removal of polyps, dilatation and curettage on 10/20/2000 by Dr. Antionette Char.  . Esophageal stricture   . Gastric ulcer 1991   H. pylori positive in 1991; EGD 01/31/00 by Dr. Juanda Chance showed mild benign distal esophageal stricture (dilator passed), gastritis with evidence of coffee-ground material, and a 4 cm non-reducible hiatal hernia; EGD  02/09/2008 showed hiatal hernia, and mild benign distal espohageal stricture, was otherwise normal.  . Gout   . Hiatal hernia   . Hypertension   . Iron deficiency anemia   . Lower GI bleed   . Malignant neoplasm of colon 1991   S/P right hemicolectomy in 1991 by Dr. Jamey Ripa, reportedly done for a malignant polyp; colonoscopy by Dr. [name] 02/09/2008 showed  marked left colon diverticulosis with partial narrowing, S/P  remote right hemicolectomy.  . Osteopenia    DXA bone density scan on 10/16/2009 showed osteopenia with an AP spine T-score of -1.5, left femoral neck T-score of -1.7, and right femoral neck T-score of -2.0.  Her FRAX score gave an estimated ten-year probability of major osteoporotic fracture of 7.3%, and a ten-year probability of hip fracture of 2.1%.  . Sensorineural hearing loss of both ears    Audiological evaluation on February 05, 2007 showed a slight to mild loss in both ears through 4000 Hz, with a moderate to severe precipitous drop in the highest frequencies (6000 to 8000 Hz).  . Tinnitus, bilateral    Audiological evaluation on February 05, 2007 showed a slight to mild loss of hearing in both ears through 4000 Hz, with a moderate to severe precipitous drop in the highest frequencies (6000 to 8000 Hz); her speech recognition abilities were within normal limits and a slightly elevated conversational level.  [name] Unsteady gait 08/07/2011  . Vitamin D deficiency    Vitamin D (25-Hydroxy) <10 ng/mL on 12/07/2009.    Review of Systems  Respiratory: Positive for shortness of breath.   Cardiovascular: Positive for chest pain.    Physical Exam:  Vitals:   09/16/17 1433  BP: 107/66  Pulse: (!) 52  Temp: (!) 97.4 F (36.3 C)  TempSrc: Oral  SpO2: 100%  Weight: 145 lb 8 oz (66 kg)  Height:  (1.448 m)    Constitutional: NAD, appears comfortable Cardiovascular: RRR, no murmurs, rubs, or gallops.  Pulmonary/Chest: CTAB, no wheezes, rales, or rhonchi.  Extremities: Warm and well perfused. Non pitting lower extremity edema  Psychiatric: Normal mood and affect  Assessment & Plan:   See Encounters Tab for problem based charting.  Patient discussed with Dr. Rogelia Boga

## 2017-09-16 NOTE — Assessment & Plan Note (Addendum)
Patient is here for follow up of her lower extremity swelling. She was prescribed compression stocking at a prior visit which were measured and custom made. Unfortunately, her son reports that her livings facility has had difficulty getting them on her, and have not been able to use them properly. She continues to take lasix 80 mg daily. Advised son to purchase one of the plastic devices available OTC to assist with putting on stockings, or purchasing a larger size compression stocking. Encouraged patient to elevate / prop her feet up while resting to help with any dependent swelling.  -- Continue lasix 80 mg daily, f/u BMP -- Elevate lower extremities while resting -- Compression stockings as needed   ADDENDUM: Renal function stable, continue lasix 80 mg daily. Repeat BMP at follow up.

## 2017-09-17 LAB — BMP8+ANION GAP
Anion Gap: 17 mmol/L (ref 10.0–18.0)
BUN / CREAT RATIO: 23 (ref 12–28)
BUN: 40 mg/dL — ABNORMAL HIGH (ref 8–27)
CO2: 27 mmol/L (ref 20–29)
CREATININE: 1.74 mg/dL — AB (ref 0.57–1.00)
Calcium: 9.2 mg/dL (ref 8.7–10.3)
Chloride: 97 mmol/L (ref 96–106)
GFR calc Af Amer: 30 mL/min/{1.73_m2} — ABNORMAL LOW (ref >59)
GFR, EST NON AFRICAN AMERICAN: 26 mL/min/{1.73_m2} — AB (ref >59)
Glucose: 109 mg/dL — ABNORMAL HIGH (ref 65–99)
Potassium: 4.4 mmol/L (ref 3.5–5.2)
Sodium: 141 mmol/L (ref 134–144)

## 2017-09-17 NOTE — Progress Notes (Signed)
Internal Medicine Clinic Attending  Case discussed with Dr. Guilloud at the time of the visit.  We reviewed the resident's history and exam and pertinent patient test results.  I agree with the assessment, diagnosis, and plan of care documented in the resident's note.  

## 2017-10-07 ENCOUNTER — Other Ambulatory Visit: Payer: Self-pay | Admitting: *Deleted

## 2017-10-07 DIAGNOSIS — F0280 Dementia in other diseases classified elsewhere without behavioral disturbance: Secondary | ICD-10-CM

## 2017-10-07 DIAGNOSIS — M1000 Idiopathic gout, unspecified site: Secondary | ICD-10-CM

## 2017-10-07 DIAGNOSIS — G301 Alzheimer's disease with late onset: Principal | ICD-10-CM

## 2017-10-07 MED ORDER — LOSARTAN POTASSIUM 25 MG PO TABS: 25.0000 mg | ORAL_TABLET | Freq: Every day | ORAL | 3 refills | Status: AC

## 2017-10-07 MED ORDER — MEMANTINE HCL 10 MG PO TABS: 10.0000 mg | ORAL_TABLET | Freq: Every day | ORAL | 3 refills | Status: AC

## 2017-10-07 MED ORDER — ALLOPURINOL 100 MG PO TABS: 200.0000 mg | ORAL_TABLET | Freq: Every day | ORAL | 3 refills | Status: DC

## 2017-10-17 ENCOUNTER — Other Ambulatory Visit: Payer: Self-pay

## 2017-10-17 NOTE — Telephone Encounter (Signed)
diltiazem (TIAZAC) 420 MG 24 hr capsule, refill request @ [facility] pharmacy.

## 2017-10-20 MED ORDER — DILTIAZEM HCL ER BEADS 420 MG PO CP24: 420.0000 mg | ORAL_CAPSULE | Freq: Every day | ORAL | 2 refills | Status: DC

## 2017-10-20 NOTE — Telephone Encounter (Signed)
Received fax from pharmacy stating "Pt is out of her Diltiazem ER tab. Pt is out of refills. Please send refills to pharmacy.  Please advise"   Will send to pcp for review.Kingsley SpittleGoldston, Darlene Cassady11/19/20184:40 PM

## 2017-10-27 ENCOUNTER — Telehealth: Payer: Self-pay | Admitting: Internal Medicine

## 2017-10-27 MED ORDER — COLCHICINE 0.6 MG PO TABS: 0.3000 mg | ORAL_TABLET | Freq: Every day | ORAL | 2 refills | Status: DC | PRN

## 2017-10-27 NOTE — Telephone Encounter (Signed)
Med Tech At [facility] requesting a hard script for the patient's Gout. Medication requested is  COLCHICINE.    Please call [phone] to speak with her.

## 2017-10-27 NOTE — Telephone Encounter (Signed)
Per chart review patient uses 0.3mg  Colchicine daily PRN during acute gout flare (last documented 10/2015), but has not needed medication in sometime. Would like patient to have medication if needed, so sent in refill to requested pharmacy.

## 2017-10-27 NOTE — Telephone Encounter (Signed)
Patient's son called asking why colchicine was reordered. He's concerned that staff @ [facility] will just give med to patient even if it's not needed. Informed him that med expired & med tech called for an active order.  Explained that colchicine is ordered with addn'l instructions  (For use only for gout flare). And md would like for it to be available  in case needed. He verbalized understanding Also requesting maalox (ordered PRN) be d/cd due to not taking it.

## 2017-10-27 NOTE — Telephone Encounter (Signed)
Colchicine not on medlist. Per Elease HashimotoPatricia (medtech @ [facility]) med has expired & was ordered as PRN. Patient has not used it in a while & per their protocol they just need an active rx if appropriate. Can be sent to [facility] pharmacy on profile)

## 2017-11-03 ENCOUNTER — Ambulatory Visit (INDEPENDENT_AMBULATORY_CARE_PROVIDER_SITE_OTHER): Payer: Medicare Other | Admitting: *Deleted

## 2017-11-03 DIAGNOSIS — Z23 Encounter for immunization: Secondary | ICD-10-CM

## 2017-11-03 NOTE — Addendum Note (Signed)
Addended by: Rozann LeschesNEDRUD, Camauri Craton A on: 11/03/2017 08:23 AM   Modules accepted: Orders

## 2017-11-28 ENCOUNTER — Other Ambulatory Visit: Payer: Self-pay | Admitting: Internal Medicine

## 2017-11-28 DIAGNOSIS — G309 Alzheimer's disease, unspecified: Principal | ICD-10-CM

## 2017-11-28 DIAGNOSIS — F0280 Dementia in other diseases classified elsewhere without behavioral disturbance: Secondary | ICD-10-CM

## 2017-11-28 NOTE — Telephone Encounter (Signed)
Elease HashimotoPatricia with [facility] requesting a refill on   EXELON 4.6 MG/24HR.   Pt is using [facility] pharmacy.

## 2017-11-29 MED ORDER — RIVASTIGMINE 4.6 MG/24HR TD PT24: MEDICATED_PATCH | TRANSDERMAL | 3 refills | Status: DC

## 2017-12-08 ENCOUNTER — Telehealth: Payer: Self-pay | Admitting: Internal Medicine

## 2017-12-08 NOTE — Telephone Encounter (Signed)
Rec'd call from [facility] @ [facility] patient is having trouble swallowing , Not eating and Holding Medications in her mouth. Please call the Facility back and speak directly to Elease Hashimotoatricia and she is very concerned about the patient's eating habits and medication patches as well.   Contacted Son Lyn Hollingshead(Alexander) who is out of town.  [facility].

## 2017-12-09 ENCOUNTER — Encounter: Payer: Self-pay | Admitting: Internal Medicine

## 2017-12-09 ENCOUNTER — Ambulatory Visit (INDEPENDENT_AMBULATORY_CARE_PROVIDER_SITE_OTHER): Payer: Medicare Other | Admitting: Internal Medicine

## 2017-12-09 VITALS — BP 139/97 | HR 67 | Temp 98.0°F | Wt 130.8 lb

## 2017-12-09 DIAGNOSIS — K228 Other specified diseases of esophagus: Secondary | ICD-10-CM | POA: Diagnosis not present

## 2017-12-09 DIAGNOSIS — G309 Alzheimer's disease, unspecified: Secondary | ICD-10-CM

## 2017-12-09 DIAGNOSIS — F0280 Dementia in other diseases classified elsewhere without behavioral disturbance: Secondary | ICD-10-CM

## 2017-12-09 DIAGNOSIS — R1310 Dysphagia, unspecified: Secondary | ICD-10-CM

## 2017-12-09 DIAGNOSIS — M19041 Primary osteoarthritis, right hand: Secondary | ICD-10-CM

## 2017-12-09 DIAGNOSIS — M19042 Primary osteoarthritis, left hand: Secondary | ICD-10-CM

## 2017-12-09 DIAGNOSIS — K029 Dental caries, unspecified: Secondary | ICD-10-CM | POA: Diagnosis not present

## 2017-12-09 DIAGNOSIS — M6258 Muscle wasting and atrophy, not elsewhere classified, other site: Secondary | ICD-10-CM | POA: Diagnosis not present

## 2017-12-09 DIAGNOSIS — K224 Dyskinesia of esophagus: Secondary | ICD-10-CM | POA: Insufficient documentation

## 2017-12-09 MED ORDER — RIVASTIGMINE 4.6 MG/24HR TD PT24: MEDICATED_PATCH | TRANSDERMAL | 3 refills | Status: AC

## 2017-12-09 NOTE — Assessment & Plan Note (Signed)
Her dementia, presumed to be Alzheimer's type, is likely progressing to include difficulty swallowing and decreased food intake which is a common manifestation. For more details, see Dysphagia A&P.   It will likely be beneficial for ongoing discussions regarding goals of care and expectant management with her son to ensure the pt or he have established advanced directives. Especially as tube feeding is not recommended in these cases.

## 2017-12-09 NOTE — Assessment & Plan Note (Addendum)
Pt's son reports the recent onset of difficulty swallowing and decreased PO intake at her nursing facility. She is not having any other sx that would suggest an underlying infection, neurologic event, or other disturbance as an etiology, though we will check a CMP to assess for underlying metabolic derangements that may be a contributing factor or a result of decreased PO intake. It will also allow evaluation of albumin and nutritional status given weight loss and decreased eating- she does appear to have slight temporal wasting when compared to her more central obesity. Her son was counseled on techniques that can enhance oral intake.  She does have a history of an esophageal stricture with dilation in 2014, and we will refer for speech evaluation with a barium swallow for more formal evaluation and recommendations. Advised that medications can be crushed to administer except for diltiazem.  At this point, her presentation is likely a result of progression of dementia but we will follow barium swallow and speech recommendations.  --Speech Eval with MBS  --Small portions, change in food consistency, assistance in feeding recommended

## 2017-12-09 NOTE — Patient Instructions (Addendum)
Nice to meet you today.   The difficulty swallowing is likely a result of progression of her dementia which is a common complication of Alzheimer's Dementia. In order to make sure there are no other underlying problems, we are going to check some blood work today. We will put in a referral for evaluation by a speech pathologist who can assess her swallowing and may be able to give more formal recommendations on a specific diet or measures that will help improve her eating and swallowing.   For medications, all the medicines are OK to crush except the Diltiazem. This medicine should be taken whole if possible. Give the patient plenty of encouragement to swallow when trying to administer this medication.   We will put in to have you see your primary care doctor in about 6 weeks to see how you are doing and follow up on your other chronic medical problems

## 2017-12-09 NOTE — Progress Notes (Signed)
CC: Trouble Swallowing   HPI:  [name] is a 84 y.o. F with a past medical history as described below who presents to the clinic with complaints of difficulty swallowing.  The majority of the history is obtained by her son who has accompanied her to clinic today.  Son states yesterday he was notified of the patient having difficulty swallowing and decreased intake at her long-term care facility.  He was told she has been holding medications in her mouth and having difficulty with solid foods.  His nursing staff has tried to crush medications, and give the patient broth to improve her eating.  He states she intermittently answers that swallowing is uncomfortable.  He is unsure of the exact onset of the symptoms.  She has not complained of any other pain or symptoms including urinary symptoms, abdominal pain, nausea. Her weight at a visit in October was 145 lbs, today at 130 lbs. At baseline, she tangentially can follow conversation or comments.  She has been able to feed herself but needs assistance with other ADLs.  She can walk with assistance but spends the majority of her time sitting.  On chart review, she does have a history of an esophageal stricture that was treated with dilation in 2014.  Past Medical History:  Diagnosis Date  . Cholelithiasis 01/13/2015  . CKD (chronic kidney disease)   . Diverticulosis of colon   . Elevated alkaline phosphatase level    Chronic; GGT was normal (30) on 09/18/2004.  . Endometrial polyp    S/P operative hysteroscopy with removal of polyps, dilatation and curettage on 10/20/2000 by Dr. Antionette Char.  . Esophageal stricture   . Gastric ulcer 1991   H. pylori positive in 1991; EGD 01/31/00 by Dr. Juanda Chance showed mild benign distal esophageal stricture (dilator passed), gastritis with evidence of coffee-ground material, and a 4 cm non-reducible hiatal hernia; EGD  02/09/2008 showed hiatal hernia, and mild benign distal espohageal stricture, was  otherwise normal.  . Gout   . Hiatal hernia   . Hypertension   . Iron deficiency anemia   . Lower GI bleed   . Malignant neoplasm of colon 1991   S/P right hemicolectomy in 1991 by Dr. Jamey Ripa, reportedly done for a malignant polyp; colonoscopy by Dr. [name] 02/09/2008 showed  marked left colon diverticulosis with partial narrowing, S/P  remote right hemicolectomy.  . Osteopenia    DXA bone density scan on 10/16/2009 showed osteopenia with an AP spine T-score of -1.5, left femoral neck T-score of -1.7, and right femoral neck T-score of -2.0.  Her FRAX score gave an estimated ten-year probability of major osteoporotic fracture of 7.3%, and a ten-year probability of hip fracture of 2.1%.  . Sensorineural hearing loss of both ears    Audiological evaluation on February 05, 2007 showed a slight to mild loss in both ears through 4000 Hz, with a moderate to severe precipitous drop in the highest frequencies (6000 to 8000 Hz).  . Tinnitus, bilateral    Audiological evaluation on February 05, 2007 showed a slight to mild loss of hearing in both ears through 4000 Hz, with a moderate to severe precipitous drop in the highest frequencies (6000 to 8000 Hz); her speech recognition abilities were within normal limits and a slightly elevated conversational level.  [name] Unsteady gait 08/07/2011  . Vitamin D deficiency    Vitamin D (25-Hydroxy) <10 ng/mL on 12/07/2009.   Review of Systems:  Review of Systems  Constitutional: Positive for weight  loss. Negative for fever.  Gastrointestinal: Negative for abdominal pain and vomiting.  Genitourinary: Negative for dysuria.  Neurological: Negative for speech change and focal weakness.     Physical Exam:  Vitals:   12/09/17 1005  BP: (!) 139/97  Pulse: 67  Temp: 98 F (36.7 C)  TempSrc: Oral  SpO2: 97%  Weight: 130 lb 12.8 oz (59.3 kg)   General: Pleasant elderly female , no acute distress HEENT: Dental caries, no visualized exudate, erythema, or thrush. Mild  temporal wasting  CV: RRR  Resp: Normal work of breathing, no distress  Abd: Soft, +BS, non-tender  Extr: LE edema with compression stockings in place, chronic changes of arthritis to bilateral hands  Neuro: Alert, CN intact, full strength  Skin: Warm, dry      Assessment & Plan:   See Encounters Tab for problem based charting.  Patient discussed with Dr. Criselda PeachesMullen

## 2017-12-09 NOTE — Progress Notes (Signed)
Internal Medicine Clinic Attending  Case discussed with Dr. Harden at the time of the visit.  We reviewed the resident's history and exam and pertinent patient test results.  I agree with the assessment, diagnosis, and plan of care documented in the resident's note.  

## 2017-12-10 ENCOUNTER — Inpatient Hospital Stay (HOSPITAL_COMMUNITY)
Admission: AD | Admit: 2017-12-10 | Discharge: 2017-12-21 | DRG: 683 | Disposition: A | Discharge status: Skilled Nursing Facility | Payer: Medicare Other | Source: Ambulatory Visit | Attending: Internal Medicine | Admitting: Internal Medicine

## 2017-12-10 DIAGNOSIS — K5730 Diverticulosis of large intestine without perforation or abscess without bleeding: Secondary | ICD-10-CM | POA: Diagnosis present

## 2017-12-10 DIAGNOSIS — M8580 Other specified disorders of bone density and structure, unspecified site: Secondary | ICD-10-CM | POA: Diagnosis present

## 2017-12-10 DIAGNOSIS — K224 Dyskinesia of esophagus: Secondary | ICD-10-CM | POA: Diagnosis present

## 2017-12-10 DIAGNOSIS — K228 Other specified diseases of esophagus: Secondary | ICD-10-CM | POA: Diagnosis not present

## 2017-12-10 DIAGNOSIS — Z79899 Other long term (current) drug therapy: Secondary | ICD-10-CM | POA: Diagnosis not present

## 2017-12-10 DIAGNOSIS — K222 Esophageal obstruction: Secondary | ICD-10-CM | POA: Diagnosis present

## 2017-12-10 DIAGNOSIS — I129 Hypertensive chronic kidney disease with stage 1 through stage 4 chronic kidney disease, or unspecified chronic kidney disease: Secondary | ICD-10-CM | POA: Diagnosis not present

## 2017-12-10 DIAGNOSIS — Z803 Family history of malignant neoplasm of breast: Secondary | ICD-10-CM

## 2017-12-10 DIAGNOSIS — G308 Other Alzheimer's disease: Secondary | ICD-10-CM | POA: Diagnosis not present

## 2017-12-10 DIAGNOSIS — D509 Iron deficiency anemia, unspecified: Secondary | ICD-10-CM | POA: Diagnosis present

## 2017-12-10 DIAGNOSIS — I120 Hypertensive chronic kidney disease with stage 5 chronic kidney disease or end stage renal disease: Secondary | ICD-10-CM | POA: Diagnosis present

## 2017-12-10 DIAGNOSIS — E870 Hyperosmolality and hypernatremia: Secondary | ICD-10-CM | POA: Diagnosis not present

## 2017-12-10 DIAGNOSIS — N179 Acute kidney failure, unspecified: Secondary | ICD-10-CM | POA: Diagnosis present

## 2017-12-10 DIAGNOSIS — M109 Gout, unspecified: Secondary | ICD-10-CM | POA: Diagnosis present

## 2017-12-10 DIAGNOSIS — Z9049 Acquired absence of other specified parts of digestive tract: Secondary | ICD-10-CM

## 2017-12-10 DIAGNOSIS — R1310 Dysphagia, unspecified: Secondary | ICD-10-CM

## 2017-12-10 DIAGNOSIS — I130 Hypertensive heart and chronic kidney disease with heart failure and stage 1 through stage 4 chronic kidney disease, or unspecified chronic kidney disease: Secondary | ICD-10-CM | POA: Diagnosis not present

## 2017-12-10 DIAGNOSIS — N189 Chronic kidney disease, unspecified: Secondary | ICD-10-CM | POA: Diagnosis present

## 2017-12-10 DIAGNOSIS — R10817 Generalized abdominal tenderness: Secondary | ICD-10-CM | POA: Diagnosis not present

## 2017-12-10 DIAGNOSIS — R54 Age-related physical debility: Secondary | ICD-10-CM | POA: Diagnosis present

## 2017-12-10 DIAGNOSIS — G309 Alzheimer's disease, unspecified: Secondary | ICD-10-CM | POA: Diagnosis present

## 2017-12-10 DIAGNOSIS — M1990 Unspecified osteoarthritis, unspecified site: Secondary | ICD-10-CM | POA: Diagnosis present

## 2017-12-10 DIAGNOSIS — Z85038 Personal history of other malignant neoplasm of large intestine: Secondary | ICD-10-CM

## 2017-12-10 DIAGNOSIS — N19 Unspecified kidney failure: Secondary | ICD-10-CM

## 2017-12-10 DIAGNOSIS — Z66 Do not resuscitate: Secondary | ICD-10-CM | POA: Diagnosis not present

## 2017-12-10 DIAGNOSIS — K449 Diaphragmatic hernia without obstruction or gangrene: Secondary | ICD-10-CM | POA: Diagnosis present

## 2017-12-10 DIAGNOSIS — H9313 Tinnitus, bilateral: Secondary | ICD-10-CM | POA: Diagnosis present

## 2017-12-10 DIAGNOSIS — Z8711 Personal history of peptic ulcer disease: Secondary | ICD-10-CM

## 2017-12-10 DIAGNOSIS — I5030 Unspecified diastolic (congestive) heart failure: Secondary | ICD-10-CM | POA: Diagnosis not present

## 2017-12-10 DIAGNOSIS — E860 Dehydration: Secondary | ICD-10-CM | POA: Diagnosis present

## 2017-12-10 DIAGNOSIS — R1319 Other dysphagia: Secondary | ICD-10-CM | POA: Diagnosis not present

## 2017-12-10 DIAGNOSIS — Z515 Encounter for palliative care: Secondary | ICD-10-CM | POA: Diagnosis not present

## 2017-12-10 DIAGNOSIS — N281 Cyst of kidney, acquired: Secondary | ICD-10-CM | POA: Diagnosis present

## 2017-12-10 DIAGNOSIS — N184 Chronic kidney disease, stage 4 (severe): Secondary | ICD-10-CM | POA: Diagnosis not present

## 2017-12-10 DIAGNOSIS — I10 Essential (primary) hypertension: Secondary | ICD-10-CM | POA: Diagnosis present

## 2017-12-10 DIAGNOSIS — E861 Hypovolemia: Secondary | ICD-10-CM | POA: Diagnosis present

## 2017-12-10 DIAGNOSIS — R634 Abnormal weight loss: Secondary | ICD-10-CM | POA: Diagnosis present

## 2017-12-10 DIAGNOSIS — E871 Hypo-osmolality and hyponatremia: Secondary | ICD-10-CM | POA: Diagnosis present

## 2017-12-10 DIAGNOSIS — N185 Chronic kidney disease, stage 5: Secondary | ICD-10-CM | POA: Diagnosis present

## 2017-12-10 DIAGNOSIS — E1122 Type 2 diabetes mellitus with diabetic chronic kidney disease: Secondary | ICD-10-CM | POA: Diagnosis present

## 2017-12-10 DIAGNOSIS — D649 Anemia, unspecified: Secondary | ICD-10-CM | POA: Diagnosis present

## 2017-12-10 DIAGNOSIS — F0280 Dementia in other diseases classified elsewhere without behavioral disturbance: Secondary | ICD-10-CM | POA: Diagnosis present

## 2017-12-10 DIAGNOSIS — G301 Alzheimer's disease with late onset: Secondary | ICD-10-CM | POA: Diagnosis not present

## 2017-12-10 DIAGNOSIS — H905 Unspecified sensorineural hearing loss: Secondary | ICD-10-CM | POA: Diagnosis present

## 2017-12-10 LAB — CMP14 + ANION GAP
ALT: 11 IU/L (ref 0–32)
ANION GAP: 24 mmol/L — AB (ref 10.0–18.0)
AST: 17 IU/L (ref 0–40)
Albumin/Globulin Ratio: 1.5 (ref 1.2–2.2)
Albumin: 4.8 g/dL — ABNORMAL HIGH (ref 3.5–4.7)
Alkaline Phosphatase: 99 IU/L (ref 39–117)
BUN/Creatinine Ratio: 11 — ABNORMAL LOW (ref 12–28)
BUN: 91 mg/dL (ref 8–27)
Bilirubin Total: 0.3 mg/dL (ref 0.0–1.2)
CALCIUM: 11.2 mg/dL — AB (ref 8.7–10.3)
CO2: 24 mmol/L (ref 20–29)
CREATININE: 7.94 mg/dL — AB (ref 0.57–1.00)
Chloride: 86 mmol/L — ABNORMAL LOW (ref 96–106)
GFR, EST AFRICAN AMERICAN: 5 mL/min/{1.73_m2} — AB (ref >59)
GFR, EST NON AFRICAN AMERICAN: 4 mL/min/{1.73_m2} — AB (ref >59)
GLUCOSE: 104 mg/dL — AB (ref 65–99)
Globulin, Total: 3.1 g/dL (ref 1.5–4.5)
Potassium: 3.9 mmol/L (ref 3.5–5.2)
Sodium: 134 mmol/L (ref 134–144)
TOTAL PROTEIN: 7.9 g/dL (ref 6.0–8.5)

## 2017-12-10 LAB — CBC
HCT: 38.1 % (ref 36.0–46.0)
Hemoglobin: 13.1 g/dL (ref 12.0–15.0)
MCH: 29.4 pg (ref 26.0–34.0)
MCHC: 34.4 g/dL (ref 30.0–36.0)
MCV: 85.6 fL (ref 78.0–100.0)
Platelets: 248 10*3/uL (ref 150–400)
RBC: 4.45 MIL/uL (ref 3.87–5.11)
RDW: 15.3 % (ref 11.5–15.5)
WBC: 5.9 10*3/uL (ref 4.0–10.5)

## 2017-12-10 LAB — COMPREHENSIVE METABOLIC PANEL
ALT: 13 U/L — ABNORMAL LOW (ref 14–54)
AST: 21 U/L (ref 15–41)
Albumin: 4 g/dL (ref 3.5–5.0)
Alkaline Phosphatase: 88 U/L (ref 38–126)
Anion gap: 17 — ABNORMAL HIGH (ref 5–15)
BUN: 97 mg/dL — ABNORMAL HIGH (ref 6–20)
CO2: 24 mmol/L (ref 22–32)
Calcium: 11.1 mg/dL — ABNORMAL HIGH (ref 8.9–10.3)
Chloride: 88 mmol/L — ABNORMAL LOW (ref 101–111)
Creatinine, Ser: 8.76 mg/dL — ABNORMAL HIGH (ref 0.44–1.00)
GFR calc Af Amer: 4 mL/min — ABNORMAL LOW (ref >60)
GFR calc non Af Amer: 4 mL/min — ABNORMAL LOW (ref >60)
Glucose, Bld: 113 mg/dL — ABNORMAL HIGH (ref 65–99)
Potassium: 3.9 mmol/L (ref 3.5–5.1)
Sodium: 129 mmol/L — ABNORMAL LOW (ref 135–145)
Total Bilirubin: 0.8 mg/dL (ref 0.3–1.2)
Total Protein: 7.7 g/dL (ref 6.5–8.1)

## 2017-12-10 MED ORDER — DILTIAZEM HCL ER BEADS 300 MG PO CP24
420.0000 mg | ORAL_CAPSULE | Freq: Every day | ORAL | Status: DC
  Administered 2017-12-10: 420 mg via ORAL

## 2017-12-10 MED ORDER — RIVASTIGMINE 4.6 MG/24HR TD PT24
4.6000 mg | MEDICATED_PATCH | Freq: Every day | TRANSDERMAL | Status: DC
  Administered 2017-12-11 – 2017-12-21 (×11): 4.6 mg via TRANSDERMAL
  Filled 2017-12-10 (×11): qty 1

## 2017-12-10 MED ORDER — ACETAMINOPHEN 325 MG PO TABS: 650.0000 mg | ORAL_TABLET | Freq: Four times a day (QID) | ORAL | Status: DC | PRN

## 2017-12-10 MED ORDER — SENNOSIDES-DOCUSATE SODIUM 8.6-50 MG PO TABS: 1.0000 | ORAL_TABLET | Freq: Every evening | ORAL | Status: DC | PRN

## 2017-12-10 MED ORDER — SODIUM CHLORIDE 0.9 % IV SOLN: 250.0000 mL | INTRAVENOUS | Status: DC | PRN

## 2017-12-10 MED ORDER — ONDANSETRON HCL 4 MG PO TABS: 4.0000 mg | ORAL_TABLET | Freq: Four times a day (QID) | ORAL | Status: DC | PRN

## 2017-12-10 MED ORDER — ACETAMINOPHEN 650 MG RE SUPP
650.0000 mg | Freq: Four times a day (QID) | RECTAL | Status: DC | PRN
  Administered 2017-12-16 – 2017-12-21 (×6): 650 mg via RECTAL
  Filled 2017-12-10 (×7): qty 1

## 2017-12-10 MED ORDER — SODIUM CHLORIDE 0.9 % IV SOLN
INTRAVENOUS | Status: AC
  Administered 2017-12-10: 23:00:00 via INTRAVENOUS

## 2017-12-10 MED ORDER — SODIUM CHLORIDE 0.9% FLUSH
3.0000 mL | Freq: Two times a day (BID) | INTRAVENOUS | Status: DC
  Administered 2017-12-10 – 2017-12-19 (×13): 3 mL via INTRAVENOUS

## 2017-12-10 MED ORDER — HEPARIN SODIUM (PORCINE) 5000 UNIT/ML IJ SOLN: 5000.0000 [IU] | Freq: Three times a day (TID) | INTRAMUSCULAR | Status: DC

## 2017-12-10 MED ORDER — HEPARIN SODIUM (PORCINE) 5000 UNIT/ML IJ SOLN
5000.0000 [IU] | Freq: Three times a day (TID) | INTRAMUSCULAR | Status: DC
  Administered 2017-12-11 – 2017-12-21 (×31): 5000 [IU] via SUBCUTANEOUS
  Filled 2017-12-10 (×31): qty 1

## 2017-12-10 MED ORDER — ONDANSETRON HCL 4 MG/2ML IJ SOLN
4.0000 mg | Freq: Four times a day (QID) | INTRAMUSCULAR | Status: DC | PRN
  Administered 2017-12-19: 4 mg via INTRAVENOUS
  Filled 2017-12-10: qty 2

## 2017-12-10 MED ORDER — SODIUM CHLORIDE 0.9% FLUSH: 3.0000 mL | INTRAVENOUS | Status: DC | PRN

## 2017-12-10 NOTE — H&P (Signed)
Date: 12/10/2017               Patient Name:  [name] MRN: [id number]  DOB: 07/20/1926 Age / Sex: 84 y.o., female   PCP: Rozann LeschesNedrud, Marybeth, MD         Medical Service: Internal Medicine Teaching Service         Attending Physician: Dr. Sandre Kittyaines, Elwin MochaAlexander N, MD    First Contact: Dr. Lorenso CourierVahini Chundi Pager: [phone]  Second Contact: Dr. Noemi ChapelBethany Molt Pager: [phone]       After Hours (After 5p/  First Contact Pager: [phone]  weekends / holidays): Second Contact Pager: [phone]   Chief Complaint: Acutely elevated creatinine   History of Present Illness:  [name] is an 84yo female with PMH significant for Alzheimer's dementia, esophageal stricture in 2014 s/p dilation, CKD, HTN, and colon cancer s/p right hemicolectomy in 1991 who presents as a direct admit for acute elevation in creatinine.  She was seen in the IMTS clinic on 12/09/2017 with complaints of difficulty swallowing and decreased PO intake at her long term care facility, thought to be secondary to progression of dementia. She has lost 15lbs since October 2018. Son states that for the last couple days, he was notified by SNF that the patient was not eating much and was intermittently complaining of pain with swallowing. He reports that she had no other complaints, including dysuria or other pain.  Patient is unable to provide any history. I spoke to her son, [name], over the phone who states that at baseline, she is not very functional. She can tell you if she is hurting, but otherwise does not interact much. She is able to ambulate at baseline, but needs assistance with ADLs. Patient lives at a SNF and her son sees her twice a week.  On admission: - BP 132/61, HR 73, RR 20, temp 97.3, O2 82% on RA - CMP notable for BUN 91, Cr 7.94. CBC wnl.  Meds:  - Allopurinol 200mg  daily - Colchicine 0.3mg  daily PRN - Diltiazem 420mg  qday - Furosemide 80mg  BID (only supposed to be taking qday) - Loperamide (not  taking) - Losartan 25mg  daily - Memantine 10mg  daily - Klor-con 10mEq daily - Rivastigmine 4.6mg  qday (Confirmed with son)  Allergies: Allergies as of 12/10/2017  . (No Known Allergies)   Past Medical History:  Diagnosis Date  . Cholelithiasis 01/13/2015  . CKD (chronic kidney disease)   . Diverticulosis of colon   . Elevated alkaline phosphatase level    Chronic; GGT was normal (30) on 09/18/2004.  . Endometrial polyp    S/P operative hysteroscopy with removal of polyps, dilatation and curettage on 10/20/2000 by Dr. Antionette CharLisa Jackson-Moore.  . Esophageal stricture   . Gastric ulcer 1991   H. pylori positive in 1991; EGD 01/31/00 by Dr. Juanda ChanceBrodie showed mild benign distal esophageal stricture (dilator passed), gastritis with evidence of coffee-ground material, and a 4 cm non-reducible hiatal hernia; EGD  02/09/2008 showed hiatal hernia, and mild benign distal espohageal stricture, was otherwise normal.  . Gout   . Hiatal hernia   . Hypertension   . Iron deficiency anemia   . Lower GI bleed   . Malignant neoplasm of colon 1991   S/P right hemicolectomy in 1991 by Dr. Jamey RipaStreck, reportedly done for a malignant polyp; colonoscopy by Dr. Lina Sarora Brodie 02/09/2008 showed  marked left colon diverticulosis with partial narrowing, S/P  remote right hemicolectomy.  . Osteopenia    DXA bone density scan  on 10/16/2009 showed osteopenia with an AP spine T-score of -1.5, left femoral neck T-score of -1.7, and right femoral neck T-score of -2.0.  Her FRAX score gave an estimated ten-year probability of major osteoporotic fracture of 7.3%, and a ten-year probability of hip fracture of 2.1%.  . Sensorineural hearing loss of both ears    Audiological evaluation on February 05, 2007 showed a slight to mild loss in both ears through 4000 Hz, with a moderate to severe precipitous drop in the highest frequencies (6000 to 8000 Hz).  . Tinnitus, bilateral    Audiological evaluation on February 05, 2007 showed a slight to mild  loss of hearing in both ears through 4000 Hz, with a moderate to severe precipitous drop in the highest frequencies (6000 to 8000 Hz); her speech recognition abilities were within normal limits and a slightly elevated conversational level.  [name] Unsteady gait 08/07/2011  . Vitamin D deficiency    Vitamin D (25-Hydroxy) <10 ng/mL on 12/07/2009.   Family History:  Unable to obtain secondary to dementia.  Social History:  - Lives at a SNF - Son sees her twice a week - Has three other children Unable to obtain secondary to dementia.  Review of Systems: Unable to obtain ROS secondary to dementia  Physical Exam: Blood pressure 132/61, pulse 73, temperature (!) 97.3 F (36.3 C), temperature source Axillary, resp. rate 20, SpO2 (!) 82 %.  GEN: Frail-appearing elderly female lying in bed in NAD. Alert. Not oriented. Pulls away from stethoscope. Garbled speech. Grimaces when touched or moved. HENT: [place]/AT. Mild temporal wasting. EYES: Sclera non-icteric. Conjunctiva clear. RESP: Clear to auscultation anteriorly. Patient pulling away from stethoscope and unable to complete full lung exam. No increased work of breathing. CV: Normal rate and regular rhythm. No murmurs, gallops, or rubs. 1+ nonpitting BLE edema. ABD: Soft. Non-distended. Normoactive bowel sounds. EXT: 1+ nonpitting BLE edema. NEURO: Cranial nerves II-XII grossly intact. Garbled speech. Not following commands. PSYCH: Patient is calm and pleasant. Appropriate affect. Well-groomed; speech is appropriate and on-subject.  Labs CBC Latest Ref Rng & Units 01/15/2017 07/01/2016 10/22/2015  WBC 4.0 - 10.5 K/uL 6.0 - 6.3  Hemoglobin 12.0 - 15.0 g/dL 16.1 11.2(L) 11.0(L)  Hematocrit 36.0 - 46.0 % 38.2 33.0(L) 32.5(L)  Platelets 150 - 400 K/uL 193 - 190   CMP Latest Ref Rng & Units 12/09/2017 09/16/2017 07/23/2017  Glucose 65 - 99 mg/dL 096(E) 454(U) 981(X)  BUN 8 - 27 mg/dL 91(YN) 82(N) 56(O)  Creatinine 0.57 - 1.00 mg/dL 1.30(Q) 6.57(Q) 4.69(G)   Sodium 134 - 144 mmol/L 134 141 142  Potassium 3.5 - 5.2 mmol/L 3.9 4.4 4.5  Chloride 96 - 106 mmol/L 86(L) 97 99  CO2 20 - 29 mmol/L 24 27 25   Calcium 8.7 - 10.3 mg/dL 11.2(H) 9.2 9.6  Total Protein 6.0 - 8.5 g/dL 7.9 - -  Total Bilirubin 0.0 - 1.2 mg/dL 0.3 - -  Alkaline Phos 39 - 117 IU/L 99 - -  AST 0 - 40 IU/L 17 - -  ALT 0 - 32 IU/L 11 - -   Assessment & Plan by Problem: Principal Problem:   AKI (acute kidney injury) (HCC) Active Problems:   Essential hypertension   CKD (chronic kidney disease)   Dementia of the Alzheimer's type   Difficulty swallowing   Hypercalcemia  Ms. Waguespack is an 84yo female with PMH significant for Alzheimer's dementia, esophageal stricture in 2014 s/p dilation, CKD, HTN, and colon cancer s/p right hemicolectomy in 1991 who  presents as a direct admit from clinic, found to have acute elevation in creatinine.  AKI on CKD Cr 7.94 (baseline Cr ~1.4-2.0). BUN 91 (previously 25-43). Unclear etiology. Per son's report she has had decreased PO intake for the last few days, however I do not know if dehydration would cause such an acute elevation in her creatinine. BUN:Cr ratio is just barely low, which may suggest intrinsic or post-renal kidney injury. Will fluid challenge with IV fluids and further work-up the cause of her AKI. - RFP in AM - FENa - Renal U/S - UA - Microalbumin:creatinine ratio - Orthostatics - IV NS 75cc/hr x12hr - I/Os - Daily weights - Avoid nephrotoxic agents - Consider Nephrology consult in the AM  Dysphagia, Odynophagia Seen in IMTS clinic on 12/09/2017 for these complaints. Per chart review, has lost ~15lbs since October 2018. She does have a history of esophageal stricture requiring dilation in 2014. Possibly secondary to progression of dementia vs esophageal pathology. Outpatient plan was for speech evaluation with barium swallow. I am not sure she is oriented enough to be able to follow commands and complete the barium swallow.  We will have speech therapy see her first. - Daily weights - NPO until speech/swallow eval  Hypercalcemia Ca elevated at 11.2. Will repeat CMP to confirm. - F/u CMP - RFP in AM  Hx of HTN BP 132/61, HR 73. Home regimen includes diltiazem 420mg  daily, lasix 80mg  BID, and losartan 25mg  daily. - Hold home lasix and losartan secondary to AKI - Holding home diltiazem with normotensive BP  Alzheimer's Dementia Per son, patient is at baseline. May have some component of uremia? Home regimen includes rivastigmine patch and memantine 10mg  daily. I spoke to the patient's son over the phone [name] - [phone]) who states that they have not had a discussion regarding code status or goals of care/end of life care. He did confirm that she is full code, but states that she has 3 other children and they might need to have a discussion together. Will need further discussion regarding goals of care and end of life care with family. May benefit from Palliative Medicine consult. - Continue home transdermal rivastigmine patch 4.6mg  daily - Delirium precautions - PT/OT eval  Diet: NPO until speech/swallow eval VTE PPx: SQH Code Status: Full code (confirmed with son, [name]) Dispo: Admit patient to Inpatient with expected length of stay greater than 2 midnights.  Signed: Scherrie Gerlach, MD Internal Medicine PGY1 12/10/2017, 9:48 PM

## 2017-12-11 ENCOUNTER — Other Ambulatory Visit: Payer: Self-pay

## 2017-12-11 ENCOUNTER — Encounter (HOSPITAL_COMMUNITY): Payer: Self-pay

## 2017-12-11 ENCOUNTER — Inpatient Hospital Stay (HOSPITAL_COMMUNITY): Payer: Medicare Other

## 2017-12-11 DIAGNOSIS — R10817 Generalized abdominal tenderness: Secondary | ICD-10-CM

## 2017-12-11 DIAGNOSIS — N281 Cyst of kidney, acquired: Secondary | ICD-10-CM

## 2017-12-11 LAB — SODIUM, URINE, RANDOM: Sodium, Ur: 18 mmol/L

## 2017-12-11 LAB — RENAL FUNCTION PANEL
Albumin: 3.9 g/dL (ref 3.5–5.0)
Albumin: 3.5 g/dL (ref 3.5–5.0)
Anion gap: 15 (ref 5–15)
Anion gap: 14 (ref 5–15)
BUN: 101 mg/dL — ABNORMAL HIGH (ref 6–20)
BUN: 97 mg/dL — ABNORMAL HIGH (ref 6–20)
CO2: 27 mmol/L (ref 22–32)
CO2: 23 mmol/L (ref 22–32)
Calcium: 10.8 mg/dL — ABNORMAL HIGH (ref 8.9–10.3)
Calcium: 9.9 mg/dL (ref 8.9–10.3)
Chloride: 87 mmol/L — ABNORMAL LOW (ref 101–111)
Chloride: 94 mmol/L — ABNORMAL LOW (ref 101–111)
Creatinine, Ser: 8.95 mg/dL — ABNORMAL HIGH (ref 0.44–1.00)
Creatinine, Ser: 8.71 mg/dL — ABNORMAL HIGH (ref 0.44–1.00)
GFR calc Af Amer: 4 mL/min — ABNORMAL LOW (ref >60)
GFR calc Af Amer: 4 mL/min — ABNORMAL LOW (ref >60)
GFR calc non Af Amer: 3 mL/min — ABNORMAL LOW (ref >60)
GFR calc non Af Amer: 4 mL/min — ABNORMAL LOW (ref >60)
Glucose, Bld: 105 mg/dL — ABNORMAL HIGH (ref 65–99)
Glucose, Bld: 157 mg/dL — ABNORMAL HIGH (ref 65–99)
Phosphorus: 5.8 mg/dL — ABNORMAL HIGH (ref 2.5–4.6)
Phosphorus: 5.1 mg/dL — ABNORMAL HIGH (ref 2.5–4.6)
Potassium: 3.7 mmol/L (ref 3.5–5.1)
Potassium: 3.8 mmol/L (ref 3.5–5.1)
Sodium: 129 mmol/L — ABNORMAL LOW (ref 135–145)
Sodium: 131 mmol/L — ABNORMAL LOW (ref 135–145)

## 2017-12-11 LAB — CREATININE, URINE, RANDOM: Creatinine, Urine: 132.44 mg/dL

## 2017-12-11 LAB — URINALYSIS, COMPLETE (UACMP) WITH MICROSCOPIC
Bilirubin Urine: NEGATIVE
Glucose, UA: NEGATIVE mg/dL
Ketones, ur: NEGATIVE mg/dL
Nitrite: NEGATIVE
Protein, ur: NEGATIVE mg/dL
Specific Gravity, Urine: 1.011 (ref 1.005–1.030)
pH: 5 (ref 5.0–8.0)

## 2017-12-11 LAB — OSMOLALITY: Osmolality: 313 mOsm/kg — ABNORMAL HIGH (ref 275–295)

## 2017-12-11 LAB — OSMOLALITY, URINE: Osmolality, Ur: 317 mOsm/kg (ref 300–900)

## 2017-12-11 MED ORDER — ENSURE ENLIVE PO LIQD
237.0000 mL | Freq: Two times a day (BID) | ORAL | Status: DC
  Administered 2017-12-11 – 2017-12-12 (×2): 237 mL via ORAL

## 2017-12-11 MED ORDER — SODIUM CHLORIDE 0.9 % IV SOLN
INTRAVENOUS | Status: AC
  Administered 2017-12-11 – 2017-12-12 (×3): via INTRAVENOUS

## 2017-12-11 NOTE — Evaluation (Signed)
Clinical/Bedside Swallow Evaluation Patient Details  Name: [name] MRN: [id number] Date of Birth: 05/19/1926  Today's Date: 12/11/2017 Time: SLP Start Time (ACUTE ONLY): 1025 SLP Stop Time (ACUTE ONLY): 1038 SLP Time Calculation (min) (ACUTE ONLY): 13 min  Past Medical History:  Past Medical History:  Diagnosis Date  . Cholelithiasis 01/13/2015  . CKD (chronic kidney disease)   . Diverticulosis of colon   . Elevated alkaline phosphatase level    Chronic; GGT was normal (30) on 09/18/2004.  . Endometrial polyp    S/P operative hysteroscopy with removal of polyps, dilatation and curettage on 10/20/2000 by Dr. Antionette CharLisa Jackson-Moore.  . Esophageal stricture   . Gastric ulcer 1991   H. pylori positive in 1991; EGD 01/31/00 by Dr. Juanda ChanceBrodie showed mild benign distal esophageal stricture (dilator passed), gastritis with evidence of coffee-ground material, and a 4 cm non-reducible hiatal hernia; EGD  02/09/2008 showed hiatal hernia, and mild benign distal espohageal stricture, was otherwise normal.  . Gout   . Hiatal hernia   . Hypertension   . Iron deficiency anemia   . Lower GI bleed   . Malignant neoplasm of colon 1991   S/P right hemicolectomy in 1991 by Dr. Jamey RipaStreck, reportedly done for a malignant polyp; colonoscopy by Dr. Lina Sarora Brodie 02/09/2008 showed  marked left colon diverticulosis with partial narrowing, S/P  remote right hemicolectomy.  . Osteopenia    DXA bone density scan on 10/16/2009 showed osteopenia with an AP spine T-score of -1.5, left femoral neck T-score of -1.7, and right femoral neck T-score of -2.0.  Her FRAX score gave an estimated ten-year probability of major osteoporotic fracture of 7.3%, and a ten-year probability of hip fracture of 2.1%.  . Sensorineural hearing loss of both ears    Audiological evaluation on February 05, 2007 showed a slight to mild loss in both ears through 4000 Hz, with a moderate to severe precipitous drop in the highest frequencies (6000 to 8000 Hz).   . Tinnitus, bilateral    Audiological evaluation on February 05, 2007 showed a slight to mild loss of hearing in both ears through 4000 Hz, with a moderate to severe precipitous drop in the highest frequencies (6000 to 8000 Hz); her speech recognition abilities were within normal limits and a slightly elevated conversational level.  [name]. Unsteady gait 08/07/2011  . Vitamin D deficiency    Vitamin D (25-Hydroxy) <10 ng/mL on 12/07/2009.   Past Surgical History:  Past Surgical History:  Procedure Laterality Date  . HEMICOLECTOMY  1991   Right -  with ileocolic anastamosis by Dr. Jamey RipaStreck  . HYSTEROSCOPY     S/P operative hysteroscopy with removal of polyps, dilatation and curettage on 10/20/2000 by Dr. Antionette CharLisa Jackson-Moore.   HPI:  [name] is an 84yo female with PMH significant for Alzheimer's dementia, esophageal stricture in 2014 s/p dilation, CKD, HTN, and colon cancer s/p right hemicolectomy in 1991 who presents as a direct admit for acute elevation in creatinine. She was seen in the IMTS clinic on 12/09/2017 with complaints of difficulty swallowing and decreased PO intake at her long term care facility, thought to be secondary to progression of dementia. Shehas lost 15lbs since October 2018. Son states that for the last couple days, he was notified by SNF that the patient was not eating much and was intermittently complaining of pain with swallowing. He reports that she had no other complaints, including dysuria or other pain.    Assessment / Plan / Recommendation Clinical Impression  Pt demonstrates  concern for primary esophageal dysphagia, perhaps stricture vs dysmotility. Pts cognition is impaired for aiblity to fully participate with therapist, but with max verbal and tactile cues pt very reluctantly accepted a taste of puree and two very small sips of water from a straw. Each swallow associated with grimacing, pt holding her hand to her sternum. One cough noted when SLP provided total assist for a  cup sip as the bolus appered larger than pt expected. Modified barium swallow would be non diagnositc for an esopahgeal dysphagia. Esophagram is a more appropriate assessment, but agree that pt would be unlikely to take barium at the appropriate rate and quantity to identify a stricture. May want to consider referal to GI to determine if pt is a candidate for interventions such as endoscopy. For now recommend pt be offered pureed foods and thin liquids though expect intake to be poor. No further SLP interventions would benefit this pt in this case. Will sign off.  SLP Visit Diagnosis: Dysphagia, unspecified (R13.10)    Aspiration Risk  Mild aspiration risk;Risk for inadequate nutrition/hydration    Diet Recommendation Dysphagia 1 (Puree);Thin liquid   Liquid Administration via: Cup;Straw Medication Administration: Crushed with puree Supervision: Full supervision/cueing for compensatory strategies    Other  Recommendations Recommended Consults: Consider GI evaluation;Consider esophageal assessment Oral Care Recommendations: Oral care BID   Follow up Recommendations        Frequency and Duration            Prognosis        Swallow Study   General HPI: [name] is an 84yo female with PMH significant for Alzheimer's dementia, esophageal stricture in 2014 s/p dilation, CKD, HTN, and colon cancer s/p right hemicolectomy in 1991 who presents as a direct admit for acute elevation in creatinine. She was seen in the IMTS clinic on 12/09/2017 with complaints of difficulty swallowing and decreased PO intake at her long term care facility, thought to be secondary to progression of dementia. Shehas lost 15lbs since October 2018. Son states that for the last couple days, he was notified by SNF that the patient was not eating much and was intermittently complaining of pain with swallowing. He reports that she had no other complaints, including dysuria or other pain.  Type of Study: Bedside Swallow  Evaluation Previous Swallow Assessment: none Diet Prior to this Study: NPO Temperature Spikes Noted: No Respiratory Status: Room air History of Recent Intubation: No Behavior/Cognition: Requires cueing Oral Care Completed by SLP: No Oral Cavity - Dentition: Adequate natural dentition;Missing dentition Vision: Impaired for self-feeding Self-Feeding Abilities: Total assist Patient Positioning: Upright in bed Baseline Vocal Quality: Not observed    Oral/Motor/Sensory Function Overall Oral Motor/Sensory Function: Within functional limits   Ice Chips Ice chips: Not tested   Thin Liquid Thin Liquid: Impaired Presentation: Cup;Straw Oral Phase Functional Implications: Oral holding;Prolonged oral transit Pharyngeal  Phase Impairments: Cough - Immediate    Nectar Thick Nectar Thick Liquid: Not tested   Honey Thick Honey Thick Liquid: Not tested   Puree Puree: Impaired Presentation: Spoon Oral Phase Functional Implications: Oral holding;Prolonged oral transit   Solid   GO           Harlon Ditty, MA CCC-SLP [phone]  Saddie Sandeen, Riley Nearing 12/11/2017,10:42 AM

## 2017-12-11 NOTE — Progress Notes (Signed)
  Date: 12/11/2017  Patient name: Meghan CuretMary M Duncan  Medical record number: [id number]  Date of birth: 10/01/1926   I have seen and evaluated this patient and I have discussed the plan of care with the house staff. Please see their note for complete details. I concur with their findings with the following additions/corrections:   84 year old female admitted after being found incidentally to have acute renal failure. Creatinine was 7.9 up from a baseline of 1.5, and today is up to 9. She received fluid challenge overnight and has not responded. Further evaluation has shown a FENA of 0.9, which would suggest prerenal azotemia. Renal ultrasound is unremarkable.[name]. We'll continue fluid resuscitation, still awaiting urinalysis. If she does not begin to improve, we will consult nephrology for further evaluation.  Anne Shutteraines, Alexander N, MD 12/11/2017, 5:47 PM

## 2017-12-11 NOTE — Consult Note (Signed)
[facility] Consult Note     Date: 12/11/2017                  Patient Name:  [name]  MRN: [id number]  DOB: 1926/03/12  Age / Sex: 84 y.o., female         PCP: Thomasene Ripple, MD                 Service Requesting Consult: Internal Medicine Teaching Service, Dr. Rebeca Alert                 Reason for Consult: AKI            Chief Complaint: abnormal labs   HPI: Pt is an 82 F with a PMH significant for HTN, cholelithiasis, Alzheimer's dementia, and who is a NH resident now seen in consultation at the request of Dr. Rebeca Alert for eval and recs surrounding AKI.  Pt's baseline Cr is 1.7 as of 09/2017.  Pt's son noticed that she was not acting like her usual self about 2 days ago.  Went to PCP and got labs with Cr 7.94, BUN in the 90s, Na 129.  Son notes that pt hasn't been eating or drinking well recently.    Medications have included Lasix 80 mg BID and losartan 25 mg daily.    Pt has gotten some fluids with improvement in hyponatremia from 129--> 131 and minimal improvement in Cr.  Renal US without obstruction. We are asked to see.  Pt has no complaints at present.  Not hurting anywhere.  Not able to contribute much history.  Past Medical History:  Diagnosis Date  . Cholelithiasis 01/13/2015  . CKD (chronic kidney disease)   . Diverticulosis of colon   . Elevated alkaline phosphatase level    Chronic; GGT was normal (30) on 09/18/2004.  . Endometrial polyp    S/P operative hysteroscopy with removal of polyps, dilatation and curettage on 10/20/2000 by Dr. Lahoma Crocker.  . Esophageal stricture   . Gastric ulcer 1991   H. pylori positive in 1991; EGD 01/31/00 by Dr. Olevia Perches showed mild benign distal esophageal stricture (dilator passed), gastritis with evidence of coffee-ground material, and a 4 cm non-reducible hiatal hernia; EGD  02/09/2008 showed hiatal hernia, and mild benign distal espohageal stricture, was otherwise normal.  . Gout   . Hiatal hernia   .  Hypertension   . Iron deficiency anemia   . Lower GI bleed   . Malignant neoplasm of colon 1991   S/P right hemicolectomy in 1991 by Dr. Margot Chimes, reportedly done for a malignant polyp; colonoscopy by Dr. Delfin Edis 02/09/2008 showed  marked left colon diverticulosis with partial narrowing, S/P  remote right hemicolectomy.  . Osteopenia    DXA bone density scan on 10/16/2009 showed osteopenia with an AP spine T-score of -1.5, left femoral neck T-score of -1.7, and right femoral neck T-score of -2.0.  Her FRAX score gave an estimated ten-year probability of major osteoporotic fracture of 7.3%, and a ten-year probability of hip fracture of 2.1%.  . Sensorineural hearing loss of both ears    Audiological evaluation on February 05, 2007 showed a slight to mild loss in both ears through 4000 Hz, with a moderate to severe precipitous drop in the highest frequencies (6000 to 8000 Hz).  . Tinnitus, bilateral    Audiological evaluation on February 05, 2007 showed a slight to mild loss of hearing in both ears through 4000 Hz, with a moderate to severe precipitous  drop in the highest frequencies (6000 to 8000 Hz); her speech recognition abilities were within normal limits and a slightly elevated conversational level.  [name] Unsteady gait 08/07/2011  . Vitamin D deficiency    Vitamin D (25-Hydroxy) <10 ng/mL on 12/07/2009.    Past Surgical History:  Procedure Laterality Date  . HEMICOLECTOMY  1991   Right -  with ileocolic anastamosis by Dr. Margot Chimes  . HYSTEROSCOPY     S/P operative hysteroscopy with removal of polyps, dilatation and curettage on 10/20/2000 by Dr. Lahoma Crocker.    Family History  Problem Relation Age of Onset  . Breast cancer Maternal Aunt   . Colon cancer Neg Hx    Social History:  reports that  has never smoked. she has never used smokeless tobacco. She reports that she does not drink alcohol or use drugs.  Allergies: No Known Allergies  Medications Prior to Admission  Medication Sig  Dispense Refill  . allopurinol (ZYLOPRIM) 100 MG tablet Take 2 tablets (200 mg total) daily by mouth. 180 tablet 3  . Calcium Carbonate-Vitamin D 600-400 MG-UNIT tablet Take 1 tablet by mouth 2 (two) times daily. 180 tablet 1  . colchicine 0.6 MG tablet Take 0.5 tablets (0.3 mg total) by mouth daily as needed (For use only during gout flare). 30 tablet 2  . diltiazem (TIAZAC) 420 MG 24 hr capsule TAKE ONE CAPSULE BY MOUTH EVERY DAY (Patient taking differently: TAKE 420 mg CAPSULE BY MOUTH EVERY DAY) 30 capsule 2  . furosemide (LASIX) 80 MG tablet Take 1 tablet (80 mg total) by mouth 2 (two) times daily. 90 tablet 1  . guaifenesin (ROBITUSSIN) 100 MG/5ML syrup Take 200 mg by mouth every 6 (six) hours as needed for cough.    . loperamide (IMODIUM A-D) 2 MG tablet Take 2 mg by mouth 4 (four) times daily as needed for diarrhea or loose stools.    [name] losartan (COZAAR) 25 MG tablet Take 1 tablet (25 mg total) daily by mouth. 90 tablet 3  . magnesium hydroxide (MILK OF MAGNESIA) 400 MG/5ML suspension Take 30 mLs by mouth daily as needed for mild constipation.    . memantine (NAMENDA) 10 MG tablet Take 1 tablet (10 mg total) daily by mouth. 90 tablet 3  . neomycin-bacitracin-polymyxin (NEOSPORIN) ointment Apply 1 application topically as needed for wound care.    . potassium chloride (KLOR-CON 10) 10 MEQ tablet Take 1 tablet (10 mEq total) by mouth daily. 90 tablet 1  . rivastigmine (EXELON) 4.6 mg/24hr PLACE 1 PATCH (4.6 MG TOTAL) ONTO THE SKIN DAILY 90 patch 3    Results for orders placed or performed during the hospital encounter of 12/10/17 (from the past 48 hour(s))  Comprehensive metabolic panel     Status: Abnormal   Collection Time: 12/10/17  9:39 PM  Result Value Ref Range   Sodium 129 (L) 135 - 145 mmol/L   Potassium 3.9 3.5 - 5.1 mmol/L   Chloride 88 (L) 101 - 111 mmol/L   CO2 24 22 - 32 mmol/L   Glucose, Bld 113 (H) 65 - 99 mg/dL   BUN 97 (H) 6 - 20 mg/dL   Creatinine, Ser 8.76 (H) 0.44  - 1.00 mg/dL   Calcium 11.1 (H) 8.9 - 10.3 mg/dL   Total Protein 7.7 6.5 - 8.1 g/dL   Albumin 4.0 3.5 - 5.0 g/dL   AST 21 15 - 41 U/L   ALT 13 (L) 14 - 54 U/L   Alkaline Phosphatase 88 38 -  126 U/L   Total Bilirubin 0.8 0.3 - 1.2 mg/dL   GFR calc non Af Amer 4 (L) >60 mL/min   GFR calc Af Amer 4 (L) >60 mL/min    Comment: (NOTE) The eGFR has been calculated using the CKD EPI equation. This calculation has not been validated in all clinical situations. eGFR's persistently <60 mL/min signify possible Chronic Kidney Disease.    Anion gap 17 (H) 5 - 15  CBC     Status: None   Collection Time: 12/10/17  9:39 PM  Result Value Ref Range   WBC 5.9 4.0 - 10.5 K/uL   RBC 4.45 3.87 - 5.11 MIL/uL   Hemoglobin 13.1 12.0 - 15.0 g/dL   HCT 38.1 36.0 - 46.0 %   MCV 85.6 78.0 - 100.0 fL   MCH 29.4 26.0 - 34.0 pg   MCHC 34.4 30.0 - 36.0 g/dL   RDW 15.3 11.5 - 15.5 %   Platelets 248 150 - 400 K/uL  Renal function panel     Status: Abnormal   Collection Time: 12/10/17 11:53 PM  Result Value Ref Range   Sodium 129 (L) 135 - 145 mmol/L   Potassium 3.7 3.5 - 5.1 mmol/L   Chloride 87 (L) 101 - 111 mmol/L   CO2 27 22 - 32 mmol/L   Glucose, Bld 105 (H) 65 - 99 mg/dL   BUN 101 (H) 6 - 20 mg/dL   Creatinine, Ser 8.95 (H) 0.44 - 1.00 mg/dL   Calcium 10.8 (H) 8.9 - 10.3 mg/dL   Phosphorus 5.8 (H) 2.5 - 4.6 mg/dL   Albumin 3.9 3.5 - 5.0 g/dL   GFR calc non Af Amer 3 (L) >60 mL/min   GFR calc Af Amer 4 (L) >60 mL/min    Comment: (NOTE) The eGFR has been calculated using the CKD EPI equation. This calculation has not been validated in all clinical situations. eGFR's persistently <60 mL/min signify possible Chronic Kidney Disease.    Anion gap 15 5 - 15  Osmolality     Status: Abnormal   Collection Time: 12/10/17 11:53 PM  Result Value Ref Range   Osmolality 313 (H) 275 - 295 mOsm/kg  Renal function panel     Status: Abnormal   Collection Time: 12/11/17  2:58 PM  Result Value Ref Range    Sodium 131 (L) 135 - 145 mmol/L   Potassium 3.8 3.5 - 5.1 mmol/L   Chloride 94 (L) 101 - 111 mmol/L   CO2 23 22 - 32 mmol/L   Glucose, Bld 157 (H) 65 - 99 mg/dL   BUN 97 (H) 6 - 20 mg/dL   Creatinine, Ser 8.71 (H) 0.44 - 1.00 mg/dL   Calcium 9.9 8.9 - 10.3 mg/dL   Phosphorus 5.1 (H) 2.5 - 4.6 mg/dL   Albumin 3.5 3.5 - 5.0 g/dL   GFR calc non Af Amer 4 (L) >60 mL/min   GFR calc Af Amer 4 (L) >60 mL/min    Comment: (NOTE) The eGFR has been calculated using the CKD EPI equation. This calculation has not been validated in all clinical situations. eGFR's persistently <60 mL/min signify possible Chronic Kidney Disease.    Anion gap 14 5 - 15  Sodium, urine, random     Status: None   Collection Time: 12/11/17  3:02 PM  Result Value Ref Range   Sodium, Ur 18 mmol/L  Creatinine, urine, random     Status: None   Collection Time: 12/11/17  3:02 PM  Result Value Ref  Range   Creatinine, Urine 132.44 mg/dL  Osmolality, urine     Status: None   Collection Time: 12/11/17  3:03 PM  Result Value Ref Range   Osmolality, Ur 317 300 - 900 mOsm/kg   US Renal  Result Date: 12/11/2017 CLINICAL DATA:  Renal failure for 1 day. EXAM: RENAL / URINARY TRACT ULTRASOUND COMPLETE COMPARISON:  None. FINDINGS: Right Kidney: Length: 8.8 cm. Increased echogenicity. No mass or hydronephrosis visualized. Left Kidney: Length: 8.6 cm. Increased echogenicity. No mass or hydronephrosis visualized. Benign-appearing left renal cyst measuring 3.1 and 1.4 cm. Bladder: Appears normal for degree of bladder distention. IMPRESSION: Echogenic kidneys consistent with medical renal disease. Left renal cyst. Electronically Signed   By: Fidela Salisbury M.D.   On: 12/11/2017 01:26    ROS: unobtainable due to pt's baseline mental status  Blood pressure 102/68, pulse 72, temperature 98.2 F (36.8 C), temperature source Oral, resp. rate 19, height 5' (1.524 m), SpO2 100 %. Physical Exam  GEN elderly woman, NAD, lying in  bed HEENT dry MM NECK flat neck veins PULM clear bilaterally CV RRR loud S2 ABD soft nontender EXT no LE edema NEURO able to answer simple questions SKIN poor skin turgor  Assessment/Plan  1.  AKI on CKD: this looks like volume depletion on exam.  Renal US without obstruction. Urine lytes collected after 250 mL bolus and IV fluids so won't accurately reflect vol status.  I will re-order fluids with 75 mL/ hr.  Needs isotonic fluids.  She is a very poor candidate for dialysis based on dementia and mainly sedentary status.  If no improvement in labs after a concerted attempt at volume resuscitation will need to bring pall care on board.  2.  Dementia: in a NH.  ON Exelon patch  3.  HTN: off losartan and Lasix and these should not be restarted at present.  I suspect pt doesn't have access to free water in her NH and so the resumption of these medications may not be appropriate.   Madelon Lips, MD [facility] pgr [phone] 12/11/2017, 5:30 PM

## 2017-12-11 NOTE — Progress Notes (Signed)
   Subjective: [name] was seen resting in her bed this morning and stated that she was doing well.   Objective:  Vital signs in last 24 hours: Vitals:   12/10/17 2300 12/10/17 2301 12/10/17 2302 12/11/17 0519  BP: 130/60 132/61 118/79 110/82  Pulse: 85 (!) 105 (!) 115 67  Resp:    19  Temp:    97.9 F (36.6 C)  TempSrc:    Axillary  SpO2: 99%   100%   Physical Exam  Constitutional: She appears well-developed and well-nourished. No distress.  HENT:  Head: Normocephalic and atraumatic.  Eyes: Conjunctivae are normal.  Cardiovascular: Normal rate, regular rhythm, normal heart sounds and intact distal pulses.  Respiratory: Effort normal and breath sounds normal. No respiratory distress. She has no wheezes.  GI: Soft. Bowel sounds are normal. She exhibits no distension. There is tenderness (generalized ).  Musculoskeletal: She exhibits no edema.  Skin: She is not diaphoretic.  Psychiatric: She has a normal mood and affect. Her behavior is normal. Judgment and thought content normal.   Assessment/Plan:  84 y.o f who presented with acute increase in creatinine.   Acute on chronic kidney disease The patient has an acute elevation of her cr from baseline of 1.4-1.7 to 7.94 (12/10/17) and continues to be trending up with last reported cr at 8.95. The patient has received approximately 750mL of fluid thus far. The patient has not produced any urine.   -per FENA=0.9% placing her in pre-renal category -renal ultrasound-echogenic kidneys, left renal cyst  -microalbumin/creatinine pending -orthostatic vital signs pending -nephrology consulted  Essential Hypertension The patient's blood pressure today was normotensive was 110/82.  -Hold lasix, losartan, diltiazem  Hypercalcemia Resolved  Alzheimer's Dementia  -Continue rivastigmine -Continue delirium precautions  Dispo: Anticipated discharge in approximately 2-3 day(s).   Meghan CourierVahini Van Seymore, MD Internal Medicine  PGY1 Pager:[phone] 12/11/2017, 8:49 AM

## 2017-12-11 NOTE — Evaluation (Signed)
Physical Therapy Evaluation Patient Details Name: Meghan CuretMary M Duncan MRN: [id number] DOB: 02/11/1926 Today's Date: 12/11/2017   History of Present Illness  [name] is an 84yo female with PMH significant for Alzheimer's dementia, esophageal stricture in 2014 s/p dilation, CKD, HTN, and colon cancer s/p right hemicolectomy in 1991 who presents as a direct admit for acute elevation in creatinine     Clinical Impression  Pt admitted with above diagnosis. Pt currently with functional limitations due to the deficits listed below (see PT Problem List). History obtained from patients son who was present during session, as she is not a reliable historian. P[facility] with RW and supervision inside room. Upon evaluation, patient AOx0, with mild pain and confusion limiting her mobility at this time. Patient able to follow commands 50% of time and sit to edge of bed for several minutes before wishing to return supine. Pts son reports plan is to return to SNF which I feel is appropriate. Will follow and progress OOB mobility as tolerated.   Pt will benefit from skilled PT to increase their independence and safety with mobility to allow discharge to the venue listed below.       Follow Up Recommendations SNF;Supervision/Assistance - 24 hour    Equipment Recommendations  None recommended by PT    Recommendations for Other Services       Precautions / Restrictions Precautions Precautions: Fall Restrictions Weight Bearing Restrictions: No      Mobility  Bed Mobility Overal bed mobility: Needs Assistance Bed Mobility: Supine to Sit     Supine to sit: Min guard     General bed mobility comments: Pt able to supine to sit with min guard for safety due to dementia. Follows commands for sequencing 50% of time.  Transfers                 General transfer comment: Attempted sit to stand transfers patient confused and denies wishing to stand at this  time.  Ambulation/Gait             General Gait Details: unable  Stairs            Wheelchair Mobility    Modified Rankin (Stroke Patients Only)       Balance Overall balance assessment: Needs assistance Sitting-balance support: Feet unsupported;No upper extremity supported Sitting balance-Leahy Scale: Fair Sitting balance - Comments: tolerated sitting EOB for 5 minutes without support.                                     Pertinent Vitals/Pain      Home Living Family/patient expects to be discharged to:: Skilled nursing facility                      Prior Function Level of Independence: Needs assistance         Comments: Patient was living in SNF prior to admit, per son ambulating short distances with RW and supervision.      Hand Dominance        Extremity/Trunk Assessment   Upper Extremity Assessment Upper Extremity Assessment: Generalized weakness;Difficult to assess due to impaired cognition    Lower Extremity Assessment Lower Extremity Assessment: Generalized weakness;Difficult to assess due to impaired cognition       Communication   Communication: Other (comment)(Patient with advanced dementia)  Cognition Arousal/Alertness: Awake/alert Behavior During Therapy: [facility] for  tasks assessed/performed Overall Cognitive Status: History of cognitive impairments - at baseline                                 General Comments: Pt AOx0, patient with dementia, operating at baseline per son.       General Comments      Exercises     Assessment/Plan    PT Assessment Patient needs continued PT services  PT Problem List Decreased strength;Decreased range of motion;Decreased activity tolerance;Decreased mobility;Decreased balance;Decreased coordination;Decreased cognition;Decreased knowledge of use of DME;Decreased safety awareness;Pain       PT Treatment Interventions DME instruction;Gait  training;Therapeutic activities;Functional mobility training;Stair training;Therapeutic exercise;Balance training    PT Goals (Current goals can be found in the Care Plan section)  Acute Rehab PT Goals Patient Stated Goal: non stated PT Goal Formulation: With patient/family Time For Goal Achievement: 12/18/17 Potential to Achieve Goals: Fair    Frequency Min 2X/week   Barriers to discharge        Co-evaluation               AM-PAC PT "6 Clicks" Daily Activity  Outcome Measure Difficulty turning over in bed (including adjusting bedclothes, sheets and blankets)?: A Little Difficulty moving from lying on back to sitting on the side of the bed? : A Little Difficulty sitting down on and standing up from a chair with arms (e.g., wheelchair, bedside commode, etc,.)?: A Little Help needed moving to and from a bed to chair (including a wheelchair)?: A Little Help needed walking in hospital room?: A Lot Help needed climbing 3-5 steps with a railing? : A Lot 6 Click Score: 16    End of Session   Activity Tolerance: Other (comment)(Patient limited by cognitive deficits and confusion) Patient left: in bed;with call bell/phone within reach;with family/visitor present Nurse Communication: Mobility status PT Visit Diagnosis: Unsteadiness on feet (R26.81);Other abnormalities of gait and mobility (R26.89);Muscle weakness (generalized) (M62.81);Other symptoms and signs involving the nervous system (R29.898);Difficulty in walking, not elsewhere classified (R26.2)    Time: 1610-9604 PT Time Calculation (min) (ACUTE ONLY): 20 min   Charges:   PT Evaluation $PT Eval Moderate Complexity: 1 Mod     PT G Codes:       Etta Grandchild, PT, DPT Acute Rehab Services Pager: [phone]     Etta Grandchild 12/11/2017, 5:26 PM

## 2017-12-12 DIAGNOSIS — K228 Other specified diseases of esophagus: Secondary | ICD-10-CM

## 2017-12-12 DIAGNOSIS — E871 Hypo-osmolality and hyponatremia: Secondary | ICD-10-CM

## 2017-12-12 LAB — BASIC METABOLIC PANEL
Anion gap: 15 (ref 5–15)
BUN: 99 mg/dL — ABNORMAL HIGH (ref 6–20)
CO2: 22 mmol/L (ref 22–32)
Calcium: 9.4 mg/dL (ref 8.9–10.3)
Chloride: 97 mmol/L — ABNORMAL LOW (ref 101–111)
Creatinine, Ser: 8.25 mg/dL — ABNORMAL HIGH (ref 0.44–1.00)
GFR calc Af Amer: 4 mL/min — ABNORMAL LOW (ref >60)
GFR calc non Af Amer: 4 mL/min — ABNORMAL LOW (ref >60)
Glucose, Bld: 83 mg/dL (ref 65–99)
Potassium: 3.5 mmol/L (ref 3.5–5.1)
Sodium: 134 mmol/L — ABNORMAL LOW (ref 135–145)

## 2017-12-12 LAB — MICROALBUMIN / CREATININE URINE RATIO
Creatinine, Urine: 128.4 mg/dL
Microalb Creat Ratio: 53.1 mg/g creat — ABNORMAL HIGH (ref 0.0–30.0)
Microalb, Ur: 68.2 ug/mL — ABNORMAL HIGH

## 2017-12-12 MED ORDER — ENSURE ENLIVE PO LIQD
237.0000 mL | Freq: Three times a day (TID) | ORAL | Status: DC
  Administered 2017-12-12 – 2017-12-21 (×24): 237 mL via ORAL
  Filled 2017-12-12 (×3): qty 237

## 2017-12-12 MED ORDER — SODIUM CHLORIDE 0.9 % IV BOLUS (SEPSIS)
1000.0000 mL | Freq: Once | INTRAVENOUS | Status: AC
  Administered 2017-12-12: 1000 mL via INTRAVENOUS

## 2017-12-12 MED ORDER — SODIUM CHLORIDE 0.9 % IV SOLN
INTRAVENOUS | Status: AC
  Administered 2017-12-12: 19:00:00 via INTRAVENOUS

## 2017-12-12 NOTE — Progress Notes (Signed)
Initial Nutrition Assessment  DOCUMENTATION CODES:   Not applicable  INTERVENTION:    Continue Ensure Enlive po BID, each supplement provides 350 kcal and 20 grams of protein  NUTRITION DIAGNOSIS:   Inadequate oral intake related to (Alzheimer's dementia) as evidenced by meal completion < 25%  GOAL:   Patient will meet greater than or equal to 90% of their needs  MONITOR:   PO intake, Supplement acceptance, Labs, Skin, Weight trends, I & O's  REASON FOR ASSESSMENT:   Malnutrition Screening Tool  ASSESSMENT:   84 yo Female with PMH significant for Alzheimer's dementia, esophageal stricture in 2014 s/p dilation, CKD, HTN, and colon cancer s/p right hemicolectomy in 1991 who presented as a direct admit for acute elevation in creatinine.  Pt confused upon RD visit. Talking to herself. S/p swallow evaluation 1/10. Advanced to Dys 1-thin liquid diet. Meal completion is poor, however, she is drinking Ensure supplements. Labs and medications reviewed. Na 134 (L). CBG's [id number].  NUTRITION - FOCUSED PHYSICAL EXAM:  Unable to complete at this time.  Diet Order:  DIET - DYS 1 Room service appropriate? Yes; Fluid consistency: Thin  EDUCATION NEEDS:   No education needs have been identified at this time  Skin:  Skin Assessment: Reviewed RN Assessment  Last BM:  N/A  Height:   Ht Readings from Last 1 Encounters:  12/11/17 5' (1.524 m)   Weight:   Wt Readings from Last 1 Encounters:  12/12/17 135 lb 9.3 oz (61.5 kg)   Ideal Body Weight:  45.4 kg  BMI:  Body mass index is 26.48 kg/m.  Estimated Nutritional Needs:   Kcal:  1250-1450  Protein:  60-75 gm  Fluid:  >/= 1.5 L  Maureen ChattersKatie Irene Collings, RD, LDN Pager #: [phone] After-Hours Pager #: [phone]

## 2017-12-12 NOTE — Progress Notes (Addendum)
   Subjective: [name] was seen doing well this morning. She states that she does not have any pain when swallowing. Per nursing the patient has some difficulty when ingesting solid foods, but none on liquid. The patient has continued to make minimal urine. We discussed that her kidneys are not working as well as they should, but we have consulted the kidney doctors to help us. She expresses understanding. All questions and concerns addressed.  Objective:  Vital signs in last 24 hours: Vitals:   12/11/17 1209 12/11/17 1330 12/11/17 2131 12/12/17 0519  BP:  102/68 105/60 108/65  Pulse:  72 77 77  Resp:  19 16 16   Temp:  98.2 F (36.8 C) 99.2 F (37.3 C) 98.9 F (37.2 C)  TempSrc:  Oral Oral Oral  SpO2:  100% 99% 98%  Weight:    135 lb 9.3 oz (61.5 kg)  Height: 5' (1.524 m)      Physical Exam  Constitutional: She appears well-developed and well-nourished. No distress.  HENT:  Head: Normocephalic and atraumatic.  Eyes: Conjunctivae are normal.  Cardiovascular: Normal rate, regular rhythm, normal heart sounds and intact distal pulses.  Respiratory:  Difficult to appreciate breath sounds as patient takes shallow breaths, but do not hear any wheezing or rales  GI: Soft. Bowel sounds are normal. She exhibits no distension. There is no tenderness.  Musculoskeletal: She exhibits no edema.  Neurological:  Baseline dementia  Skin: She is not diaphoretic.   Assessment/Plan:  Butch PennyMary Jerrell is an 84 year old female with a history of esophageal stricture status post dilation in 2014 and CKD stage IV who presented for dysphasia and AKI.   Acute on chronic kidney disease - Creatinine trend: 1.5-1.6 (baseline) -> 7.94 -> 8.76 -> 8.95 -> 8.71 ->8.25  - Patient initially oligouric; however, after 1 L normal saline bolus had significant diuresis. I&O's are inaccurate - Renal ultrasound showing hypoechoic kidneys but no hydronephrosis. - FeNa < 1% - Based on available clinical information,  the patient's acute kidney injury is likely secondary to prerenal etiology. It is reassuring that she had significant diuresis after receiving a 1 L normal saline bolus. We will continue to monitor at this point. - Appreciate nephrology's help.  Dysphagia  - Patient has had significant decrease in p.o. intake, and has a history of esophageal strictures with last dilation in 2014. Appears to tolerate thin liquids without difficulty and struggles with more solid food. - Speech has evaluated the patient and recommending a dysphagia 1 diet - We will continue to monitor patient's status. She will likely need referral to GI to determine whether endoscopy should be pursued.  Hyponatremia  - Sodium improved to 134 today  - Urine Osm inaccurate given that she has received IVF - Continue to monitor   Essential Hypertension - Normotensive while in the hospital  - Continuing to hold lasix, losartan, diltiazem  Hypercalcemia - Resolved  - Continue to monitor.  Alzheimer's Dementia - Continue rivastigmine - Continue delirium precautions  Dispo: Anticipated discharge in approximately >1 day(s).   Levora DredgeJustin Juliani Laduke, MD 12/12/2017, 7:03 AM

## 2017-12-12 NOTE — Progress Notes (Signed)
[facility] ROUNDING NOTE   Subjective:   [age] F with a PMH significant for HTN, cholelithiasis, Alzheimer's dementia, and who is a NH resident now seen in consultation at the request of Dr. Sandre Kittyaines for eval and recs surrounding AKI.  Pt's baseline Cr is 1.7 as of 09/2017.  Pt's son noticed that she was not acting like her usual self about 2 days ago.  Went to PCP and got labs with Cr 7.94, BUN in the 90s, Na 129.  Son notes that pt hasn't been eating or drinking well recently.    She reports diarrhea   Renal ultrasound no obstruction  No NSAIDS  Hyponatremia appears to be improving     Objective:  Vital signs in last 24 hours:  Temp:  [98.2 F (36.8 C)-99.2 F (37.3 C)] 98.9 F (37.2 C) (01/11 0519) Pulse Rate:  [72-77] 77 (01/11 0519) Resp:  [16-19] 16 (01/11 0519) BP: (102-108)/(60-68) 108/65 (01/11 0519) SpO2:  [98 %-100 %] 98 % (01/11 0519) Weight:  [135 lb 9.3 oz (61.5 kg)] 135 lb 9.3 oz (61.5 kg) (01/11 0519)  Weight change:  Filed Weights   12/12/17 0519  Weight: 135 lb 9.3 oz (61.5 kg)    Intake/Output: I/O last 3 completed shifts: In: 1303.8 [P.O.:480; I.V.:823.8] Out: 905 [Urine:905]   Intake/Output this shift:  No intake/output data recorded.  Dry mucus membranes noted  CVS- RRR RS- CTA ABD- BS present soft non-distended EXT- no edema   Basic Metabolic Panel: Recent Labs  Lab 12/09/17 1105 12/10/17 2139 12/10/17 2353 12/11/17 1458 12/12/17 0713  NA 134 129* 129* 131* 134*  K 3.9 3.9 3.7 3.8 3.5  CL 86* 88* 87* 94* 97*  CO2 24 24 27 23 22   GLUCOSE 104* 113* 105* 157* 83  BUN 91* 97* 101* 97* 99*  CREATININE 7.94* 8.76* 8.95* 8.71* 8.25*  CALCIUM 11.2* 11.1* 10.8* 9.9 9.4  PHOS  --   --  5.8* 5.1*  --     Liver Function Tests: Recent Labs  Lab 12/09/17 1105 12/10/17 2139 12/10/17 2353 12/11/17 1458  AST 17 21  --   --   ALT 11 13*  --   --   ALKPHOS 99 88  --   --   BILITOT 0.3 0.8  --   --   PROT 7.9 7.7  --   --    ALBUMIN 4.8* 4.0 3.9 3.5   No results for input(s): LIPASE, AMYLASE in the last 168 hours. No results for input(s): AMMONIA in the last 168 hours.  CBC: Recent Labs  Lab 12/10/17 2139  WBC 5.9  HGB 13.1  HCT 38.1  MCV 85.6  PLT 248    Cardiac Enzymes: No results for input(s): CKTOTAL, CKMB, CKMBINDEX, TROPONINI in the last 168 hours.  BNP: Invalid input(s): POCBNP  CBG: No results for input(s): GLUCAP in the last 168 hours.  Microbiology: No results found for this or any previous visit.  Coagulation Studies: No results for input(s): LABPROT, INR in the last 72 hours.  Urinalysis: Recent Labs    12/11/17 1503  COLORURINE YELLOW  LABSPEC 1.011  PHURINE 5.0  GLUCOSEU NEGATIVE  HGBUR SMALL*  BILIRUBINUR NEGATIVE  KETONESUR NEGATIVE  PROTEINUR NEGATIVE  NITRITE NEGATIVE  LEUKOCYTESUR LARGE*      Imaging: [place] Renal  Result Date: 12/11/2017 CLINICAL DATA:  Renal failure for 1 day. EXAM: RENAL / URINARY TRACT ULTRASOUND COMPLETE COMPARISON:  None. FINDINGS: Right Kidney: Length: 8.8 cm. Increased echogenicity. No mass or  hydronephrosis visualized. Left Kidney: Length: 8.6 cm. Increased echogenicity. No mass or hydronephrosis visualized. Benign-appearing left renal cyst measuring 3.1 and 1.4 cm. Bladder: Appears normal for degree of bladder distention. IMPRESSION: Echogenic kidneys consistent with medical renal disease. Left renal cyst. Electronically Signed   By: Ted Mcalpine M.D.   On: 12/11/2017 01:26     Medications:   . sodium chloride    . sodium chloride Stopped (12/12/17 1115)  . sodium chloride 1,000 mL (12/12/17 1115)   . feeding supplement (ENSURE ENLIVE)  237 mL Oral TID BM  . heparin  5,000 Units Subcutaneous Q8H  . rivastigmine  4.6 mg Transdermal Daily  . sodium chloride flush  3 mL Intravenous Q12H   sodium chloride, acetaminophen **OR** acetaminophen, ondansetron **OR** ondansetron (ZOFRAN) IV, senna-docusate, sodium chloride  flush  Assessment/ Plan:  1.  AKI on CKD: this looks like volume depletion on exam.   continue gentle hydration hopefully should improve  May have some ATN   Not a dialysis candidated  2.  Dementia: in a NH.  ON Exelon patch  3.  HTN: controlled  -- no longer taking losartan    LOS: 2 Ojani Berenson W @TODAY @11 :41 AM

## 2017-12-12 NOTE — Progress Notes (Signed)
  Date: 12/12/2017  Patient name: [name]  Medical record number: [id number]  Date of birth: 01/20/1926   I have seen and evaluated this patient and I have discussed the plan of care with the house staff. Please see their note for complete details. I concur with their findings with the following additions/corrections:   Creatinine essentially unchanged this morning after gentle fluid challenge. She has made a small amount of urine, likely approximately 900 mL in the past 24 hours. Appreciate nephrology seen her, and they suggest ongoing fluid challenge as this appears most likely due to hypovolemia. Will give an additional bolus today and continue maintenance fluids and reassess her renal function tomorrow. Unfortunately, due to her dementia, she is not really capable of having a discussion about her current medical problems. In my conversation this morning, she was only able to discuss how she currently felt, but did not seem to register much understanding of her current medical condition. Close communication with her family regarding her condition and goals of care will be important.  In addition, regarding her dysphagia that had been the original reason to present to clinic, SLP has recommended EGD for further evaluation. We will await the determination of what direction her renal function will take before we move forward with any more invasive workup.  Anne Shutteraines, Alexander N, MD 12/12/2017, 6:06 PM

## 2017-12-12 NOTE — Evaluation (Signed)
Occupational Therapy Evaluation Patient Details Name: [name] MRN: [id number] DOB: May 01, 1926 Today's Date: 12/12/2017    History of Present Illness Ms. Rabalais is an 84yo female with PMH significant for Alzheimer's dementia, esophageal stricture in 2014 s/p dilation, CKD, HTN, and colon cancer s/p right hemicolectomy in 1991 who presents as a direct admit for acute elevation in creatinine   Clinical Impression   Pt is Medicare and current D/C plan is SNF. No apparent immediate acute care OT needs, therefore will defer OT to SNF. Pt currently able to complete basic transfers with RW to and from bathroom. Pt is using standard toilet appropriately and should be placed on a toileting program to (A) to bathroom every 2 hours or more if noted in charts for voiding.       Follow Up Recommendations  SNF    Equipment Recommendations  Hospital bed    Recommendations for Other Services       Precautions / Restrictions Precautions Precautions: Fall      Mobility Bed Mobility Overal bed mobility: Needs Assistance Bed Mobility: Supine to Sit     Supine to sit: Min guard     General bed mobility comments: cues for safety . noted ot have smear on bowl on pad and a napkin from lunch tray in bed with stool with what appears she was trying to complete personal hygiene  Transfers Overall transfer level: Needs assistance Equipment used: Rolling walker (2 wheeled) Transfers: Sit to/from Stand Sit to Stand: Min assist              Balance                                           ADL either performed or assessed with clinical judgement   ADL Overall ADL's : Needs assistance/impaired Eating/Feeding: Minimal assistance Eating/Feeding Details (indicate cue type and reason): will need staff (A) to redirect to task Grooming: Minimal assistance Grooming Details (indicate cue type and reason): pt able to wash hands with cues at sink level and wash face      Lower Body Bathing: Moderate assistance           Toilet Transfer: Moderate assistance;Regular Toilet;Grab bars Toilet Transfer Details (indicate cue type and reason): pt needed hand over hand . pt needed cues for hygiene detail but was able to wipe appropriately         Functional mobility during ADLs: Minimal assistance General ADL Comments: pt needed cues to return to RW and not release RW during session     Vision   Additional Comments: pt scanning to locate therapist with name call showing ability to scan environment. pt looking at bed when asked "what color is that blanket" pt response "oh yeah its nice " but never provided a color      Perception     Praxis      Pertinent Vitals/Pain Pain Assessment: No/denies pain     Hand Dominance Right   Extremity/Trunk Assessment Upper Extremity Assessment Upper Extremity Assessment: Generalized weakness   Lower Extremity Assessment Lower Extremity Assessment: Generalized weakness   Cervical / Trunk Assessment Cervical / Trunk Assessment: Kyphotic   Communication Communication Communication: Other (comment)(dementia with some slurred speech)   Cognition Arousal/Alertness: Awake/alert Behavior During Therapy: WFL for tasks assessed/performed Overall Cognitive Status: History of cognitive impairments - at baseline  General Comments: unaware of location or personal need to void .    General Comments       Exercises     Shoulder Instructions      Home Living Family/patient expects to be discharged to:: Skilled nursing facility                                        Prior Functioning/Environment Level of Independence: Needs assistance  Gait / Transfers Assistance Needed: per chart uses RW ADL's / Homemaking Assistance Needed: needs (A) from adl for adls            OT Problem List:        OT Treatment/Interventions:      OT Goals(Current  goals can be found in the care plan section) Acute Rehab OT Goals Patient Stated Goal: i need to get up   OT Frequency:     Barriers to D/C:            Co-evaluation              AM-PAC PT "6 Clicks" Daily Activity     Outcome Measure Help from another person eating meals?: A Little Help from another person taking care of personal grooming?: A Lot Help from another person toileting, which includes using toliet, bedpan, or urinal?: A Lot Help from another person bathing (including washing, rinsing, drying)?: A Lot Help from another person to put on and taking off regular upper body clothing?: A Lot Help from another person to put on and taking off regular lower body clothing?: A Lot 6 Click Score: 13   End of Session Equipment Utilized During Treatment: Gait belt;Rolling walker Nurse Communication: Mobility status;Precautions  Activity Tolerance: Patient tolerated treatment well Patient left: in chair;with call bell/phone within reach;with nursing/sitter in room                   Time: [id number] OT Time Calculation (min): 24 min Charges:  OT General Charges $OT Visit: 1 Visit OT Evaluation $OT Eval Moderate Complexity: 1 Mod G-Codes:     Mateo FlowJones, Brynn   OTR/L Pager: [phone] Office: [phone] .   Boone MasterJones, Seini Lannom B 12/12/2017, 4:57 PM

## 2017-12-13 LAB — RENAL FUNCTION PANEL
Albumin: 3.3 g/dL — ABNORMAL LOW (ref 3.5–5.0)
Anion gap: 15 (ref 5–15)
BUN: 85 mg/dL — ABNORMAL HIGH (ref 6–20)
CO2: 17 mmol/L — ABNORMAL LOW (ref 22–32)
Calcium: 8.6 mg/dL — ABNORMAL LOW (ref 8.9–10.3)
Chloride: 105 mmol/L (ref 101–111)
Creatinine, Ser: 6.34 mg/dL — ABNORMAL HIGH (ref 0.44–1.00)
GFR calc Af Amer: 6 mL/min — ABNORMAL LOW (ref >60)
GFR calc non Af Amer: 5 mL/min — ABNORMAL LOW (ref >60)
Glucose, Bld: 80 mg/dL (ref 65–99)
Phosphorus: 3.6 mg/dL (ref 2.5–4.6)
Potassium: 3.5 mmol/L (ref 3.5–5.1)
Sodium: 137 mmol/L (ref 135–145)

## 2017-12-13 MED ORDER — SODIUM CHLORIDE 0.9 % IV SOLN
INTRAVENOUS | Status: AC
  Administered 2017-12-13: 13:00:00 via INTRAVENOUS

## 2017-12-13 NOTE — Progress Notes (Signed)
[facility] ROUNDING NOTE   Subjective:   [age] F with a PMH significant for HTN, cholelithiasis, Alzheimer's dementia, and who is a NH resident now seen in consultation at the request of Dr. Sandre Kittyaines for eval and recs surrounding AKI.  Pt's baseline Cr is 1.7 as of 09/2017.  Pt's son noticed that she was not acting like her usual self about 2 days ago.  Went to PCP and got labs with Cr 7.94, BUN in the 90s, Na 129.  Son notes that pt hasn't been eating or drinking well recently.    She reports diarrhea   Renal ultrasound no obstruction  No NSAIDS  Hyponatremia appears to be improving     Objective:  Vital signs in last 24 hours:  Temp:  [96.6 F (35.9 C)-98.3 F (36.8 C)] 97.8 F (36.6 C) (01/12 0510) Pulse Rate:  [73-77] 73 (01/12 0510) Resp:  [16] 16 (01/11 1542) BP: (117-136)/(72-88) 136/72 (01/12 0510) SpO2:  [99 %-100 %] 99 % (01/12 0510)  Weight change:  Filed Weights   12/12/17 0519  Weight: 135 lb 9.3 oz (61.5 kg)    Intake/Output: I/O last 3 completed shifts: In: 2722.5 [I.V.:2722.5] Out: 405 [Urine:405]   Intake/Output this shift:  No intake/output data recorded.  Dry mucus membranes noted  CVS- RRR RS- CTA ABD- BS present soft non-distended EXT- no edema   Basic Metabolic Panel: Recent Labs  Lab 12/10/17 2139 12/10/17 2353 12/11/17 1458 12/12/17 0713 12/13/17 0749  NA 129* 129* 131* 134* 137  K 3.9 3.7 3.8 3.5 3.5  CL 88* 87* 94* 97* 105  CO2 24 27 23 22  17*  GLUCOSE 113* 105* 157* 83 80  BUN 97* 101* 97* 99* 85*  CREATININE 8.76* 8.95* 8.71* 8.25* 6.34*  CALCIUM 11.1* 10.8* 9.9 9.4 8.6*  PHOS  --  5.8* 5.1*  --  3.6    Liver Function Tests: Recent Labs  Lab 12/09/17 1105 12/10/17 2139 12/10/17 2353 12/11/17 1458 12/13/17 0749  AST 17 21  --   --   --   ALT 11 13*  --   --   --   ALKPHOS 99 88  --   --   --   BILITOT 0.3 0.8  --   --   --   PROT 7.9 7.7  --   --   --   ALBUMIN 4.8* 4.0 3.9 3.5 3.3*   No results for  input(s): LIPASE, AMYLASE in the last 168 hours. No results for input(s): AMMONIA in the last 168 hours.  CBC: Recent Labs  Lab 12/10/17 2139  WBC 5.9  HGB 13.1  HCT 38.1  MCV 85.6  PLT 248    Cardiac Enzymes: No results for input(s): CKTOTAL, CKMB, CKMBINDEX, TROPONINI in the last 168 hours.  BNP: Invalid input(s): POCBNP  CBG: No results for input(s): GLUCAP in the last 168 hours.  Microbiology: No results found for this or any previous visit.  Coagulation Studies: No results for input(s): LABPROT, INR in the last 72 hours.  Urinalysis: Recent Labs    12/11/17 1503  COLORURINE YELLOW  LABSPEC 1.011  PHURINE 5.0  GLUCOSEU NEGATIVE  HGBUR SMALL*  BILIRUBINUR NEGATIVE  KETONESUR NEGATIVE  PROTEINUR NEGATIVE  NITRITE NEGATIVE  LEUKOCYTESUR LARGE*      Imaging: No results found.   Medications:   . sodium chloride     . feeding supplement (ENSURE ENLIVE)  237 mL Oral TID BM  . heparin  5,000 Units Subcutaneous Q8H  .  rivastigmine  4.6 mg Transdermal Daily  . sodium chloride flush  3 mL Intravenous Q12H   sodium chloride, acetaminophen **OR** acetaminophen, ondansetron **OR** ondansetron (ZOFRAN) IV, senna-docusate, sodium chloride flush  Assessment/ Plan:  1.  AKI on CKD: this looks like volume depletion on exam.   continue gentle hydration hopefully should improve  May have some ATN   Not a dialysis candidated  Creatinine slightly decresed   2.  Dementia: in a NH.  ON Exelon patch  3.  HTN: controlled  -- no longer taking losartan    LOS: 3 Mry Lamia W @TODAY @11 :05 AM

## 2017-12-13 NOTE — Plan of Care (Signed)
  Clinical Measurements: Will remain free from infection 12/13/2017 1208 - Progressing by Darrow BussingArcilla, Jameria Bradway M, RN   Activity: Risk for activity intolerance will decrease 12/13/2017 1208 - Progressing by Darrow BussingArcilla, Rafiq Bucklin M, RN   Nutrition: Adequate nutrition will be maintained 12/13/2017 1208 - Progressing by Darrow BussingArcilla, Rashod Gougeon M, RN   Skin Integrity: Risk for impaired skin integrity will decrease 12/13/2017 1208 - Progressing by Darrow BussingArcilla, Cristo Ausburn M, RN

## 2017-12-13 NOTE — Progress Notes (Signed)
  Date: 12/13/2017  Patient name: Trinidad CuretMary M Danese  Medical record number: [id number]  Date of birth: 01/27/1926   I have seen and evaluated this patient and I have discussed the plan of care with the house staff. Please see their note for complete details. I concur with their findings with the following additions/corrections:   Creatinine slightly improved today after ongoing fluid hydration. Hopefully this was all prerenal azotemia due to hypovolemia, although may have a component of ATN as well. We will continue gentle hydration and trending her renal function and electrolytes.  For her dysphagia, she should have this reevaluated as an outpatient once her renal function normalizes.  Anne Shutteraines, Alexander N, MD 12/13/2017, 5:53 PM

## 2017-12-13 NOTE — Progress Notes (Signed)
   Subjective: [name] was seen doing well this morning. She was drinking an Ensure shake with her son at bedside. She was pleasant and had no complaints. RN notes good UOP.   Objective:  Vital signs in last 24 hours: Vitals:   12/12/17 2126 12/12/17 2238 12/13/17 0510 12/13/17 1152  BP:   136/72 136/84  Pulse:   73 74  Resp:    16  Temp: (!) 97 F (36.1 C) 98.3 F (36.8 C) 97.8 F (36.6 C) 97.6 F (36.4 C)  TempSrc: Oral Oral Axillary Oral  SpO2:   99% 100%  Weight:      Height:       Physical Exam  Constitutional: She appears well-developed and well-nourished. No distress.  HENT:  Head: Normocephalic and atraumatic.  Eyes: Conjunctivae are normal.  Cardiovascular: Normal rate, regular rhythm, normal heart sounds and intact distal pulses.  Respiratory:  Difficult to appreciate breath sounds as patient takes shallow breaths, but do not hear any wheezing or rales  GI: Soft. Bowel sounds are normal. She exhibits no distension. There is no tenderness.  Musculoskeletal: She exhibits no edema.  Neurological:  Baseline dementia  Skin: She is not diaphoretic.   Assessment/Plan: Butch PennyMary Jerrell is an 84 year old female with a history of esophageal stricture status post dilation in 2014 and CKD stage IV who was found to have AKI while undergoing outpatient work-up for dysphagia.   Acute on chronic kidney disease, likely dehydration Cr improved today after fluid challenge. Likely from dehydration and poor PO intake secondary to her dementia and frail baseline. CR seems to hover around 1.6 or so at baseline and is ~6 today. Seen by nephrology who note this seems most likely to be volume depletion and note she is not a dialysis candidate which we agree with as well. Will continue gentle rehydration and continue to monitor. As long as her Cr continues to trend in the right direction, she may be ready for discharge tomorrow with outpatient follow-up.  -Initially oliguric however improved  after 1L bolus -Will continue gentle fluid replacement today -Could be dc tomorrow should renal function continue to trend in right direction -She is not felt to be a candidate for HD  Dysphagia  Patient has had significant decrease in PO intake secondary to her dementia and dysphagia. She tolerates liquids well however has issues with solid food. Currently receiving 3 Ensure shakes per day and doing well. SLP recs dysphagia 1 diet. She can continue her outpatient work-up including outpatient GI referral should this fit with her goals of care.   Essential Hypertension Remains normotensive.  - Continuing to hold lasix, losartan, diltiazem  Alzheimer's Dementia - Continue rivastigmine - Continue delirium precautions  Dispo: Anticipated discharge tomorrow.  Nastacia Raybuck, DO 12/13/2017, 12:54 PM

## 2017-12-14 LAB — BASIC METABOLIC PANEL
Anion gap: 11 (ref 5–15)
BUN: 76 mg/dL — ABNORMAL HIGH (ref 6–20)
CO2: 22 mmol/L (ref 22–32)
Calcium: 8.8 mg/dL — ABNORMAL LOW (ref 8.9–10.3)
Chloride: 110 mmol/L (ref 101–111)
Creatinine, Ser: 5.79 mg/dL — ABNORMAL HIGH (ref 0.44–1.00)
GFR calc Af Amer: 7 mL/min — ABNORMAL LOW (ref >60)
GFR calc non Af Amer: 6 mL/min — ABNORMAL LOW (ref >60)
Glucose, Bld: 85 mg/dL (ref 65–99)
Potassium: 3.7 mmol/L (ref 3.5–5.1)
Sodium: 143 mmol/L (ref 135–145)

## 2017-12-14 MED ORDER — SODIUM CHLORIDE 0.9 % IV BOLUS (SEPSIS)
500.0000 mL | Freq: Once | INTRAVENOUS | Status: AC
  Administered 2017-12-14: 500 mL via INTRAVENOUS

## 2017-12-14 MED ORDER — SODIUM CHLORIDE 0.9 % IV SOLN
INTRAVENOUS | Status: AC
  Administered 2017-12-14 (×2): via INTRAVENOUS

## 2017-12-14 NOTE — Plan of Care (Signed)
  Clinical Measurements: Will remain free from infection 12/14/2017 0910 - Progressing by Darrow BussingArcilla, Larell Baney M, RN   Nutrition: Adequate nutrition will be maintained 12/14/2017 0910 - Progressing by Darrow BussingArcilla, Swetha Rayle M, RN   Pain Managment: General experience of comfort will improve 12/14/2017 0910 - Progressing by Darrow BussingArcilla, Dylen Mcelhannon M, RN   Safety: Ability to remain free from injury will improve 12/14/2017 0910 - Progressing by Darrow BussingArcilla, Malikye Reppond M, RN

## 2017-12-14 NOTE — Progress Notes (Signed)
  Date: 12/14/2017  Patient name: [name]  Medical record number: [id number]  Date of birth: 07/21/1926   I have seen and evaluated this patient and I have discussed the plan of care with the house staff. Please see their note for complete details. I concur with their findings with the following additions/corrections:   Kidney function continues to improve slowly, although possibly plateauing a bit today. We will continue careful hydration, although her intake appears to be severely limited. She really only drinks when prompted by family or nursing staff and she eats very little. I fear her overall prognosis is quite poor.  Anne Shutteraines, Alexander N, MD 12/14/2017, 2:45 PM

## 2017-12-14 NOTE — Progress Notes (Addendum)
   Subjective: [name] was seen laying in her bed this morning. She was pleasant and stated that she is unable to swallow her breakfast as the pieces are too large.   Objective:  Vital signs in last 24 hours: Vitals:   12/13/17 1152 12/13/17 1300 12/13/17 2052 12/14/17 0552  BP: 136/84 129/79 135/86 103/80  Pulse: 74 74 77 78  Resp: 16 16    Temp: 97.6 F (36.4 C) 97.7 F (36.5 C) 97.7 F (36.5 C) 98.3 F (36.8 C)  TempSrc: Oral Oral Axillary Axillary  SpO2: 100% 100% 100% 94%  Weight:      Height:       Physical Exam  Constitutional: She appears well-developed and well-nourished. No distress.  HENT:  Head: Normocephalic and atraumatic.  Eyes: Conjunctivae are normal.  Cardiovascular: Normal rate, regular rhythm, normal heart sounds and intact distal pulses.  Respiratory: Effort normal and breath sounds normal. No respiratory distress. She has no wheezes.  GI: Soft. Bowel sounds are normal. She exhibits no distension. There is no tenderness.  Musculoskeletal: She exhibits no edema (no pitting visualized).  Neurological:  Dementia at baseline  Skin: No rash noted. She is not diaphoretic. No erythema.  Psychiatric: She has a normal mood and affect. Her behavior is normal. Judgment and thought content normal.    Assessment/Plan: [name] is an 84 year old female with a history of esophageal stricture status post dilation in 2014 and CKD stage IV who was found to have AKI while undergoing outpatient work-up for dysphagia.   Acute on chronic kidney disease, likely dehydration AKI is thought to be due to pre-renal volume depletion. The patient's creatinine today is 5.79 from prior 6.34.  -Normal saline 500mL bolus over 2 hrs -Continue normal saline at 13700mL/hr nfor 16 hrs  Dysphagia  The patient is able to tolerate fluids well and is having difficulty with solids.   -Outpatient GI referral -Continue dysphagia 1 diet -Will get SLP to evaluate tomorrow  1/14  Essential Hypertension The patient's blood pressure this morning was 103/80.   - Continuing to hold lasix, losartan, diltiazem  Alzheimer's Dementia - Continue rivastigmine - Continue delirium precautions   Dispo: Anticipated discharge in approximately 0-1 day(s).   Lorenso CourierVahini Lurlie Wigen, MD Internal Medicine PGY1 Pager:[phone] 12/14/2017, 7:25 AM

## 2017-12-14 NOTE — Progress Notes (Signed)
[facility] ROUNDING NOTE   Subjective:   [age] F with a PMH significant for HTN, cholelithiasis, Alzheimer's dementia, and who is a NH resident now seen in consultation at the request of Dr. Sandre Kittyaines for eval and recs surrounding AKI.  Pt's baseline Cr is 1.7 as of 09/2017.  Pt's son noticed that she was not acting like her usual self about 2 days ago.  Went to PCP and got labs with Cr 7.94, BUN in the 90s, Na 129.  Son notes that pt hasn't been eating or drinking well recently.    She reports diarrhea   Renal ultrasound no obstruction  No NSAIDS  Hyponatremia appears to be improving     Objective:  Vital signs in last 24 hours:  Temp:  [97.6 F (36.4 C)-98.3 F (36.8 C)] 98.3 F (36.8 C) (01/13 0552) Pulse Rate:  [74-78] 78 (01/13 0552) Resp:  [16] 16 (01/12 1300) BP: (103-136)/(79-86) 103/80 (01/13 0552) SpO2:  [94 %-100 %] 94 % (01/13 0552)  Weight change:  Filed Weights   12/12/17 0519  Weight: 135 lb 9.3 oz (61.5 kg)    Intake/Output: I/O last 3 completed shifts: In: 1345 [I.V.:1345] Out: 550 [Urine:550]   Intake/Output this shift:  No intake/output data recorded.  Dry mucus membranes noted  CVS- RRR RS- CTA ABD- BS present soft non-distended EXT- no edema   Basic Metabolic Panel: Recent Labs  Lab 12/10/17 2353 12/11/17 1458 12/12/17 0713 12/13/17 0749 12/14/17 0634  NA 129* 131* 134* 137 143  K 3.7 3.8 3.5 3.5 3.7  CL 87* 94* 97* 105 110  CO2 27 23 22  17* 22  GLUCOSE 105* 157* 83 80 85  BUN 101* 97* 99* 85* 76*  CREATININE 8.95* 8.71* 8.25* 6.34* 5.79*  CALCIUM 10.8* 9.9 9.4 8.6* 8.8*  PHOS 5.8* 5.1*  --  3.6  --     Liver Function Tests: Recent Labs  Lab 12/09/17 1105 12/10/17 2139 12/10/17 2353 12/11/17 1458 12/13/17 0749  AST 17 21  --   --   --   ALT 11 13*  --   --   --   ALKPHOS 99 88  --   --   --   BILITOT 0.3 0.8  --   --   --   PROT 7.9 7.7  --   --   --   ALBUMIN 4.8* 4.0 3.9 3.5 3.3*   No results for  input(s): LIPASE, AMYLASE in the last 168 hours. No results for input(s): AMMONIA in the last 168 hours.  CBC: Recent Labs  Lab 12/10/17 2139  WBC 5.9  HGB 13.1  HCT 38.1  MCV 85.6  PLT 248    Cardiac Enzymes: No results for input(s): CKTOTAL, CKMB, CKMBINDEX, TROPONINI in the last 168 hours.  BNP: Invalid input(s): POCBNP  CBG: No results for input(s): GLUCAP in the last 168 hours.  Microbiology: No results found for this or any previous visit.  Coagulation Studies: No results for input(s): LABPROT, INR in the last 72 hours.  Urinalysis: Recent Labs    12/11/17 1503  COLORURINE YELLOW  LABSPEC 1.011  PHURINE 5.0  GLUCOSEU NEGATIVE  HGBUR SMALL*  BILIRUBINUR NEGATIVE  KETONESUR NEGATIVE  PROTEINUR NEGATIVE  NITRITE NEGATIVE  LEUKOCYTESUR LARGE*      Imaging: No results found.   Medications:   . sodium chloride    . sodium chloride     . feeding supplement (ENSURE ENLIVE)  237 mL Oral TID BM  . heparin  5,000 Units Subcutaneous Q8H  . rivastigmine  4.6 mg Transdermal Daily  . sodium chloride flush  3 mL Intravenous Q12H   sodium chloride, acetaminophen **OR** acetaminophen, ondansetron **OR** ondansetron (ZOFRAN) IV, senna-docusate, sodium chloride flush  Assessment/ Plan:  1.  AKI on CKD: this looks like volume depletion on exam.   continue gentle hydration hopefully should improve  May have some ATN   Not a dialysis candidated  Creatinine slightly decreased   2.  Dementia: in a NH.  ON Exelon patch  3.  HTN: controlled  -- no longer taking losartan  Continues to do well with hydration  Creatinine is improved and no edema, urine output is very good    LOS: 4 Ikram Riebe W @TODAY @10 :03 AM

## 2017-12-15 DIAGNOSIS — N179 Acute kidney failure, unspecified: Principal | ICD-10-CM

## 2017-12-15 DIAGNOSIS — N184 Chronic kidney disease, stage 4 (severe): Secondary | ICD-10-CM

## 2017-12-15 DIAGNOSIS — F0280 Dementia in other diseases classified elsewhere without behavioral disturbance: Secondary | ICD-10-CM

## 2017-12-15 DIAGNOSIS — I5030 Unspecified diastolic (congestive) heart failure: Secondary | ICD-10-CM

## 2017-12-15 DIAGNOSIS — R1310 Dysphagia, unspecified: Secondary | ICD-10-CM

## 2017-12-15 DIAGNOSIS — I130 Hypertensive heart and chronic kidney disease with heart failure and stage 1 through stage 4 chronic kidney disease, or unspecified chronic kidney disease: Secondary | ICD-10-CM

## 2017-12-15 LAB — BASIC METABOLIC PANEL
Anion gap: 15 (ref 5–15)
BUN: 78 mg/dL — ABNORMAL HIGH (ref 6–20)
CO2: 19 mmol/L — ABNORMAL LOW (ref 22–32)
Calcium: 8.8 mg/dL — ABNORMAL LOW (ref 8.9–10.3)
Chloride: 111 mmol/L (ref 101–111)
Creatinine, Ser: 4.85 mg/dL — ABNORMAL HIGH (ref 0.44–1.00)
GFR calc Af Amer: 8 mL/min — ABNORMAL LOW (ref >60)
GFR calc non Af Amer: 7 mL/min — ABNORMAL LOW (ref >60)
Glucose, Bld: 86 mg/dL (ref 65–99)
Potassium: 3.9 mmol/L (ref 3.5–5.1)
Sodium: 145 mmol/L (ref 135–145)

## 2017-12-15 MED ORDER — LABETALOL HCL 5 MG/ML IV SOLN
5.0000 mg | INTRAVENOUS | Status: DC | PRN
  Filled 2017-12-15: qty 4

## 2017-12-15 MED ORDER — SODIUM CHLORIDE 0.9 % IV SOLN
INTRAVENOUS | Status: AC
  Administered 2017-12-15: 14:00:00 via INTRAVENOUS

## 2017-12-15 NOTE — Progress Notes (Signed)
Paged by nursing regarding patients O2 saturation and BP (139/122). O2 sat 88% on RA. Patient is refusing to wear the [place]. Went to bedside. Patient is redirectable and agreeable to put the [place] back on, but once it is on states it hurts and refuses to wear it. Respiratory exam with diminished breath sounds at the bases, no rales or crackles appreciated (pt not cooperative and does not take deep breaths), no lower extremity edema.   -Discussed with nursing to continue encouragement and redirection for supplemental oxygen -Added PRN labetalol 5 mg q2 hours for BP >180/120  Juluis MireSam Evy Lutterman, MD Internal Medicine PGY1

## 2017-12-15 NOTE — Progress Notes (Signed)
Received call from bedside RN about patient having "pink sputum". I asked RN to assess lung sounds, obtain vitals signs, and call RT while I was with a medical emergency. RN also informed that patient has not been wearing oxygen (A.Dementia) and BP was 139/122 ?[name].   I called RT and asked if they could assess patient, I spoke with the RT, sats were 88%, patient was placed 2L [place], per RT, lungs were clear-diminished and RN did call IMTS and they came to bedside to assess the patient.   Patient had been drinking a strawberry ensure earlier.  I did ask that patient have supervision with POs tonight.  IMTS did order PRN hydralazine if needed.   I called for follow ups at 200, 445, 550. Per RN, patient resting comfortably and sats maintained on 2L.  Call RRT if needed  Call Time 0049 End Time [phone]

## 2017-12-15 NOTE — Progress Notes (Signed)
   Subjective: [name] was seen resting in her bed this morning. [name] states that [name] is breathing well and does not have any sob.   Overnight the patient was noted to have desaturated down to 88% and was placed on nasal cannula. The patient was seen being comfortable today and not needing nasal cannula currently.   Objective:  Vital signs in last 24 hours: Vitals:   12/14/17 1314 12/14/17 2259 12/15/17 0101 12/15/17 0110  BP: 104/80 109/81 (!) 139/122   Pulse: (!) 51 73 78 95  Resp: 16 18 20 20   Temp: 98 F (36.7 C) 98.5 F (36.9 C) 98 F (36.7 C)   TempSrc: Axillary Axillary Axillary   SpO2: (!) 84% 100% (!) 88% 100%  Weight:      Height:       Physical Exam  Constitutional: [name] appears well-developed and well-nourished. No distress.  HENT:  Head: Normocephalic and atraumatic.  Eyes: Conjunctivae are normal.  Cardiovascular: Normal rate, regular rhythm, normal heart sounds and intact distal pulses.  Respiratory: Effort normal and breath sounds normal. No respiratory distress. [name] has no wheezes.  GI: Soft. Bowel sounds are normal. [name] exhibits no distension. There is no tenderness.  Musculoskeletal: [name] exhibits no edema.  Neurological: [name] is alert.  Skin: [name] is not diaphoretic.  Psychiatric: [name] has a normal mood and affect. Her behavior is normal. Judgment and thought content normal.   Assessment/Plan:  [name] is an 84 year old female with a history of esophageal stricture status post dilation in 2014 and CKD stage IV whowas found to have AKI while undergoing outpatient work-up for dysphagia.  Acute on chronic kidney disease, likely dehydration AKI is thought to be due to pre-renal volume depletion. The patient's creatinine today is 4.85 from prior 5.79. The patient will likely benefit from continued fluid repletion. Will continue gentle hydration and encouraging the patient to have more oral intake.   -Normal saline 12900ml/hr for 10hrs -Recheck bmp am  12/16/17  Dysphagia The patient is able to tolerate fluids well and is having difficulty with solids. The patient has been vomiting up her food with even a few spoons.   -Outpatient GI referral -Continue dysphagia 1 diet -SLP re-evaluation  Essential Hypertension The patient's blood pressure this morning was 140/90  -Continuing to hold lasix, losartan, diltiazem  Alzheimer's Dementia - Continue rivastigmine - Continue delirium precautions   Dispo: Anticipated discharge in approximately 1-2 day(s).   Lorenso CourierVahini Grabiel Schmutz, MD Internal Medicine PGY1 Pager:[phone] 12/15/2017, 10:35 AM

## 2017-12-15 NOTE — Progress Notes (Signed)
RN called Rapid Response and Respiratory.

## 2017-12-15 NOTE — Discharge Summary (Signed)
Name: [name] MRN: [id number] DOB: 1926/09/04 84 y.o. PCP: Rozann Lesches, MD  Date of Admission: 12/10/2017  8:47 PM Date of Discharge: 12/21/17 Attending Physician: Earl Lagos, MD  Discharge Diagnosis:  Acute on chronic kidney injury secondary to volume depletion  Dysphagia secondary to esophageal dysmotility Essential hypertension CKD (chronic kidney disease) Dementia of the Alzheimer's type Difficulty swallowing  Discharge Medications: Allergies as of 12/20/2017   No Known Allergies     Medication List    STOP taking these medications   allopurinol 100 MG tablet Commonly known as:  ZYLOPRIM   Calcium Carbonate-Vitamin D 600-400 MG-UNIT tablet   colchicine 0.6 MG tablet     TAKE these medications   diltiazem 420 MG 24 hr capsule Commonly known as:  TIAZAC TAKE ONE CAPSULE BY MOUTH EVERY DAY What changed:    how much to take  how to take this  when to take this   feeding supplement (ENSURE ENLIVE) Liqd Take 237 mLs by mouth 3 (three) times daily between meals.   furosemide 80 MG tablet Commonly known as:  LASIX Take 1 tablet (80 mg total) by mouth 2 (two) times daily.   guaifenesin 100 MG/5ML syrup Commonly known as:  ROBITUSSIN Take 200 mg by mouth every 6 (six) hours as needed for cough.   loperamide 2 MG tablet Commonly known as:  IMODIUM A-D Take 2 mg by mouth 4 (four) times daily as needed for diarrhea or loose stools.   losartan 25 MG tablet Commonly known as:  COZAAR Take 1 tablet (25 mg total) daily by mouth.   magnesium hydroxide 400 MG/5ML suspension Commonly known as:  MILK OF MAGNESIA Take 30 mLs by mouth daily as needed for mild constipation.   memantine 10 MG tablet Commonly known as:  NAMENDA Take 1 tablet (10 mg total) daily by mouth.   neomycin-bacitracin-polymyxin ointment Commonly known as:  NEOSPORIN Apply 1 application topically as needed for wound care.   potassium chloride 10 MEQ tablet Commonly known  as:  KLOR-CON 10 Take 1 tablet (10 mEq total) by mouth daily.   rivastigmine 4.6 mg/24hr Commonly known as:  EXELON PLACE 1 PATCH (4.6 MG TOTAL) ONTO THE SKIN DAILY       Disposition and follow-up:   MeghanAnglea COPPER Duncan was discharged from [facility] in stable condition.  At the hospital follow up visit please address:  1.  Dysphagia: The patient does not want to give the patient IV fluids  Acute on chronic kidney injury: thought to be due to poor oral intake  2.  Labs / imaging needed at time of follow-up: none  3.  Pending labs/ test needing follow-up: none  Follow-up Appointments:   Hospital Course by problem list:   Dysphagia secondary to esophageal dysmotility and hiatal hernia [name] was brought to [facility] for difficulty swallowing, decreased po intake, and a 15lb weight loss since October 2018. The patient has had a dilation done for an esophageal stricture in 2014. Ultrasound of the esophagus showed large hiatal hernia, contractions in the distal esopagus with corkscrew appearance, diffuse esophageal spasm. During the hospitalization the patient was evaluated by SLP who placed the patient on dysphagia 2 diet. However, the patient continued to have a decreased oral intake and would mostly consume Ensure shakes. The patient was placed on IV fluids for supportive therapy. Upon goals of care discussion the patient's family has opted against IV fluid  therapy.   Acute on chronic kidney injury The patient was found to have a cr of 7.94 on bmp during evaluation in clinic. The patient was given a fluid challenge to which she responded and therefore thought to be fluid depleted. The patient was maintained on fluids throughout the admission and creatinine continued to improve. The last creatinine noted during hospitalization was 4.18 on 12/18/17.  Goals of Care Palliative care team spoke with patient and  her family members. The patient is to be DNR, without any IV fluids, and to not be brought back to hospital. She should get palliative prophylaxis.  Discharge Vitals:   BP 94/72 (BP Location: Right Arm)   Pulse 74   Temp 97.9 F (36.6 C) (Axillary)   Resp 16   Ht 5' (1.524 m)   Wt 137 lb 12.6 oz (62.5 kg)   SpO2 99%   BMI 26.91 kg/m   Pertinent Labs, Studies, and Procedures:   BMP Latest Ref Rng & Units 12/18/2017 12/18/2017 12/17/2017  Glucose 65 - 99 mg/dL 098(J117(H) 191(Y115(H) 82  BUN 6 - 20 mg/dL 78(G90(H) 95(A89(H) 21(H90(H)  Creatinine 0.44 - 1.00 mg/dL 0.86(V4.23(H) 7.84(O4.18(H) 9.62(X4.80(H)  BUN/Creat Ratio 12 - 28 - - -  Sodium 135 - 145 mmol/L 143 144 150(H)  Potassium 3.5 - 5.1 mmol/L 4.2 4.1 4.0  Chloride 101 - 111 mmol/L 113(H) 114(H) 116(H)  CO2 22 - 32 mmol/L 19(L) 18(L) 19(L)  Calcium 8.9 - 10.3 mg/dL 8.1(L) 8.1(L) 8.6(L)   CBC    Component Value Date/Time   WBC 5.9 12/10/2017 2139   RBC 4.45 12/10/2017 2139   HGB 13.1 12/10/2017 2139   HCT 38.1 12/10/2017 2139   HCT 35.1 01/11/2015 1127   PLT 248 12/10/2017 2139   MCV 85.6 12/10/2017 2139   MCH 29.4 12/10/2017 2139   MCHC 34.4 12/10/2017 2139   RDW 15.3 12/10/2017 2139   LYMPHSABS 1.5 04/11/2015 1116   MONOABS 0.6 04/11/2015 1116   EOSABS 0.0 04/11/2015 1116   BASOSABS 0.0 04/11/2015 1116   US Renal (12/11/17): Echogenic kidneys consistent with medical renal disease.  Left renal cyst.  DG Esophagus (12/16/17): 1. Limited evaluation of the esophagus only due to patient's refusal to swallow a significant amount of oral contrast. 2. Prominent tertiary contractions within the distal esophagus, which has a slightly corkscrew appearance, as can be seen with diffuse esophageal spasm or nonspecific esophageal motility disorder. 3. Large hiatal hernia.  Discharge Instructions: Discharge Instructions    Diet - low sodium heart healthy   Complete by:  As directed    Increase activity slowly   Complete by:  As directed       Signed: Lorenso CourierVahini Chundi, MD Internal Medicine PGY1 Pager:[phone] 12/20/2017, 2:19 PM

## 2017-12-15 NOTE — Progress Notes (Signed)
  [facility] Progress Note    Assessment/ Plan:   1.  AKI on CKD: secondary to volume depletion.  Getting isotonic fluids at present but sodium is inching upwards.  Would switch to 1/2 NS.  If she can't eat and drink properly I anticipate this happening over and over at her nursing home  2.  Dementia: in a NH.  ON Exelon patch  3.  HTN: off losartan and Lasix and these should not be restarted.    Subjective:    Sitting in chair, NAD.  Feeling OK. No complaints   Objective:   BP 116/70 (BP Location: Left Arm)   Pulse 99   Temp 98.9 F (37.2 C) (Axillary)   Resp 18   Ht 5' (1.524 m)   Wt 61.5 kg (135 lb 9.3 oz)   SpO2 95%   BMI 26.48 kg/m   Intake/Output Summary (Last 24 hours) at 12/15/2017 1347 Last data filed at 12/15/2017 1300 Gross per 24 hour  Intake 1911.67 ml  Output 402 ml  Net 1509.67 ml   Weight change:   Physical Exam: Gen: NAD, sleeping, easily arousable CVS:RRR Resp:clear QMV:HQIONGEXBMWUAbd:nondistended nontender Ext: trace LE edema  Imaging: No results found.  Labs: BMET Recent Labs  Lab 12/10/17 2139 12/10/17 2353 12/11/17 1458 12/12/17 0713 12/13/17 0749 12/14/17 0634 12/15/17 0716  NA 129* 129* 131* 134* 137 143 145  K 3.9 3.7 3.8 3.5 3.5 3.7 3.9  CL 88* 87* 94* 97* 105 110 111  CO2 24 27 23 22  17* 22 19*  GLUCOSE 113* 105* 157* 83 80 85 86  BUN 97* 101* 97* 99* 85* 76* 78*  CREATININE 8.76* 8.95* 8.71* 8.25* 6.34* 5.79* 4.85*  CALCIUM 11.1* 10.8* 9.9 9.4 8.6* 8.8* 8.8*  PHOS  --  5.8* 5.1*  --  3.6  --   --    CBC Recent Labs  Lab 12/10/17 2139  WBC 5.9  HGB 13.1  HCT 38.1  MCV 85.6  PLT 248    Medications:    . feeding supplement (ENSURE ENLIVE)  237 mL Oral TID BM  . heparin  5,000 Units Subcutaneous Q8H  . rivastigmine  4.6 mg Transdermal Daily  . sodium chloride flush  3 mL Intravenous Q12H      Bufford ButtnerElizabeth Aireal Slater, MD [facility] pgr [phone] 12/15/2017, 1:47 PM

## 2017-12-15 NOTE — NC FL2 (Signed)
[place] MEDICAID FL2 LEVEL OF CARE SCREENING TOOL     IDENTIFICATION  Patient Name: [name] Birthdate: 05/20/1926 Sex: female Admission Date (Current Location): 12/10/2017  [facility] [place]   Facility and Address:  [facility], [address], [place], [place] [address]      Provider Number: [id number]  Attending Physician Name and Address:  Anne Shutteraines, Alexander N, MD  Relative Name and Phone Number:  Kyra Leylandlexander Marinaccio, son [phone]    Current Level of Care: Hospital Recommended Level of Care: Skilled Nursing Facility Prior Approval Number:    Date Approved/Denied:   PASRR Number: [id number]  Discharge Plan: SNF    Current Diagnoses: Patient Active Problem List   Diagnosis Date Noted  . AKI (acute kidney injury) (HCC) 12/10/2017  . Difficulty swallowing 12/09/2017  . Epidermoid cyst of skin 08/06/2017  . Bilateral lower extremity edema 06/07/2016  . Healthcare maintenance 01/13/2015  . Dementia of the Alzheimer's type 04/21/2013  . Iron deficiency anemia 08/29/2010  . CKD (chronic kidney disease) 06/06/2010  . VITAMIN D DEFICIENCY 12/07/2009  . Osteopenia 12/06/2009  . Gout 04/22/2007  . LOSS, SENSORINEURAL HEARING, BILAT 04/22/2007  . Essential hypertension 04/22/2007    Orientation RESPIRATION BLADDER Height & Weight     Self, Place  O2 Incontinent Weight: 135 lb 9.3 oz (61.5 kg) Height:  5' (152.4 cm)  BEHAVIORAL SYMPTOMS/MOOD NEUROLOGICAL BOWEL NUTRITION STATUS      Continent Diet(Dysphagia 1 thin liquids)  AMBULATORY STATUS COMMUNICATION OF NEEDS Skin   Extensive Assist Verbally Normal                       Personal Care Assistance Level of Assistance  Dressing, Bathing, Feeding Bathing Assistance: Maximum assistance Feeding assistance: Limited assistance Dressing Assistance: Maximum assistance     Functional Limitations Info  Hearing, Sight, Speech Sight Info: Adequate Hearing Info:  Impaired Speech Info: Adequate    SPECIAL CARE FACTORS FREQUENCY  PT (By licensed PT), OT (By licensed OT)     PT Frequency: 5x week OT Frequency: 5x week            Contractures      Additional Factors Info  Code Status, Allergies Code Status Info: Full Allergies Info: No Known Allergies           Current Medications (12/15/2017):  This is the current hospital active medication list Current Facility-Administered Medications  Medication Dose Route Frequency Provider Last Rate Last Dose  . 0.9 %  sodium chloride infusion  250 mL Intravenous PRN Valentino NoseBoswell, Nathan, MD      . 0.9 %  sodium chloride infusion   Intravenous Continuous Chundi, Vahini, MD      . acetaminophen (TYLENOL) tablet 650 mg  650 mg Oral Q6H PRN Reymundo PollGuilloud, Carolyn, MD       Or  . acetaminophen (TYLENOL) suppository 650 mg  650 mg Rectal Q6H PRN Reymundo PollGuilloud, Carolyn, MD      . feeding supplement (ENSURE ENLIVE) (ENSURE ENLIVE) liquid 237 mL  237 mL Oral TID BM Molt, Bethany, DO   237 mL at 12/15/17 1002  . heparin injection 5,000 Units  5,000 Units Subcutaneous Q8H Valentino NoseBoswell, Nathan, MD   5,000 Units at 12/15/17 [id number]  . labetalol (NORMODYNE,TRANDATE) injection 5 mg  5 mg Intravenous Q2H PRN Toney RakesLacroce, Samantha J, MD      . ondansetron (ZOFRAN) tablet 4 mg  4 mg Oral Q6H PRN Reymundo PollGuilloud, Carolyn, MD  Or  . ondansetron (ZOFRAN) injection 4 mg  4 mg Intravenous Q6H PRN Reymundo Poll, MD      . rivastigmine (EXELON) 4.6 mg/24hr 4.6 mg  4.6 mg Transdermal Daily Valentino Nose, MD   4.6 mg at 12/15/17 1005  . senna-docusate (Senokot-S) tablet 1 tablet  1 tablet Oral QHS PRN Reymundo Poll, MD      . sodium chloride flush (NS) 0.9 % injection 3 mL  3 mL Intravenous Q12H Valentino Nose, MD   3 mL at 12/14/17 2146  . sodium chloride flush (NS) 0.9 % injection 3 mL  3 mL Intravenous PRN Valentino Nose, MD         Discharge Medications: Please see discharge summary for a list of discharge medications.  Relevant  Imaging Results:  Relevant Lab Results:   Additional Information SS#: [id number]  Tresa Moore, LCSW

## 2017-12-15 NOTE — Clinical Social Work Note (Signed)
Clinical Social Work Assessment  Patient Details  Name: [name] MRN: [id number] Date of Birth: February 04, 1926  Date of referral:  12/15/17               Reason for consult:  Facility Placement                Permission sought to share information with:  Facility Art therapist granted to share information::  Yes, Release of Information Signed  Name::     [name]  Agency::  SNF  Relationship::  son  Contact Information:     Housing/Transportation Living arrangements for the past 2 months:  [place] of Information:  Adult Children, Facility Patient Interpreter Needed:  None Criminal Activity/Legal Involvement Pertinent to Current Situation/Hospitalization:  No - Comment as needed Significant Relationships:  Adult Children, Other Family Members Lives with:  Facility Resident Do you feel safe going back to the place where you live?  Yes Need for family participation in patient care:  Yes (Comment)  Care giving concerns:  Patient is from [facility] Care Unit. Patient only oriented to person and place. CSW then contacted son, [name], who indicated that he desires patient to return back to SNF.  CSW advised son, that CSW will f/u with nursing facility to see if they can manage patient with new impairment.  CSW called [name] and [name] at [facility] to review clinicals-Physical Therapy Notes to advise if pt can return and get her skilled needs met. CSW will f/u.  Social Worker assessment / plan:  CSW will f/u for disposition.  Employment status:  Retired Forensic scientist:  Medicare PT Recommendations:  [place] / Referral to community resources:  [place]  Patient/Family's Response to care:  Family appreciative of CSW assistance with placement as family desires to return to nursing facility. CSW advised son that CSW will f/u to see if facility can manage  patient with new impairment. Nursing facility uses [facility] for rehab in facility.  Patient/Family's Understanding of and Emotional Response to Diagnosis, Current Treatment, and Prognosis:  Family aware of patient's impairment and are in agreement with returning to assisted living facility however facility wants to make sure they can manage patient at facility. CSW sent clinical notes for review and [name] or [name] will advise CSW if they can manage patient given new impairment.  CSW advised son and he is aware of the needs and facility's role. Family hopeful that patient can return to [facility] and if not, CSW will provide list of SNF's that have offered bed. No issues or concerns at this time. CSW f/u for SNF placement.  Emotional Assessment Appearance:  Appears stated age Attitude/Demeanor/Rapport:  Unable to Assess Affect (typically observed):  Unable to Assess Orientation:  Oriented to Self Alcohol / Substance use:    Psych involvement (Current and /or in the community):     Discharge Needs  Concerns to be addressed:  Discharge Planning Concerns Readmission within the last 30 days:  No Current discharge risk:  Physical Impairment, Dependent with Mobility Barriers to Discharge:  No Barriers Identified   Normajean Baxter, LCSW 12/15/2017, 12:53 PM

## 2017-12-15 NOTE — Consult Note (Signed)
[facility] Consult: 3:02 PM 12/15/2017  LOS: 5 days    Referring Provider: Dr Sandre Kitty  Primary Care Physician:  Rozann Lesches, MD Primary Gastroenterologist:  Dr. [name]   Reason for Consultation:  dysphagia   HPI: [name] is a 84 y.o. female.  Resident of SNF.  Hx stage 4 CKD.  Previous obesity.  DM, on no medications for this..  Gout.  Arthritis.  Anemia, took po iron in past.  . Dementia.    Diverticular bleeds 2004, 2005.  S/p right hemi-colectomy for mgt of large, dysplastic polyp.   H Pylori + gastric ulcer 1991, healed at follow up and after completing triple therapy.   2001 EGD.  For anemia.  HH, esophageal stricture dilated, gastritis.   2009 colonoscopy: polyp surveillance.  Left sided tics with associated narrowing.  S/p right hemi-colectomy.  No recurrent polyps.    06/2013 EGD:  Large, non-reducible HH in thoracic cavity.  Mild distal esophageal stricture, savary dilated.  Gastric bx to r/o recurrent H Pylori: mild, chronic, inactive gastritis and no H Pylori.  .  06/2011 CT scan ab/pelvis showed large HH displacing lower thoracic aorta and compressing stomach. 01/2017 CXR also confirms large HH.         Admitted last week with AKI in setting of decreased PO intake, 14# wt loss and dysphagia.  No PPI on home med list.   SLP eval with bedside swallow study 12/11/16.  "Concern for primary esophageal dysphasia, perhaps stricture versus dysmotility".   Impaired cognition affecting her ability to fully participate in the study.  Swallows of water and pures associated with grimacing and patient holding her hands up to her sternum and coughing.  SLP recommendation was for pured foods, thin liquids and GI consultation This morning she regurgitated liquids which she had just swallowed.  A little  bit later she drank some Ensure out of the bottle and did fine with this.  This morning she coughed up some frothy, pink sputum and had been drinking strawberry Ensure earlier.    Past Medical History:  Diagnosis Date  . Cholelithiasis 01/13/2015  . CKD (chronic kidney disease)   . Diverticulosis of colon   . Elevated alkaline phosphatase level    Chronic; GGT was normal (30) on 09/18/2004.  . Endometrial polyp    S/P operative hysteroscopy with removal of polyps, dilatation and curettage on 10/20/2000 by Dr. Antionette Char.  . Esophageal stricture   . Gastric ulcer 1991   H. pylori positive in 1991; EGD 01/31/00 by Dr. Juanda Chance showed mild benign distal esophageal stricture (dilator passed), gastritis with evidence of coffee-ground material, and a 4 cm non-reducible hiatal hernia; EGD  02/09/2008 showed hiatal hernia, and mild benign distal espohageal stricture, was otherwise normal.  . Gout   . Hiatal hernia   . Hypertension   . Iron deficiency anemia   . Lower GI bleed   . Malignant neoplasm of colon 1991   S/P right hemicolectomy in 1991 by Dr. Jamey Ripa, reportedly done for a malignant polyp; colonoscopy by Dr. [name]  02/09/2008 showed  marked left colon diverticulosis with partial narrowing, S/P  remote right hemicolectomy.  . Osteopenia    DXA bone density scan on 10/16/2009 showed osteopenia with an AP spine T-score of -1.5, left femoral neck T-score of -1.7, and right femoral neck T-score of -2.0.  Her FRAX score gave an estimated ten-year probability of major osteoporotic fracture of 7.3%, and a ten-year probability of hip fracture of 2.1%.  . Sensorineural hearing loss of both ears    Audiological evaluation on February 05, 2007 showed a slight to mild loss in both ears through 4000 Hz, with a moderate to severe precipitous drop in the highest frequencies (6000 to 8000 Hz).  . Tinnitus, bilateral    Audiological evaluation on February 05, 2007 showed a slight to mild loss of hearing in  both ears through 4000 Hz, with a moderate to severe precipitous drop in the highest frequencies (6000 to 8000 Hz); her speech recognition abilities were within normal limits and a slightly elevated conversational level.  [name] Unsteady gait 08/07/2011  . Vitamin D deficiency    Vitamin D (25-Hydroxy) <10 ng/mL on 12/07/2009.    Past Surgical History:  Procedure Laterality Date  . HEMICOLECTOMY  1991   Right -  with ileocolic anastamosis by Dr. Jamey Ripa  . HYSTEROSCOPY     S/P operative hysteroscopy with removal of polyps, dilatation and curettage on 10/20/2000 by Dr. Antionette Char.    Prior to Admission medications   Medication Sig Start Date End Date Taking? Authorizing Provider  allopurinol (ZYLOPRIM) 100 MG tablet Take 2 tablets (200 mg total) daily by mouth. 10/07/17  Yes NedrudJeanella Flattery, MD  Calcium Carbonate-Vitamin D 600-400 MG-UNIT tablet Take 1 tablet by mouth 2 (two) times daily. 05/25/17  Yes Ahmed, Elyn Peers, MD  colchicine 0.6 MG tablet Take 0.5 tablets (0.3 mg total) by mouth daily as needed (For use only during gout flare). 10/27/17 10/27/18 Yes Nedrud, Jeanella Flattery, MD  diltiazem (TIAZAC) 420 MG 24 hr capsule TAKE ONE CAPSULE BY MOUTH EVERY DAY Patient taking differently: TAKE 420 mg CAPSULE BY MOUTH EVERY DAY 11/29/17  Yes Nedrud, Jeanella Flattery, MD  furosemide (LASIX) 80 MG tablet Take 1 tablet (80 mg total) by mouth 2 (two) times daily. 06/25/17 06/25/18 Yes Nedrud, Jeanella Flattery, MD  guaifenesin (ROBITUSSIN) 100 MG/5ML syrup Take 200 mg by mouth every 6 (six) hours as needed for cough.   Yes [provider]  loperamide (IMODIUM A-D) 2 MG tablet Take 2 mg by mouth 4 (four) times daily as needed for diarrhea or loose stools.   Yes [provider]  losartan (COZAAR) 25 MG tablet Take 1 tablet (25 mg total) daily by mouth. 10/07/17  Yes Nedrud, Jeanella Flattery, MD  magnesium hydroxide (MILK OF MAGNESIA) 400 MG/5ML suspension Take 30 mLs by mouth daily as needed for mild constipation.   Yes  [provider]  memantine (NAMENDA) 10 MG tablet Take 1 tablet (10 mg total) daily by mouth. 10/07/17  Yes NedrudJeanella Flattery, MD  neomycin-bacitracin-polymyxin (NEOSPORIN) ointment Apply 1 application topically as needed for wound care.   Yes [provider]  potassium chloride (KLOR-CON 10) 10 MEQ tablet Take 1 tablet (10 mEq total) by mouth daily. 07/30/17  Yes Nedrud, Jeanella Flattery, MD  rivastigmine (EXELON) 4.6 mg/24hr PLACE 1 PATCH (4.6 MG TOTAL) ONTO THE SKIN DAILY 12/09/17  Yes Ginger Carne, MD    Scheduled Meds: . feeding supplement (ENSURE ENLIVE)  237 mL Oral TID BM  . heparin  5,000 Units Subcutaneous Q8H  .  rivastigmine  4.6 mg Transdermal Daily  . sodium chloride flush  3 mL Intravenous Q12H   Infusions: . sodium chloride    . sodium chloride 100 mL/hr at 12/15/17 1347   PRN Meds: sodium chloride, acetaminophen **OR** acetaminophen, labetalol, ondansetron **OR** ondansetron (ZOFRAN) IV, senna-docusate, sodium chloride flush   Allergies as of 12/10/2017  . (No Known Allergies)    Family History  Problem Relation Age of Onset  . Breast cancer Maternal Aunt   . Colon cancer Neg Hx     Social History   Socioeconomic History  . Marital status: Widowed    Spouse name: Not on file  . Number of children: Not on file  . Years of education: Not on file  . Highest education level: Not on file  Social Needs  . Financial resource strain: Not on file  . Food insecurity - worry: Not on file  . Food insecurity - inability: Not on file  . Transportation needs - medical: Not on file  . Transportation needs - non-medical: Not on file  Occupational History    Employer: RETIRED    Comment: retired  Tobacco Use  . Smoking status: Never Smoker  . Smokeless tobacco: Never Used  Substance and Sexual Activity  . Alcohol use: No    Alcohol/week: 0.0 oz  . Drug use: No  . Sexual activity: Not Currently  Other Topics Concern  . Not on file  Social History Narrative   . Not on file    REVIEW OF SYSTEMS: Constitutional: 10 pound weight loss within the last 3 months.  Her ambulatory status is not known, she cannot tell me. ENT:  No nose bleeds Pulm: Denies shortness of breath.  Has been observed to be coughing. CV:  No palpitations, no LE edema.  GU:  No hematuria, no frequency GI: She herself is unable to answer questions regarding her swallowing. Heme: There are no reports of unusual bleeding or bruising. Transfusions: Nothing in her records over the past few years of requiring blood transfusions.   Neuro:  No headaches, no peripheral tingling or numbness Derm:  No itching, no rash or sores.  Endocrine:  No sweats or chills.  No polyuria or dysuria Immunization: Extensive list of recent and past immunizations reviewed. Travel:  None beyond local counties in last few months.    PHYSICAL EXAM: Vital signs in last 24 hours: Vitals:   12/15/17 0640 12/15/17 1300  BP: 140/90 116/70  Pulse: (!) 103 99  Resp: 18 18  Temp: 98.9 F (37.2 C) 98.9 F (37.2 C)  SpO2: 95% 95%   Wt Readings from Last 3 Encounters:  12/12/17 61.5 kg (135 lb 9.3 oz)  12/09/17 59.3 kg (130 lb 12.8 oz)  09/16/17 66 kg (145 lb 8 oz)    General: Frail looking, aged, AAF. Head: Symmetric facies.  No swelling.  No signs of head trauma. Eyes: No conjunctival pallor.  No scleral icterus Ears: Does not appear to be HOH Nose: No discharge or congestion. Mouth: Thick, milky covering over the tongue and lips.  Most all of her teeth are gone. Neck: No mass, no JVD, no thyromegaly. Lungs: No dyspnea.  Coughed a slight bit several seconds after swallowing some Ensure.  Some fine rales in the right base. Heart: RRR.  Rate in the low 100s.  No MRG. Abdomen: Soft.  Not tender or distended.  Active bowel sounds.  No HSM, masses, hernias, bruits..   Rectal: Deferred Musc/Skeltl: Deformities in the fingers suggesting rheumatoid  arthritis. Extremities: Lower extremity ankle/pedal  edema without pitting. Neurologic: She is alert.  She follows simple commands.  Speech is garbled and hard to understand.  She can tell me the month and day but not the year of her birth.  She cannot tell me where she is or why she is here.  Grip strength weak bilaterally, unable to sustain steady grip. Skin: Hyperpigmented spots on both soles of her feet. Tattoos: None Nodes: No cervical adenopathy. Psych: Cooperative, calm.  Intake/Output from previous day: 01/13 0701 - 01/14 0700 In: 2271.7 [P.O.:840; I.V.:1431.7] Out: 402 [Urine:400; Stool:2] Intake/Output this shift: Total I/O In: 120 [P.O.:120] Out: -   LAB RESULTS: No results for input(s): WBC, HGB, HCT, PLT in the last 72 hours. BMET Lab Results  Component Value Date   NA 145 12/15/2017   NA 143 12/14/2017   NA 137 12/13/2017   K 3.9 12/15/2017   K 3.7 12/14/2017   K 3.5 12/13/2017   CL 111 12/15/2017   CL 110 12/14/2017   CL 105 12/13/2017   CO2 19 (L) 12/15/2017   CO2 22 12/14/2017   CO2 17 (L) 12/13/2017   GLUCOSE 86 12/15/2017   GLUCOSE 85 12/14/2017   GLUCOSE 80 12/13/2017   BUN 78 (H) 12/15/2017   BUN 76 (H) 12/14/2017   BUN 85 (H) 12/13/2017   CREATININE 4.85 (H) 12/15/2017   CREATININE 5.79 (H) 12/14/2017   CREATININE 6.34 (H) 12/13/2017   CALCIUM 8.8 (L) 12/15/2017   CALCIUM 8.8 (L) 12/14/2017   CALCIUM 8.6 (L) 12/13/2017   LFT Recent Labs    12/13/17 0749  ALBUMIN 3.3*   PT/INR Lab Results  Component Value Date   INR 1.11 07/05/2015   INR 1.00 08/19/2010   Hepatitis Panel No results for input(s): HEPBSAG, HCVAB, HEPAIGM, HEPBIGM in the last 72 hours. C-Diff No components found for: CDIFF Lipase     Component Value Date/Time   LIPASE 56.0 05/21/2013 1540    Drugs of Abuse  No results found for: LABOPIA, COCAINSCRNUR, LABBENZ, AMPHETMU, THCU, LABBARB   RADIOLOGY STUDIES: No results found.   IMPRESSION:   *  Dysphagia.  Suspect that the large HH, esophageal dysmotility, and  dementia associated decreased cognition  are playing the key roles here.  Hx of non-obstructing, distal esophageal strictures dilated in 2001 and 2014.  So stricture may be contributing but suspect it is not the key cause.  Lack of dentition certainly not helping but she is clinically having trouble with liquids as well as solids.   Interestingly she has no hx of aspiration PNA.     *  AKI with baseline CKD stage 4.  Diminished po intake associated volume depletion with possible ATN.  Labs improved.  Not a dialysis candidate per Dr Hyman Hopes.       PLAN:     *  D/w Dr Lavon Paganini, will order UGI series to study [facility].  Needs NPO for this so  Will be NPO after midnight and hopefully get study in AM tomorrow.      Jennye Moccasin  12/15/2017, 3:02 PM Pager: [phone]   Attending physician's note   I have taken a history, examined the patient and reviewed the chart. I agree with the Advanced Practitioner's note, impression and recommendations. 84 year old female with dementia stage IV chronic kidney disease, we were asked to evaluate patient for dysphagia.  Patient was able to provide only her name.  Very pleasant but does not respond appropriately and is not oriented to time  or place.  With worsening renal function acute on chronic kidney injury due to decreased oral intake.  Poor p.o. intake appears to be multifactorial, dementia is also likely playing a role.  Will follow-up upper GI series to evaluate if she has any mass lesion or severe esophageal stricture.  She has a known large hiatal hernia .  Patient is high risk for anesthesia and procedure related complications and I am unclear if it will benefit patient overall.   Iona Beard, MD [phone] Mon-Fri 8a-5p [phone] after 5p, weekends, holidays

## 2017-12-15 NOTE — Social Work (Signed)
CSW awaiting for staff at [facility] to call back to discuss therapy notes that CSW sent for review. ALF is deciding if they are able to assist patient in facility given new impairment. ALF uses [facility] in their facility for rehab.  CSW will call son back, once it is clear the outcome of placement for rehab.  CSW will f/u.  Keene BreathPatricia Karsen Nakanishi, LCSW Clinical Social Worker [phone]

## 2017-12-15 NOTE — Progress Notes (Signed)
  Date: 12/15/2017  Patient name: Trinidad CuretMary M Coaxum  Medical record number: [id number]  Date of birth: 09/26/1926   I have seen and evaluated this patient and I have discussed the plan of care with the house staff. Please see their note for complete details. I concur with their findings with the following additions/corrections:   Rapid response called overnight for desaturation at 88%. Briefly required nasal cannula, but was not cooperative with wearing it and appears to have normal sats this morning on room air. No chest x-ray obtained, but possibly was due to fluid overload given her ongoing hydration for her prerenal azotemia in the context of her diastolic heart failure. As she is not hypoxic this morning, we will hold off on chest x-ray at this point, but will check if she develops additional episodes of hypoxia.  Creatinine continues to decline, and the real question is where will she settle out. Her prior baseline creatinine was between 1.3 and 2. For now we will continue IV hydration. We will try to be cautious and avoid further hypoxia, but I also doubt that she is capable of taking in enough fluid orally to continue her renal recovery. Of course, down the road her poor oral intake may put her back in the same situation again, but for now we will try to maximize her renal function with IV fluids until she has reached her new baseline, and then plan to discharge her back to SNF and have outpatient follow-up of her renal function and diminished oral intake.  Anne Shutteraines, Alexander N, MD 12/15/2017, 10:58 AM

## 2017-12-15 NOTE — Progress Notes (Signed)
RN notified IMTS of patients status.

## 2017-12-15 NOTE — Progress Notes (Signed)
SLP Cancellation Note  Patient Details Name: [name] MRN: [id number] DOB: 12/10/1925   Cancelled treatment:       Reason Eval/Treat Not Completed: SLP screened, no needs identified, will sign off. Discussed prior finding of adequate oral and pharyngeal function with MD, but probable baseline esophageal dysphagia in the setting of severe cognitive impairment. No further SLP interventions warranted to target esophageal function as diet is already significantly restricted to puree and liquids with very limited intake and signs of grimacing and globus with swallowing. Discussed with MD.   Claudine MoutoneBlois, Kacy Hegna Caroline 12/15/2017, 11:26 AM

## 2017-12-15 NOTE — Progress Notes (Signed)
Physical Therapy Treatment Patient Details Name: Trinidad CuretMary M Tomkinson MRN: [id number] DOB: 01/01/1926 Today's Date: 12/15/2017    History of Present Illness [name] is an 84yo female with PMH significant for Alzheimer's dementia, esophageal stricture in 2014 s/p dilation, CKD, HTN, and colon cancer s/p right hemicolectomy in 1991 who presents as a direct admit for acute elevation in creatinine    PT Comments    Patient very pleasant and oriented to person only. Pt agreeable to OOB mobility however limited c/o pain and needed to sit down. Pt reported hurting "all over". Pt overall required min guard/min A for mobility. Continue to progress as tolerated with anticipated d/c to SNF for further skilled PT services.    BP 124/84 (93) Pulse 104  SpO2 90% or > on RA   Follow Up Recommendations  SNF;Supervision/Assistance - 24 hour     Equipment Recommendations  None recommended by PT    Recommendations for Other Services       Precautions / Restrictions Precautions Precautions: Fall    Mobility  Bed Mobility Overal bed mobility: Needs Assistance Bed Mobility: Supine to Sit     Supine to sit: Supervision     General bed mobility comments: min guard for safety  Transfers Overall transfer level: Needs assistance Equipment used: Rolling walker (2 wheeled) Transfers: Sit to/from [employer];Stand Pivot Transfers Sit to Stand: Min guard Stand pivot transfers: Min guard       General transfer comment: pt reported need to urinate upon standing and able to stand pivot [facility] with min guard for safety and no AD; cues for positioning to [facility]   Ambulation/Gait Ambulation/Gait assistance: Min guard;Min assist Ambulation Distance (Feet): 3 Feet Assistive device: Rolling walker (2 wheeled) Gait Pattern/deviations: Step-through pattern;Shuffle;Trunk flexed     General Gait Details: cues for safe use of AD and positioning at edge of recliner for safety when sitting; min guard  for safety and min A for manage RW when turning   Stairs            Wheelchair Mobility    Modified Rankin (Stroke Patients Only)       Balance Overall balance assessment: Needs assistance Sitting-balance support: Feet unsupported;No upper extremity supported Sitting balance-Leahy Scale: Fair     Standing balance support: Single extremity supported;During functional activity Standing balance-Leahy Scale: Fair                              Cognition Arousal/Alertness: Awake/alert Behavior During Therapy: WFL for tasks assessed/performed Overall Cognitive Status: History of cognitive impairments - at baseline                                 General Comments: oriented to person only      Exercises      General Comments General comments (skin integrity, edema, etc.): BP 124/84 (93), pulse 104, and SpO2 90% or > on RA      Pertinent Vitals/Pain Pain Assessment: Faces Faces Pain Scale: Hurts even more Pain Location: "all over" Pain Descriptors / Indicators: Grimacing("hurts") Pain Intervention(s): Limited activity within patient's tolerance;Repositioned    Home Living                      Prior Function            PT Goals (current goals can now be found  in the care plan section) Acute Rehab PT Goals PT Goal Formulation: With patient/family Time For Goal Achievement: 12/18/17 Potential to Achieve Goals: Fair Progress towards PT goals: Progressing toward goals    Frequency    Min 2X/week      PT Plan Current plan remains appropriate    Co-evaluation              AM-PAC PT "6 Clicks" Daily Activity  Outcome Measure  Difficulty turning over in bed (including adjusting bedclothes, sheets and blankets)?: A Little Difficulty moving from lying on back to sitting on the side of the bed? : A Little Difficulty sitting down on and standing up from a chair with arms (e.g., wheelchair, bedside commode, etc,.)?:  Unable Help needed moving to and from a bed to chair (including a wheelchair)?: A Little Help needed walking in hospital room?: A Little Help needed climbing 3-5 steps with a railing? : A Lot 6 Click Score: 15    End of Session Equipment Utilized During Treatment: Gait belt Activity Tolerance: Patient limited by pain Patient left: with call bell/phone within reach;in chair;with chair alarm set Nurse Communication: Mobility status PT Visit Diagnosis: Unsteadiness on feet (R26.81);Other abnormalities of gait and mobility (R26.89);Muscle weakness (generalized) (M62.81);Other symptoms and signs involving the nervous system (R29.898);Difficulty in walking, not elsewhere classified (R26.2)     Time: 1610-9604 PT Time Calculation (min) (ACUTE ONLY): 25 min  Charges:  $Gait Training: 8-22 mins $Therapeutic Activity: 8-22 mins                    G Codes:       Erline Levine, PTA Pager: [phone]     Carolynne Edouard 12/15/2017, 10:03 AM

## 2017-12-16 ENCOUNTER — Inpatient Hospital Stay (HOSPITAL_COMMUNITY): Payer: Medicare Other

## 2017-12-16 LAB — BASIC METABOLIC PANEL
Anion gap: 13 (ref 5–15)
BUN: 84 mg/dL — ABNORMAL HIGH (ref 6–20)
CO2: 19 mmol/L — ABNORMAL LOW (ref 22–32)
Calcium: 8.3 mg/dL — ABNORMAL LOW (ref 8.9–10.3)
Chloride: 114 mmol/L — ABNORMAL HIGH (ref 101–111)
Creatinine, Ser: 4.86 mg/dL — ABNORMAL HIGH (ref 0.44–1.00)
GFR calc Af Amer: 8 mL/min — ABNORMAL LOW (ref >60)
GFR calc non Af Amer: 7 mL/min — ABNORMAL LOW (ref >60)
Glucose, Bld: 75 mg/dL (ref 65–99)
Potassium: 4.3 mmol/L (ref 3.5–5.1)
Sodium: 146 mmol/L — ABNORMAL HIGH (ref 135–145)

## 2017-12-16 MED ORDER — SODIUM CHLORIDE 0.45 % IV SOLN
INTRAVENOUS | Status: AC
  Administered 2017-12-16: 15:00:00 via INTRAVENOUS

## 2017-12-16 NOTE — Care Management Important Message (Signed)
Important Message  Patient Details  Name: Meghan CuretMary M Gavidia MRN: [id number] Date of Birth: 04/04/1926   Medicare Important Message Given:  Yes  On 12/15/2017 sue to the patient illness was not able to sign.  Unsign copy left at bedside. Nocole Zammit 12/16/2017, 8:52 AM

## 2017-12-16 NOTE — Progress Notes (Addendum)
   Subjective: [name] was seen moaning in her bed this morning. She was not able to express what was bothering her. Thought to be due to purewick as she was seen tugging on it.   Objective:  Vital signs in last 24 hours: Vitals:   12/15/17 1300 12/15/17 2039 12/16/17 0500 12/16/17 0531  BP: 116/70 110/78  109/62  Pulse: 99 100  97  Resp: 18 18  17   Temp: 98.9 F (37.2 C) 98.7 F (37.1 C)  99 F (37.2 C)  TempSrc: Axillary Axillary  Axillary  SpO2: 95% 97%  98%  Weight:   137 lb 12.6 oz (62.5 kg)   Height:       Physical Exam  Constitutional: She appears well-developed and well-nourished. No distress.  HENT:  Head: Normocephalic and atraumatic.  Eyes: Conjunctivae are normal.  Cardiovascular: Normal rate, regular rhythm and normal heart sounds.  Respiratory: Effort normal and breath sounds normal. No respiratory distress. She has no wheezes.  GI: Soft. Bowel sounds are normal. She exhibits no distension. There is no tenderness.  Musculoskeletal: She exhibits no edema.  Neurological:  Dementia at baseline  Skin: She is not diaphoretic.   Assessment/Plan:  [name] is an 84 year old female with a history of esophageal stricture status post dilation in 2014 and CKD stage IV whowas found to have AKI while undergoing outpatient work-up for dysphagia.  Acute on chronic kidney disease, likely dehydration AKI is thought to be due to pre-renal volume depletion. The patient's creatinine today is 4.86 from prior 4.85 yesterday.   -Nephrology has signed off today -Continue 1/2NS for 10 hours  Dysphagia The patient is able to tolerate fluids well and is having difficulty with solids. Was seen by GI who will do UGI series today to evaluate the hiatal hernia.  -will follow up on upper GI series   Essential Hypertension The patient's blood pressure this morning was 109/62  -Continuing to hold lasix, losartan, diltiazem  Alzheimer's Dementia - Continue  rivastigmine - Continue delirium precautions  Dispo: Anticipated discharge in approximately 1-2 day(s).   Lorenso CourierVahini Meilani Edmundson, MD Internal Medicine PGY1 Pager:[phone] 12/16/2017, 8:36 AM

## 2017-12-16 NOTE — Progress Notes (Signed)
  [facility] Progress Note    Assessment/ Plan:   1.  AKI on CKD: secondary to volume depletion.  Getting isotonic fluids at present but sodium is inching upwards.  Would switch to 1/2 NS for continued hydration.  If she can't eat and drink properly I anticipate this happening over and over at her nursing home.  Root cause seems to be #4 as below.    2.  Dementia: in a NH.  ON Exelon patch  3.  HTN: off losartan and Lasix and these should not be restarted.  4.  Dysphagia: GI c/s.  We will sign off.  Please don't hesitate to call with questions or concerns.      Subjective:    No complaints.  Has dysphagia which is limiting intake.     Objective:   BP 109/62 (BP Location: Right Arm)   Pulse 97   Temp 99 F (37.2 C) (Axillary)   Resp 17   Ht 5' (1.524 m)   Wt 62.5 kg (137 lb 12.6 oz)   SpO2 98%   BMI 26.91 kg/m   Intake/Output Summary (Last 24 hours) at 12/16/2017 1039 Last data filed at 12/15/2017 1300 Gross per 24 hour  Intake 120 ml  Output -  Net 120 ml   Weight change:   Physical Exam: Gen: NAD, sleeping, easily arousable CVS:RRR Resp:clear GNF:AOZHYQMVHQIOAbd:nondistended nontender Ext: trace LE edema  Imaging: No results found.  Labs: BMET Recent Labs  Lab 12/10/17 2139 12/10/17 2353 12/11/17 1458 12/12/17 0713 12/13/17 0749 12/14/17 0634 12/15/17 0716  NA 129* 129* 131* 134* 137 143 145  K 3.9 3.7 3.8 3.5 3.5 3.7 3.9  CL 88* 87* 94* 97* 105 110 111  CO2 24 27 23 22  17* 22 19*  GLUCOSE 113* 105* 157* 83 80 85 86  BUN 97* 101* 97* 99* 85* 76* 78*  CREATININE 8.76* 8.95* 8.71* 8.25* 6.34* 5.79* 4.85*  CALCIUM 11.1* 10.8* 9.9 9.4 8.6* 8.8* 8.8*  PHOS  --  5.8* 5.1*  --  3.6  --   --    CBC Recent Labs  Lab 12/10/17 2139  WBC 5.9  HGB 13.1  HCT 38.1  MCV 85.6  PLT 248    Medications:    . feeding supplement (ENSURE ENLIVE)  237 mL Oral TID BM  . heparin  5,000 Units Subcutaneous Q8H  . rivastigmine  4.6 mg Transdermal Daily  .  sodium chloride flush  3 mL Intravenous Q12H      Bufford ButtnerElizabeth Ria Redcay, MD [facility] pgr [phone] 12/16/2017, 10:39 AM

## 2017-12-16 NOTE — Progress Notes (Signed)
  Date: 12/16/2017  Patient name: [name]  Medical record number: [id number]  Date of birth: 06/01/1926   I have seen and evaluated this patient and I have discussed the plan of care with the house staff. Please see their note for complete details. I concur with their findings with the following additions/corrections:   After days of improvement with gentle hydration, her creatinine seems to have plateaued at 4.9 today. We have consulted GI for evaluation of her ongoing dysphagia, and I appreciate them seeing her. She had a upper GI barium swallow today, which showed dysmotility and tortuosity of the distal esophagus, but did not appear to show any reversible strictures or mass effect. Have discussed her case with the GI attending and she likely would not benefit from EGD, which would be a high-risk procedure for her. We will discuss the findings with her family and address goals of care with them. I suspect that her poor oral intake is multifactorial, resulting from esophageal dysmotility as well as advanced dementia. She seems to require constant prompting in order to drink liquids. I suspect she will continue to have poor oral intake and will likely develop worsening renal failure in the future. It would be appropriate to consider hospice involvement for her and to try to avoid further hospitalizations for her.  Anne Shutteraines, Alexander N, MD 12/16/2017, 3:40 PM

## 2017-12-16 NOTE — Progress Notes (Signed)
Daily Rounding Note  12/16/2017, 2:23 PM  LOS: 6 days   SUBJECTIVE:   No complaints.     OBJECTIVE:         Vital signs in last 24 hours:    Temp:  [98.7 F (37.1 C)-99 F (37.2 C)] 99 F (37.2 C) (01/15 0531) Pulse Rate:  [97-100] 97 (01/15 0531) Resp:  [17-18] 17 (01/15 0531) BP: (109-110)/(62-78) 109/62 (01/15 0531) SpO2:  [97 %-98 %] 98 % (01/15 0531) Weight:  [62.5 kg (137 lb 12.6 oz)] 62.5 kg (137 lb 12.6 oz) (01/15 0500) Last BM Date: 12/13/17 Filed Weights   12/12/17 0519 12/16/17 0500  Weight: 61.5 kg (135 lb 9.3 oz) 62.5 kg (137 lb 12.6 oz)   Did not reexamine pt.    Intake/Output from previous day: 01/14 0701 - 01/15 0700 In: 120 [P.O.:120] Out: -   Intake/Output this shift: No intake/output data recorded.  Lab Results: No results for input(s): WBC, HGB, HCT, PLT in the last 72 hours. BMET Recent Labs    12/14/17 0634 12/15/17 0716 12/16/17 0952  NA 143 145 146*  K 3.7 3.9 4.3  CL 110 111 114*  CO2 22 19* 19*  GLUCOSE 85 86 75  BUN 76* 78* 84*  CREATININE 5.79* 4.85* 4.86*  CALCIUM 8.8* 8.8* 8.3*   LFT No results for input(s): PROT, ALBUMIN, AST, ALT, ALKPHOS, BILITOT, BILIDIR, IBILI in the last 72 hours. PT/INR No results for input(s): LABPROT, INR in the last 72 hours. Hepatitis Panel No results for input(s): HEPBSAG, HCVAB, HEPAIGM, HEPBIGM in the last 72 hours.  Studies/Results: Dg Esophagus  Result Date: 12/16/2017 CLINICAL DATA:  Decreased p.o. intake with dysphagia and chest pain while eating. History of Alzheimer's disease. Prior esophageal stricture and dilatation in 2014. EXAM: ESOPHOGRAM/BARIUM SWALLOW TECHNIQUE: Single contrast examination was performed using  thin barium. FLUOROSCOPY TIME:  Fluoroscopy Time:  1 minute, 42 seconds. Radiation Exposure Index (if provided by the fluoroscopic device): 1.3 mGy Number of Acquired Spot Images: 0 COMPARISON:  CT abdomen pelvis  dated June 16, 2011. FINDINGS: Preliminary KUB demonstrates a nonobstructive bowel gas pattern. Unchanged small left pleural effusion and left basilar atelectasis versus infiltrate. Limited study due to patient's refusal to swallow a significant amount of oral contrast. Only the esophagus was therefore evaluated. There are moderate tertiary contractions of the distal esophagus, which has a slightly corkscrew appearance. No obstruction to the forward flow of contrast throughout the esophagus and into the stomach. Large hiatal hernia. IMPRESSION: 1. Limited evaluation of the esophagus only due to patient's refusal to swallow a significant amount of oral contrast. 2. Prominent tertiary contractions within the distal esophagus, which has a slightly corkscrew appearance, as can be seen with diffuse esophageal spasm or nonspecific esophageal motility disorder. 3. Large hiatal hernia. Electronically Signed   By: Obie DredgeWilliam T Derry M.D.   On: 12/16/2017 13:47    ASSESMENT:   *  Dysphagia.  No sticture seen but significant dysmotility, spasm and large HH on esophagram.  EGD, dilatation not indicated and of no potential benefit.    *  AKI on top of stage 4 (? Stage 5 now) CKD.  Improved.     PLAN   *  Restarted D1 diet puree with thin liquids per SLP rec.    *  Would be of benefit to have a goals of care consult with pt's family.  As Dr Signe ColtUpton points out in her note today, " If she  can't eat and drink properly I anticipate this (AKI) happening over and over at her nursing home".      [name]  12/16/2017, 2:23 PM Pager: [phone]   Attending physician's note   I have taken an interval history, reviewed the chart and examined the patient. I agree with the Advanced Practitioner's note, impression and recommendations.  Discussed findings of esophagogram with Dr Sandre Kitty.  Hold off EGD as less likely to benefit and patient is a high risk for possible complications Will sign off  K Scherry Ran,  MD [phone] Mon-Fri 8a-5p [phone] after 5p, weekends, holidays

## 2017-12-17 ENCOUNTER — Encounter (HOSPITAL_COMMUNITY): Payer: Self-pay | Admitting: *Deleted

## 2017-12-17 DIAGNOSIS — G309 Alzheimer's disease, unspecified: Secondary | ICD-10-CM

## 2017-12-17 DIAGNOSIS — E870 Hyperosmolality and hypernatremia: Secondary | ICD-10-CM

## 2017-12-17 DIAGNOSIS — Z79899 Other long term (current) drug therapy: Secondary | ICD-10-CM

## 2017-12-17 DIAGNOSIS — E860 Dehydration: Secondary | ICD-10-CM

## 2017-12-17 LAB — BASIC METABOLIC PANEL
Anion gap: 15 (ref 5–15)
BUN: 90 mg/dL — ABNORMAL HIGH (ref 6–20)
CO2: 19 mmol/L — ABNORMAL LOW (ref 22–32)
Calcium: 8.6 mg/dL — ABNORMAL LOW (ref 8.9–10.3)
Chloride: 116 mmol/L — ABNORMAL HIGH (ref 101–111)
Creatinine, Ser: 4.8 mg/dL — ABNORMAL HIGH (ref 0.44–1.00)
GFR calc Af Amer: 8 mL/min — ABNORMAL LOW (ref >60)
GFR calc non Af Amer: 7 mL/min — ABNORMAL LOW (ref >60)
Glucose, Bld: 82 mg/dL (ref 65–99)
Potassium: 4 mmol/L (ref 3.5–5.1)
Sodium: 150 mmol/L — ABNORMAL HIGH (ref 135–145)

## 2017-12-17 MED ORDER — DEXTROSE 5 % IV SOLN
INTRAVENOUS | Status: DC
  Administered 2017-12-17 – 2017-12-18 (×2): via INTRAVENOUS

## 2017-12-17 MED ORDER — SODIUM CHLORIDE 0.45 % IV SOLN: INTRAVENOUS | Status: DC

## 2017-12-17 NOTE — Progress Notes (Signed)
Nutrition Follow-up  DOCUMENTATION CODES:   Not applicable  INTERVENTION:   Continue Ensure Enlive po BID, each supplement provides 350 kcal and 20 grams of protein  Would recommend Cortrak feeding tube placement for nutrition support if within goals of care.  NUTRITION DIAGNOSIS:   Inadequate oral intake related to (Alzheimer's dementia) as evidenced by meal completion < 25%. Ongoing  GOAL:   Patient will meet greater than or equal to 90% of their needs Unmet.  MONITOR:   PO intake, Supplement acceptance, Labs, Skin, Weight trends, I & O's  ASSESSMENT:   84 yo Female with PMH significant for Alzheimer's dementia, esophageal stricture in 2014 s/p dilation, CKD, HTN, and colon cancer s/p right hemicolectomy in 1991 who presented as a direct admit for acute elevation in creatinine.  Pt confused and trying to get out of her bed upon intern visit. Observed uneaten breakfast tray and unopened Ensure Enlive on tray table. Pt appeared to be well nourished.   Noted a consult for palliative care on 1/16. Goals of care pending.  Labs and medications reviewed. Na 150 (H). CBGs 82, 84, 78  SLP (1/14) no changes in diet recommendations due to baseline esophageal dysphagia in the setting of severe dementia.  Intake/Output Summary (Last 24 hours) at 12/17/2017 1023 Last data filed at 12/17/2017 0600 Gross per 24 hour  Intake 20 ml  Output 0 ml  Net 20 ml    NUTRITION - FOCUSED PHYSICAL EXAM:  Unable to complete NFPE. Pt was disoriented and trying to get out of bed.  Diet Order:  DIET - DYS 1 Room service appropriate? Yes; Fluid consistency: Thin  EDUCATION NEEDS:   No education needs have been identified at this time  Skin:  Skin Assessment: Skin Integrity Issues: Skin Integrity Issues:: Other (Comment) Other: scars, ecchymosis  Last BM:  1/12  Height:   Ht Readings from Last 1 Encounters:  12/11/17 5' (1.524 m)    Weight:   Wt Readings from Last 1 Encounters:   12/16/17 137 lb 12.6 oz (62.5 kg)    Ideal Body Weight:  45.4 kg  BMI:  Body mass index is 26.91 kg/m.  Estimated Nutritional Needs:   Kcal:  1250-1450  Protein:  60-75 gm  Fluid:  >/= 1.5 L  Levada Bowersox, MS, Dietetic Intern

## 2017-12-17 NOTE — Progress Notes (Signed)
Internal Medicine Attending:   I saw and examined the patient. I reviewed the resident's note and I agree with the resident's findings and plan as documented in the resident's note.  84 year old woman with severe dementia admitted with acute on chronic renal failure due to dehydration, which stems from poor oral intake. She has distal esophageal dysmotility, which is not amenable to any interventions and combined with her dementia, is likely to cause persistent inadequate oral intake. This makes her high risk for a repeat hypovolemic event and worsening renal function after discharge. I would recommend against PEG tube or long term IV fluids given her advanced dementia. We will consult with palliative care as she is a good candidate for outpatient hospice at this point.

## 2017-12-17 NOTE — Progress Notes (Signed)
   Subjective: [name] was seen sitting up in her bed this morning. She stated that she was doing well. Continues to be pleasantly altered and her thought process is not congruent.  Objective:  Vital signs in last 24 hours: Vitals:   12/16/17 0531 12/16/17 1608 12/16/17 1613 12/16/17 2014  BP: 109/62 93/74 107/62 101/73  Pulse: 97 95  97  Resp: 17 18  18   Temp: 99 F (37.2 C) 97.9 F (36.6 C)  97.6 F (36.4 C)  TempSrc: Axillary Axillary  Axillary  SpO2: 98% 96%  98%  Weight:      Height:       Physical Exam  Constitutional: She appears well-developed and well-nourished. No distress.  HENT:  Head: Normocephalic and atraumatic.  Eyes: Conjunctivae are normal.  Cardiovascular: Normal rate, regular rhythm, normal heart sounds and intact distal pulses.  Respiratory: Effort normal and breath sounds normal. No respiratory distress. She has no wheezes.  GI: Soft. Bowel sounds are normal. She exhibits no distension. There is no tenderness.  Musculoskeletal: She exhibits no edema.  Bilateral lower extremities are enlarged but without any pitting edema  Neurological: She is alert.  Skin: She is not diaphoretic.  Psychiatric: She has a normal mood and affect. Her behavior is normal. Judgment and thought content normal.    Assessment/Plan:  [name] is an 84 year old female with a history of esophageal stricture status post dilation in 2014 and CKD stage IV whowas found to have AKI while undergoing outpatient work-up for dysphagia.  Acute on chronic kidney disease, likely dehydration Hypernatremia AKI is thought to be due to pre-renal volume depletion. The patient's creatinine today is 4.80 from prior 4.86 yesterday (12/16/17). The patient's creatinine has been plateauing for the past 2 days and likely to be her new baseline. The patient's sodium this morning was 150 which is likely due to a free water deficit.   -Continue daily bmp -Replete dextrose 5% at  2875ml/hr  Dysphagia UGI series were done 1/15 which did not show any presence of strictures. There was dysmotility and spasm noted in the distal esophagus with a large hiatal hernia. The patient difficulty with solid food oral intake will likely cause her to have continued kidney injury.   -Consulted palliative care to discuss goals of care (address feeding)  Essential Hypertension The patient's blood pressure this morning was109/62  -Continuing to hold lasix, losartan, diltiazem  Alzheimer's Dementia - Continue rivastigmine - Continue delirium precautions  Dispo: Anticipated discharge in approximately 2-3 day(s).   Lorenso CourierVahini Baylor Cortez, MD Internal Medicine PGY1 Pager:[phone] 12/17/2017, 6:22 AM

## 2017-12-17 NOTE — Progress Notes (Signed)
Pt pulled out her IV for the second time. MD is notified. Soft restraints/mitts per MD's order. Mitts will be applied after IV team consult/placement of IV. RN will continue to monitor.  - Fredirick Latheushana Issaiah Seabrooks

## 2017-12-18 DIAGNOSIS — Z66 Do not resuscitate: Secondary | ICD-10-CM

## 2017-12-18 DIAGNOSIS — Z515 Encounter for palliative care: Secondary | ICD-10-CM

## 2017-12-18 DIAGNOSIS — K224 Dyskinesia of esophagus: Secondary | ICD-10-CM

## 2017-12-18 DIAGNOSIS — R1310 Dysphagia, unspecified: Secondary | ICD-10-CM

## 2017-12-18 DIAGNOSIS — G301 Alzheimer's disease with late onset: Secondary | ICD-10-CM

## 2017-12-18 LAB — BASIC METABOLIC PANEL
Anion gap: 12 (ref 5–15)
BUN: 89 mg/dL — ABNORMAL HIGH (ref 6–20)
CO2: 18 mmol/L — ABNORMAL LOW (ref 22–32)
Calcium: 8.1 mg/dL — ABNORMAL LOW (ref 8.9–10.3)
Chloride: 114 mmol/L — ABNORMAL HIGH (ref 101–111)
Creatinine, Ser: 4.18 mg/dL — ABNORMAL HIGH (ref 0.44–1.00)
GFR calc Af Amer: 10 mL/min — ABNORMAL LOW (ref >60)
GFR calc non Af Amer: 9 mL/min — ABNORMAL LOW (ref >60)
Glucose, Bld: 115 mg/dL — ABNORMAL HIGH (ref 65–99)
Potassium: 4.1 mmol/L (ref 3.5–5.1)
Sodium: 144 mmol/L (ref 135–145)

## 2017-12-18 LAB — RENAL FUNCTION PANEL
Albumin: 3.1 g/dL — ABNORMAL LOW (ref 3.5–5.0)
Anion gap: 11 (ref 5–15)
BUN: 90 mg/dL — ABNORMAL HIGH (ref 6–20)
CO2: 19 mmol/L — ABNORMAL LOW (ref 22–32)
Calcium: 8.1 mg/dL — ABNORMAL LOW (ref 8.9–10.3)
Chloride: 113 mmol/L — ABNORMAL HIGH (ref 101–111)
Creatinine, Ser: 4.23 mg/dL — ABNORMAL HIGH (ref 0.44–1.00)
GFR calc Af Amer: 10 mL/min — ABNORMAL LOW (ref >60)
GFR calc non Af Amer: 8 mL/min — ABNORMAL LOW (ref >60)
Glucose, Bld: 117 mg/dL — ABNORMAL HIGH (ref 65–99)
Phosphorus: 2.9 mg/dL (ref 2.5–4.6)
Potassium: 4.2 mmol/L (ref 3.5–5.1)
Sodium: 143 mmol/L (ref 135–145)

## 2017-12-18 NOTE — Progress Notes (Addendum)
   Subjective: [name] was seen resting in her bed this morning. She continues to be pleasantly demented.  Objective:  Vital signs in last 24 hours: Vitals:   12/17/17 0710 12/17/17 1424 12/17/17 2029 12/18/17 0418  BP: 109/72 (!) 100/37 118/83 119/74  Pulse:  69 81 88  Resp: 16  18 17   Temp: 97.8 F (36.6 C) 97.9 F (36.6 C) 97.6 F (36.4 C) 98.1 F (36.7 C)  TempSrc: Axillary Axillary Axillary Axillary  SpO2: 94% 90% 96% 99%  Weight:      Height:       Physical Exam  Constitutional:  Patient is pleasantly demented  HENT:  Head: Normocephalic and atraumatic.  Eyes: Conjunctivae are normal.  Cardiovascular: Normal rate, regular rhythm and normal heart sounds.  Respiratory: Effort normal and breath sounds normal. No respiratory distress. She has no wheezes.  GI: Soft. Bowel sounds are normal. She exhibits no distension. There is no tenderness.  Musculoskeletal: She exhibits no edema.  Bilateral calfs appear swollen but without any pitting edema  Skin: No rash noted. No erythema.    Assessment/Plan:  84 year old female with a history of esophageal stricture status post dilation in 2014 and CKD stage IV whowas found to have AKI while undergoing outpatient work-up for dysphagia.  Acute on chronic kidney disease, likely dehydration Hypernatremia AKI is thought to be due to pre-renal volume depletion. The patient's creatinine today is4.23 from 4.80yesterday (12/17/17). Will continue to replete the patient's fluids as the patient is likely to affect kidney function.   -Replete dextrose 5% at 2275ml/hr  Esophageal dysmotility The patient has distal esophageal dysmotility noted on upper GI series. Palliative care consulted palliative care to discuss goals of care as patient is unlikely to benefit from PEG tube.  -Palliative care spoke with son who states that he would like patient to be DNR and not have a feeding tube, but son wants to discuss with other siblings  regarding disposition.   Essential Hypertension The patient's blood pressure this morning was119/74  -Continuing to hold lasix, losartan, diltiazem  Alzheimer's Dementia - Continue rivastigmine - Continue delirium precautions  Dispo: Anticipated discharge in approximately 2-3 day(s).   Meghan CourierVahini Agamjot Kilgallon, MD Internal Medicine PGY1 [phone] 12/18/2017, 6:51 AM

## 2017-12-18 NOTE — Progress Notes (Signed)
Physical Therapy Treatment Patient Details Name: [name] MRN: [id number] DOB: October 12, 1926 Today's Date: 12/18/2017    History of Present Illness [name] is an 84yo female with PMH significant for Alzheimer's dementia, esophageal stricture in 2014 s/p dilation, CKD, HTN, and colon cancer s/p right hemicolectomy in 1991 who presents as a direct admit for acute elevation in creatinine    PT Comments    Patient required supervision for safety with bed mobility and +2 HHA for ambulation of 24ft. Pt with c/o pain in stomach and wanted to return to sitting. Pt continues to be a high risk for falls given cognitive deficits and generalized weakness. Current plan remains appropriate.    Follow Up Recommendations  SNF;Supervision/Assistance - 24 hour     Equipment Recommendations  None recommended by PT    Recommendations for Other Services       Precautions / Restrictions Precautions Precautions: Fall    Mobility  Bed Mobility Overal bed mobility: Needs Assistance Bed Mobility: Supine to Sit     Supine to sit: Supervision     General bed mobility comments: supervision for safety  Transfers Overall transfer level: Needs assistance Equipment used: Rolling walker (2 wheeled);2 person hand held assist Transfers: Sit to/from [employer] Transfers Sit to Stand: Mod assist         General transfer comment: assist to power up into standing and to gain balance upon standing; unsuccessful with RW; required +2 HHA; pt began sitting prematurely   Ambulation/Gait Ambulation/Gait assistance: Min assist;+2 physical assistance Ambulation Distance (Feet): 6 Feet Assistive device: 2 person hand held assist Gait Pattern/deviations: Step-through pattern;Trunk flexed;Decreased stride length     General Gait Details: cues for posture; assist for balance   Stairs            Wheelchair Mobility    Modified Rankin (Stroke Patients Only)       Balance Overall  balance assessment: Needs assistance Sitting-balance support: Feet unsupported;No upper extremity supported Sitting balance-Leahy Scale: Fair     Standing balance support: During functional activity;Bilateral upper extremity supported Standing balance-Leahy Scale: Poor                              Cognition Arousal/Alertness: Awake/alert Behavior During Therapy: WFL for tasks assessed/performed Overall Cognitive Status: History of cognitive impairments - at baseline                                 General Comments: oriented to person only      Exercises      General Comments General comments (skin integrity, edema, etc.): pt incontinent of urine and stool upon arrival      Pertinent Vitals/Pain Pain Assessment: Faces Faces Pain Scale: Hurts even more Pain Location: R knee and stomach Pain Descriptors / Indicators: Grimacing;Guarding;Moaning Pain Intervention(s): Limited activity within patient's tolerance;Monitored during session;Repositioned    Home Living                      Prior Function            PT Goals (current goals can now be found in the care plan section) Acute Rehab PT Goals PT Goal Formulation: With patient/family Time For Goal Achievement: 12/18/17 Potential to Achieve Goals: Fair Progress towards PT goals: Progressing toward goals    Frequency    Min 2X/week  PT Plan Current plan remains appropriate    Co-evaluation              AM-PAC PT "6 Clicks" Daily Activity  Outcome Measure  Difficulty turning over in bed (including adjusting bedclothes, sheets and blankets)?: A Little Difficulty moving from lying on back to sitting on the side of the bed? : A Little Difficulty sitting down on and standing up from a chair with arms (e.g., wheelchair, bedside commode, etc,.)?: Unable Help needed moving to and from a bed to chair (including a wheelchair)?: A Little Help needed walking in hospital room?:  A Lot Help needed climbing 3-5 steps with a railing? : Total 6 Click Score: 13    End of Session Equipment Utilized During Treatment: Gait belt Activity Tolerance: Patient limited by pain Patient left: with call bell/phone within reach;in chair;with chair alarm set Nurse Communication: Mobility status PT Visit Diagnosis: Unsteadiness on feet (R26.81);Other abnormalities of gait and mobility (R26.89);Muscle weakness (generalized) (M62.81);Other symptoms and signs involving the nervous system (R29.898);Difficulty in walking, not elsewhere classified (R26.2)     Time: [id number] PT Time Calculation (min) (ACUTE ONLY): 32 min  Charges:  $Gait Training: 8-22 mins $Therapeutic Activity: 8-22 mins                    G Codes:       Erline LevineKellyn Cailie Bosshart, PTA Pager: [phone]     Carolynne EdouardKellyn R Antoria Lanza 12/18/2017, 1:27 PM

## 2017-12-18 NOTE — Social Work (Signed)
CSW met with clinical liaison Yong Channel., RN, BSN, [phone]) of [facility], [facility], who will assist [facility] facility with patient needs when they return to assisted living facility.  CSW will continue to follow.  Elissa Hefty, LCSW Clinical Social Worker [phone]

## 2017-12-18 NOTE — [place] FL2 (Signed)
[place] MEDICAID FL2 LEVEL OF CARE SCREENING TOOL     IDENTIFICATION  Patient Name: [name] Birthdate: 1926-11-16 Sex: female Admission Date (Current Location): 12/10/2017  [facility] and [place] Number:  [place], television[place]film/video and Address:  The [place], [address], [place], [place] [address]      Provider Number: [id number]  Attending Physician Name and Address:  Tyson Alias, *  Relative Name and Phone Number:  Leann Mayweather, son [phone]    Current Level of Care: Hospital Recommended Level of Care: Skilled Nursing Facility Prior Approval Number:    Date Approved/Denied:   PASRR Number: [id number]  Discharge Plan: SNF    Current Diagnoses: Patient Active Problem List   Diagnosis Date Noted  . AKI (acute kidney injury) (HCC) 12/10/2017  . Esophageal dysmotility 12/09/2017  . Epidermoid cyst of skin 08/06/2017  . Bilateral lower extremity edema 06/07/2016  . Healthcare maintenance 01/13/2015  . Dementia of the Alzheimer's type 04/21/2013  . Iron deficiency anemia 08/29/2010  . CKD (chronic kidney disease) 06/06/2010  . VITAMIN D DEFICIENCY 12/07/2009  . Osteopenia 12/06/2009  . Gout 04/22/2007  . LOSS, SENSORINEURAL HEARING, BILAT 04/22/2007  . Essential hypertension 04/22/2007    Orientation RESPIRATION BLADDER Height & Weight     Self, Place  O2 Incontinent Weight: 137 lb 12.6 oz (62.5 kg) Height:  5' (152.4 cm)  BEHAVIORAL SYMPTOMS/MOOD NEUROLOGICAL BOWEL NUTRITION STATUS      Continent Diet(Dysphagia 1 thin liquids)  AMBULATORY STATUS COMMUNICATION OF NEEDS Skin   Extensive Assist Verbally Normal                       Personal Care Assistance Level of Assistance  Dressing, Bathing, Feeding Bathing Assistance: Maximum assistance Feeding assistance: Limited assistance Dressing Assistance: Maximum assistance     Functional Limitations Info  Hearing, Sight, Speech Sight Info: Adequate Hearing  Info: Impaired Speech Info: Adequate    SPECIAL CARE FACTORS FREQUENCY  PT (By licensed PT), OT (By licensed OT)     PT Frequency: 5x week OT Frequency: 5x week            Contractures      Additional Factors Info  Code Status, Allergies Code Status Info: Full Allergies Info: No Known Allergies           Current Medications (12/18/2017):  This is the current hospital active medication list Current Facility-Administered Medications  Medication Dose Route Frequency Provider Last Rate Last Dose  . 0.9 %  sodium chloride infusion  250 mL Intravenous PRN Valentino Nose, MD      . acetaminophen (TYLENOL) tablet 650 mg  650 mg Oral Q6H PRN Reymundo Poll, MD       Or  . acetaminophen (TYLENOL) suppository 650 mg  650 mg Rectal Q6H PRN Reymundo Poll, MD   650 mg at 12/17/17 2057  . dextrose 5 % solution   Intravenous Continuous Molt, Bethany, DO 75 mL/hr at 12/18/17 1241    . feeding supplement (ENSURE ENLIVE) (ENSURE ENLIVE) liquid 237 mL  237 mL Oral TID BM Molt, Bethany, DO   237 mL at 12/18/17 1244  . heparin injection 5,000 Units  5,000 Units Subcutaneous Q8H Valentino Nose, MD   5,000 Units at 12/18/17 1450  . labetalol (NORMODYNE,TRANDATE) injection 5 mg  5 mg Intravenous Q2H PRN Lacroce, Ames Coupe, MD      . ondansetron (ZOFRAN) tablet 4 mg  4 mg Oral Q6H PRN Guilloud,  Eber Jonesarolyn, MD       Or  . ondansetron [facility](ZOFRAN) injection 4 mg  4 mg Intravenous Q6H PRN Reymundo PollGuilloud, Carolyn, MD      . rivastigmine (EXELON) 4.6 mg/24hr 4.6 mg  4.6 mg Transdermal Daily Valentino NoseBoswell, Nathan, MD   4.6 mg at 12/18/17 1059  . senna-docusate (Senokot-S) tablet 1 tablet  1 tablet Oral QHS PRN Reymundo PollGuilloud, Carolyn, MD      . sodium chloride flush (NS) 0.9 % injection 3 mL  3 mL Intravenous Q12H Valentino NoseBoswell, Nathan, MD   3 mL at 12/18/17 1055  . sodium chloride flush (NS) 0.9 % injection 3 mL  3 mL Intravenous PRN Valentino NoseBoswell, Nathan, MD         Discharge Medications: Please see discharge summary for a  list of discharge medications.  Relevant Imaging Results:  Relevant Lab Results:   Additional Information SS#: [id number]  Tresa MoorePatricia V Trenae Brunke, LCSW

## 2017-12-18 NOTE — Social Work (Signed)
CSW called [facility] and discussed patient's care needs with Lucky Rathkeevlin, care coordinator.  She advised that if patient not walking independently and will now need wheelchair at discharge she cannot return to the special care unit/memory unit. She indicated that patient would need to go to another floor that handles patients needing assistance with wheelchair and she would need to follow up with administration before approving patient's return to facility with comfort care. CSW advised staff that son would prefer his mom to return to facility with comfort care. Lucky RathkeDevlin indicated she would follow up and let CSW know the outcome.    CSW will f/u.  Keene BreathPatricia Vahe Pienta, LCSW Clinical Social Worker [phone]

## 2017-12-18 NOTE — Progress Notes (Signed)
Internal Medicine Attending:   I saw and examined the patient. I reviewed the resident's note and I agree with the resident's findings and plan as documented in the resident's note.  84 year old woman hospital day #8 with advanced dementia leading to inadequate oral intake and acute on chronic renal failure. She looks well this morning, electrolytes are corrected on IV fluids, but she is still not taking oral food or water. She is not interactive with our exam because of dementia. She does appear calm, pleasant, and comfortable. We are working with family and palliative care service about disposition, her prognosis is limited by her dementia and renal failure.

## 2017-12-18 NOTE — Consult Note (Signed)
Consultation Note Date: 12/18/2017   Patient Name: [name]  DOB: 19-Aug-1926  MRN: [id number]  Age / Sex: 84 y.o., female  PCP: Rozann Lesches, MD Referring Physician: Tyson Alias, *  Reason for Consultation: Establishing goals of care and Psychosocial/spiritual support  HPI/Patient Profile: 84 y.o. female   admitted on 12/10/2017 with  PMH significant for Alzheimer's dementia, esophageal stricture in 2014 s/p dilation, CKD, HTN, and colon cancer s/p right hemicolectomy in 1991 who presents as a direct admit for acute elevation in creatinine.  She was seen in the IMTS clinic on 12/09/2017 with complaints of difficulty swallowing and decreased PO intake at her long term care facility, thought to be secondary to progression of dementia.  She has lost 15lbs since October 2018. Son/Facility reports slow continued physical, functional ad cognitive decline over the past few months.  Family face advanced directive decisions,EOL decsions and anticipatory care need.  Clinical Assessment and Goals of Care:  This NP Lorinda Creed reviewed medical records, received report from team, assessed the patient and then meet at the patient's bedside along with her son/[name]  to discuss diagnosis, prognosis, GOC, EOL wishes disposition and options.  Detailed her worsening renal function, and esophageal dysmotility.  Dialysis and artifical feeding/PEG not recommended 2/2 to advanced dementia and overall failure to thrive.  Concept of Hospice and Palliative Care were discussed  A  discussion was had today regarding advanced directives.  Concepts specific to code status and rehospitalization was had.  The difference between a aggressive medical intervention path  and a palliative comfort care path for this patient at this time was had.  Values and goals of care important to patient and family were attempted  to be elicited.   Natural trajectory and expectations at EOL were discussed.  Questions and concerns addressed.   Family encouraged to call with questions or concerns.    PMT will continue to support holistically.  NEXT OF KIN    SUMMARY OF RECOMMENDATIONS    Code Status/Advance Care Planning:  DNRdocumented today   Symptom Management:   Dysphagia: no desire for artificial feeding now or in the future  Comfort feeds with known risk of aspiration  Palliative Prophylaxis:   Aspiration, Bowel Regimen, Delirium Protocol, Frequent Pain Assessment and Oral Care  Additional Recommendations (Limitations, Scope, Preferences):  Leaning toward a full comfort path once son is able to speak to the rest of the family  This Np will call son in the morning for clarification of GOC  Psycho-social/Spiritual:   Desire for further Chaplaincy support:yes  Additional Recommendations: Education on Hospice  Prognosis:    Once a full comfort path is in place likely less than a 2 months  Discharge Planning:  Family prefers patient to return to [facility] with hospice services in place if possible Social work checking into eligibility    To Be Determined      Primary Diagnoses: Present on Admission: . Dementia of the Alzheimer's type . Esophageal dysmotility . Essential hypertension . AKI (acute  kidney injury) (HCC) . CKD (chronic kidney disease)   I have reviewed the medical record, interviewed the patient and family, and examined the patient. The following aspects are pertinent.  Past Medical History:  Diagnosis Date  . Cholelithiasis 01/13/2015  . CKD (chronic kidney disease)   . Diverticulosis of colon   . Elevated alkaline phosphatase level    Chronic; GGT was normal (30) on 09/18/2004.  . Endometrial polyp    S/P operative hysteroscopy with removal of polyps, dilatation and curettage on 10/20/2000 by Dr. Antionette Char.  . Esophageal stricture   . Gastric  ulcer 1991   H. pylori positive in 1991; EGD 01/31/00 by Dr. Juanda Chance showed mild benign distal esophageal stricture (dilator passed), gastritis with evidence of coffee-ground material, and a 4 cm non-reducible hiatal hernia; EGD  02/09/2008 showed hiatal hernia, and mild benign distal espohageal stricture, was otherwise normal.  . Gout   . Hiatal hernia   . Hypertension   . Iron deficiency anemia   . Lower GI bleed   . Malignant neoplasm of colon 1991   S/P right hemicolectomy in 1991 by Dr. Jamey Ripa, reportedly done for a malignant polyp; colonoscopy by Dr. [name] 02/09/2008 showed  marked left colon diverticulosis with partial narrowing, S/P  remote right hemicolectomy.  . Osteopenia    DXA bone density scan on 10/16/2009 showed osteopenia with an AP spine T-score of -1.5, left femoral neck T-score of -1.7, and right femoral neck T-score of -2.0.  Her FRAX score gave an estimated ten-year probability of major osteoporotic fracture of 7.3%, and a ten-year probability of hip fracture of 2.1%.  . Sensorineural hearing loss of both ears    Audiological evaluation on February 05, 2007 showed a slight to mild loss in both ears through 4000 Hz, with a moderate to severe precipitous drop in the highest frequencies (6000 to 8000 Hz).  . Tinnitus, bilateral    Audiological evaluation on February 05, 2007 showed a slight to mild loss of hearing in both ears through 4000 Hz, with a moderate to severe precipitous drop in the highest frequencies (6000 to 8000 Hz); her speech recognition abilities were within normal limits and a slightly elevated conversational level.  [name] Unsteady gait 08/07/2011  . Vitamin D deficiency    Vitamin D (25-Hydroxy) <10 ng/mL on 12/07/2009.   Social History   Socioeconomic History  . Marital status: Widowed    Spouse name: None  . Number of children: None  . Years of education: None  . Highest education level: None  Social Needs  . Financial resource strain: None  . Food insecurity -  worry: None  . Food insecurity - inability: None  . Transportation needs - medical: None  . Transportation needs - non-medical: None  Occupational History    Employer: RETIRED    Comment: retired  Tobacco Use  . Smoking status: Never Smoker  . Smokeless tobacco: Never Used  Substance and Sexual Activity  . Alcohol use: No    Alcohol/week: 0.0 oz  . Drug use: No  . Sexual activity: Not Currently  Other Topics Concern  . None  Social History Narrative  . None   Family History  Problem Relation Age of Onset  . Breast cancer Maternal Aunt   . Colon cancer Neg Hx    Scheduled Meds: . feeding supplement (ENSURE ENLIVE)  237 mL Oral TID BM  . heparin  5,000 Units Subcutaneous Q8H  . rivastigmine  4.6 mg Transdermal Daily  . sodium  chloride flush  3 mL Intravenous Q12H   Continuous Infusions: . sodium chloride    . dextrose 75 mL/hr at 12/17/17 0859   PRN Meds:.sodium chloride, acetaminophen **OR** acetaminophen, labetalol, ondansetron **OR** ondansetron (ZOFRAN) IV, senna-docusate, sodium chloride flush Medications Prior to Admission:  Prior to Admission medications   Medication Sig Start Date End Date Taking? Authorizing Provider  allopurinol (ZYLOPRIM) 100 MG tablet Take 2 tablets (200 mg total) daily by mouth. 10/07/17  Yes NedrudJeanella Flattery, Marybeth, MD  Calcium Carbonate-Vitamin D 600-400 MG-UNIT tablet Take 1 tablet by mouth 2 (two) times daily. 05/25/17  Yes Ahmed, Elyn Peersasrif, MD  colchicine 0.6 MG tablet Take 0.5 tablets (0.3 mg total) by mouth daily as needed (For use only during gout flare). 10/27/17 10/27/18 Yes Nedrud, Jeanella FlatteryMarybeth, MD  diltiazem (TIAZAC) 420 MG 24 hr capsule TAKE ONE CAPSULE BY MOUTH EVERY DAY Patient taking differently: TAKE 420 mg CAPSULE BY MOUTH EVERY DAY 11/29/17  Yes Nedrud, Jeanella FlatteryMarybeth, MD  furosemide (LASIX) 80 MG tablet Take 1 tablet (80 mg total) by mouth 2 (two) times daily. 06/25/17 06/25/18 Yes Nedrud, Jeanella FlatteryMarybeth, MD  guaifenesin (ROBITUSSIN) 100 MG/5ML syrup  Take 200 mg by mouth every 6 (six) hours as needed for cough.   Yes [provider]  loperamide (IMODIUM A-D) 2 MG tablet Take 2 mg by mouth 4 (four) times daily as needed for diarrhea or loose stools.   Yes [provider]  losartan (COZAAR) 25 MG tablet Take 1 tablet (25 mg total) daily by mouth. 10/07/17  Yes Nedrud, Jeanella FlatteryMarybeth, MD  magnesium hydroxide (MILK OF MAGNESIA) 400 MG/5ML suspension Take 30 mLs by mouth daily as needed for mild constipation.   Yes [provider]  memantine (NAMENDA) 10 MG tablet Take 1 tablet (10 mg total) daily by mouth. 10/07/17  Yes NedrudJeanella Flattery, Marybeth, MD  neomycin-bacitracin-polymyxin (NEOSPORIN) ointment Apply 1 application topically as needed for wound care.   Yes [provider]  potassium chloride (KLOR-CON 10) 10 MEQ tablet Take 1 tablet (10 mEq total) by mouth daily. 07/30/17  Yes Nedrud, Jeanella FlatteryMarybeth, MD  rivastigmine (EXELON) 4.6 mg/24hr PLACE 1 PATCH (4.6 MG TOTAL) ONTO THE SKIN DAILY 12/09/17  Yes Ginger CarneHarden, Kyler, MD   No Known Allergies Review of Systems  Unable to perform ROS: Dementia    Physical Exam  Constitutional: She appears well-developed.  Cardiovascular: Normal rate and regular rhythm.  Neurological: She is alert.  -pleasantly confused  Skin: Skin is warm and dry.    Vital Signs: BP 119/74 (BP Location: Right Arm)   Pulse 88   Temp 98.1 F (36.7 C) (Axillary)   Resp 17   Ht 5' (1.524 m)   Wt 62.5 kg (137 lb 12.6 oz)   SpO2 99%   BMI 26.91 kg/m  Pain Assessment: PAINAD   Pain Score: 0-No pain   SpO2: SpO2: 99 % O2 Device:SpO2: 99 % O2 Flow Rate: .O2 Flow Rate (L/min): 2 L/min  IO: Intake/output summary:   Intake/Output Summary (Last 24 hours) at 12/18/2017 1140 Last data filed at 12/18/2017 0900 Gross per 24 hour  Intake 535 ml  Output 650 ml  Net -115 ml    LBM: Last BM Date: 12/13/17 Baseline Weight: Weight: 61.5 kg (135 lb 9.3 oz) Most recent weight: Weight: 62.5 kg (137 lb 12.6 oz)       Palliative Assessment/Data: 30 % at best   Discussed with Internal Medicine Resident  Time In: 1000 Time Out: 1115 Time Total: 75 min Greater than 50%  of this  time was spent counseling and coordinating care related to the above assessment and plan.  Signed by: Lorinda Creed, NP   Please contact Palliative Medicine Team phone at [phone] for questions and concerns.  For individual provider: See Loretha Stapler

## 2017-12-19 MED ORDER — ENSURE ENLIVE PO LIQD: 237.0000 mL | Freq: Three times a day (TID) | ORAL | 1 refills | Status: AC

## 2017-12-19 NOTE — Social Work (Addendum)
[facility] will [facility]; CSW confirmed with Alvino ChapelEllen, clinical liaison, that patient can return to SNF.  CSW awaiting for disposition and will f/u for transport to SNF at that time.  CSW called son and made him aware that patient will return to assisted living when ready possible today or tomorrow morning. Son in agreement.  Keene BreathPatricia Eliakim Tendler, LCSW Clinical Social Worker [phone]

## 2017-12-19 NOTE — [place] FL2 (Signed)
[place] MEDICAID FL2 LEVEL OF CARE SCREENING TOOL     IDENTIFICATION  Patient Name: [name] Birthdate: 02/05/1926 Sex: female Admission Date (Current Location): 12/10/2017  [facility] [place]  [place]   Facility and Address:  [facility] [place], [address], [place], [place] [address]      Provider Number: [id number]  Attending Physician Name and Address:  Tyson AliasVincent, Duncan Thomas, *  Relative Name and Phone Number:  Kyra Leylandlexander Buccieri, son [phone]    Current Level of Care: Hospital Recommended Level of Care: Assisted Living Facility, Memory Care Prior Approval Number:    Date Approved/Denied:   PASRR Number: [id number]  Discharge Plan: Other (Comment)(Assisted Living-Memory Care)    Current Diagnoses: Patient Active Problem List   Diagnosis Date Noted  . Dysphagia   . DNR (do not resuscitate)   . Palliative care by specialist   . AKI (acute kidney injury) (HCC) 12/10/2017  . Esophageal dysmotility 12/09/2017  . Epidermoid cyst of skin 08/06/2017  . Bilateral lower extremity edema 06/07/2016  . Healthcare maintenance 01/13/2015  . Dementia of the Alzheimer's type 04/21/2013  . Iron deficiency anemia 08/29/2010  . CKD (chronic kidney disease) 06/06/2010  . VITAMIN D DEFICIENCY 12/07/2009  . Osteopenia 12/06/2009  . Gout 04/22/2007  . LOSS, SENSORINEURAL HEARING, BILAT 04/22/2007  . Essential hypertension 04/22/2007    Orientation RESPIRATION BLADDER Height & Weight     Self, Place  O2 Incontinent Weight: 137 lb 12.6 oz (62.5 kg) Height:  5' (152.4 cm)  BEHAVIORAL SYMPTOMS/MOOD NEUROLOGICAL BOWEL NUTRITION STATUS      Continent Diet(Dysphagia 1 thin liquids)  AMBULATORY STATUS COMMUNICATION OF NEEDS Skin   Extensive Assist Verbally Normal                       Personal Care Assistance Level of Assistance  Dressing, Bathing, Feeding Bathing Assistance: Maximum assistance Feeding assistance: Limited  assistance Dressing Assistance: Maximum assistance     Functional Limitations Info  Hearing, Sight, Speech Sight Info: Adequate Hearing Info: Impaired Speech Info: Adequate    SPECIAL CARE FACTORS FREQUENCY  PT (By licensed PT), OT (By licensed OT)     PT Frequency: 5x week OT Frequency: 5x week            Contractures      Additional Factors Info  Code Status, Allergies Code Status Info: Full Allergies Info: No Known Allergies           Current Medications (12/19/2017):  This is the current hospital active medication list Current Facility-Administered Medications  Medication Dose Route Frequency Provider Last Rate Last Dose  . 0.9 %  sodium chloride infusion  250 mL Intravenous PRN Valentino NoseBoswell, Nathan, MD      . acetaminophen (TYLENOL) tablet 650 mg  650 mg Oral Q6H PRN Reymundo PollGuilloud, Carolyn, MD       Or  . acetaminophen (TYLENOL) suppository 650 mg  650 mg Rectal Q6H PRN Reymundo PollGuilloud, Carolyn, MD   650 mg at 12/17/17 2057  . feeding supplement (ENSURE ENLIVE) (ENSURE ENLIVE) liquid 237 mL  237 mL Oral TID BM Molt, Bethany, DO   237 mL at 12/19/17 1623  . heparin injection 5,000 Units  5,000 Units Subcutaneous Q8H Valentino NoseBoswell, Nathan, MD   5,000 Units at 12/19/17 1449  . labetalol (NORMODYNE,TRANDATE) injection 5 mg  5 mg Intravenous Q2H PRN Lacroce, Ames CoupeSamantha J, MD      . ondansetron [facility](ZOFRAN) tablet 4 mg  4 mg Oral  Q6H PRN Reymundo Poll, MD       Or  . ondansetron [facility]) injection 4 mg  4 mg Intravenous Q6H PRN Reymundo Poll, MD      . rivastigmine (EXELON) 4.6 mg/24hr 4.6 mg  4.6 mg Transdermal Daily Valentino Nose, MD   4.6 mg at 12/19/17 0831  . senna-docusate (Senokot-S) tablet 1 tablet  1 tablet Oral QHS PRN Reymundo Poll, MD      . sodium chloride flush (NS) 0.9 % injection 3 mL  3 mL Intravenous Q12H Valentino Nose, MD   3 mL at 12/19/17 0826  . sodium chloride flush (NS) 0.9 % injection 3 mL  3 mL Intravenous PRN Valentino Nose, MD         Discharge  Medications: Please see discharge summary for a list of discharge medications.  Relevant Imaging Results:  Relevant Lab Results:   Additional Information SS#: [id number]  Tresa Moore, LCSW

## 2017-12-19 NOTE — Social Work (Signed)
CSW called clinical liaison Alvino Chapelllen from nursing facility to f/u on disposition and to ensure that they would be ready for patient. Nursing facility will have  hospice follow patient at facility.  CSW awaiting f/u from clinical team if patient would discharge [facility] today.  CSW will continue to follow.  Keene BreathPatricia Zamora Colton, LCSW Clinical Social Worker [phone]

## 2017-12-19 NOTE — Progress Notes (Signed)
Patient pulled out IV. Attempt x 2 to replace without success.

## 2017-12-19 NOTE — Progress Notes (Signed)
Sent page to MD, MD returned call, MD aware of pending discharge.

## 2017-12-19 NOTE — Progress Notes (Signed)
   Subjective: [name] was seen resting in her bed this morning. She continues to remain pleasantly demented and her   Objective:  Vital signs in last 24 hours: Vitals:   12/18/17 1300 12/18/17 2050 12/19/17 0100 12/19/17 0628  BP: (!) 119/108 (!) 144/94 123/79 116/86  Pulse: 81 93 91 91  Resp: 16 16  16   Temp: 98.4 F (36.9 C) 98 F (36.7 C)  (!) 97.5 F (36.4 C)  TempSrc: Axillary Axillary  Axillary  SpO2: 100% 100%  98%  Weight:      Height:       Physical Exam  Constitutional: She appears well-developed and well-nourished. No distress.  HENT:  Head: Normocephalic and atraumatic.  Eyes: Conjunctivae are normal.  Cardiovascular: Normal rate, regular rhythm, normal heart sounds and intact distal pulses.  Respiratory: Effort normal and breath sounds normal. No respiratory distress. She has no wheezes.  GI: Soft. Bowel sounds are normal. She exhibits no distension. There is no tenderness.  Musculoskeletal:  Enlarged legs bilaterally without pitting  Neurological: She is alert.  Skin: She is not diaphoretic.     Assessment/Plan:  84 year old female with a history of esophageal stricture status post dilation in 2014 and CKD stage IV whowas found to have AKI while undergoing outpatient work-up for dysphagia.  Acute on chronic kidney disease, likely dehydration Hypernatremia AKI is thought to be due to pre-renal volume depletion. The patient's creatinine on 12/18/17 was4.23. Did not get labs today.   Esophageal dysmotility The patient has distal esophageal dysmotility noted on upper GI series. Palliative care consulted palliative care to discuss goals of care as patient is unlikely to benefit from PEG tube.  Essential Hypertension The patient's blood pressure this morning was 116/86.  -Continuing to hold lasix, losartan, diltiazem  Alzheimer's Dementia - Continue rivastigmine - Continue delirium precautions  Dispo: Anticipated discharge in approximately  0-1 day. Family decision regarding discharge disposition is pending.   Lorenso CourierVahini Nga Rabon, MD Internal Medicine PGY1 Pager:[phone] 12/19/2017, 9:49 AM

## 2017-12-19 NOTE — Social Work (Signed)
CSW updated FL2 per nursing facility request. CSW sent to MD to cosign.  CSW will send updated FL2 to nursing facility once co-signed.  Keene BreathPatricia Klohe Lovering, LCSW Clinical Social Worker [phone]

## 2017-12-19 NOTE — Progress Notes (Signed)
On Call MD was notified of the patient pulling out her IV after numerous times.  Verbal order received that the Nursing staff may leave the IV out.

## 2017-12-19 NOTE — Progress Notes (Addendum)
Palliative Medicine RN Note: Rec'd VM from Dr Delma Officerhundi requesting clarification of notes/family goals. PMT NP Lorinda CreedMary Larach is off service until Monday.  I reviewed Luvada's note from yesterday that states goal is NF with hospice and the SW note from today setting up d/c to SNF with hospice to follow there. I also called Corrie DandyMary, and she reiterated the plan, noting that she spoke to SW this am.  I spoke with Dr Delma Officerhundi and provided update  Margret ChanceMelanie G. Xareni Kelch, RN, BSN, [facility] Palliative Medicine Team 12/19/2017 1:03 PM Office [phone]

## 2017-12-20 NOTE — NC FL2 (Signed)
[place] MEDICAID FL2 LEVEL OF CARE SCREENING TOOL     IDENTIFICATION  Patient Name: Meghan CuretMary M Banos Birthdate: 04/08/1926 Sex: female Admission Date (Current Location): 12/10/2017  [facility] [place]   Facility and Address:  [facility] [place], [address], [place], [place] [address]      Provider Number: [id number]  Attending Physician Name and Address:  Earl LagosNarendra, Nischal, MD  Relative Name and Phone Number:  Kyra Leylandlexander Rossner, son [phone]    Current Level of Care: Hospital Recommended Level of Care: Memory Care Prior Approval Number:    Date Approved/Denied:   PASRR Number: [id number]  Discharge Plan: Other (Comment)(Assisted Living-Memory Care)    Current Diagnoses: Patient Active Problem List   Diagnosis Date Noted  . Dysphagia   . DNR (do not resuscitate)   . Palliative care by specialist   . AKI (acute kidney injury) (HCC) 12/10/2017  . Esophageal dysmotility 12/09/2017  . Epidermoid cyst of skin 08/06/2017  . Bilateral lower extremity edema 06/07/2016  . Healthcare maintenance 01/13/2015  . Dementia of the Alzheimer's type 04/21/2013  . Iron deficiency anemia 08/29/2010  . CKD (chronic kidney disease) 06/06/2010  . VITAMIN D DEFICIENCY 12/07/2009  . Osteopenia 12/06/2009  . Gout 04/22/2007  . LOSS, SENSORINEURAL HEARING, BILAT 04/22/2007  . Essential hypertension 04/22/2007    Orientation RESPIRATION BLADDER Height & Weight     Self, Place  O2 Incontinent Weight: 137 lb 12.6 oz (62.5 kg) Height:  5' (152.4 cm)  BEHAVIORAL SYMPTOMS/MOOD NEUROLOGICAL BOWEL NUTRITION STATUS      Continent Diet(Dysphagia 1 thin liquids)  AMBULATORY STATUS COMMUNICATION OF NEEDS Skin   Extensive Assist Verbally Normal                       Personal Care Assistance Level of Assistance  Dressing, Bathing, Feeding Bathing Assistance: Maximum assistance Feeding assistance: Limited assistance Dressing Assistance: Maximum  assistance     Functional Limitations Info  Hearing, Sight, Speech Sight Info: Adequate Hearing Info: Impaired Speech Info: Adequate    SPECIAL CARE FACTORS FREQUENCY  PT (By licensed PT), OT (By licensed OT)     PT Frequency: HH Nursing PT OT Frequency: HH Nursing OT            Contractures      Additional Factors Info  Code Status, Allergies Code Status Info: Full Allergies Info: No Known Allergies           Current Medications (12/20/2017):  This is the current hospital active medication list Current Facility-Administered Medications  Medication Dose Route Frequency Provider Last Rate Last Dose  . 0.9 %  sodium chloride infusion  250 mL Intravenous PRN Valentino NoseBoswell, Nathan, MD      . acetaminophen (TYLENOL) tablet 650 mg  650 mg Oral Q6H PRN Reymundo PollGuilloud, Carolyn, MD       Or  . acetaminophen (TYLENOL) suppository 650 mg  650 mg Rectal Q6H PRN Reymundo PollGuilloud, Carolyn, MD   650 mg at 12/19/17 1701  . feeding supplement (ENSURE ENLIVE) (ENSURE ENLIVE) liquid 237 mL  237 mL Oral TID BM Molt, Bethany, DO   237 mL at 12/20/17 1319  . heparin injection 5,000 Units  5,000 Units Subcutaneous Q8H Valentino NoseBoswell, Nathan, MD   5,000 Units at 12/20/17 1322  . labetalol (NORMODYNE,TRANDATE) injection 5 mg  5 mg Intravenous Q2H PRN Lacroce, Ames CoupeSamantha J, MD      . ondansetron (ZOFRAN) tablet 4 mg  4 mg Oral Q6H PRN  Reymundo Poll, MD       Or  . ondansetron [facility]) injection 4 mg  4 mg Intravenous Q6H PRN Reymundo Poll, MD   4 mg at 12/19/17 1652  . rivastigmine (EXELON) 4.6 mg/24hr 4.6 mg  4.6 mg Transdermal Daily Valentino Nose, MD   4.6 mg at 12/20/17 1002  . senna-docusate (Senokot-S) tablet 1 tablet  1 tablet Oral QHS PRN Reymundo Poll, MD      . sodium chloride flush (NS) 0.9 % injection 3 mL  3 mL Intravenous Q12H Valentino Nose, MD   3 mL at 12/19/17 0826  . sodium chloride flush (NS) 0.9 % injection 3 mL  3 mL Intravenous PRN Valentino Nose, MD         Discharge  Medications: Please see discharge summary for a list of discharge medications.  Relevant Imaging Results:  Relevant Lab Results:   Additional Information SS#: [id number] [place], [place]

## 2017-12-20 NOTE — Plan of Care (Signed)
  Activity: Risk for activity intolerance will decrease 12/20/2017 1854 - Progressing by Darrow BussingArcilla, Kayline Sheer M, RN   Nutrition: Adequate nutrition will be maintained 12/20/2017 1854 - Progressing by Darrow BussingArcilla, Caitlin Ainley M, RN   Elimination: Will not experience complications related to bowel motility 12/20/2017 1854 - Progressing by Darrow BussingArcilla, Buffy Ehler M, RN

## 2017-12-20 NOTE — Progress Notes (Addendum)
CSW following for discharge planning. CSW coordinated with Alvino ChapelEllen, Clinical Nurse Liaison, to discuss patient discharge and return to [facility]. CSW sent clinical information over to office, and received contact for Shaketa at the facility. CSW contacted Shaketa, facility nurse; she has the day off, there is no one available to accept the patient today. CSW contacted Alvino Chapelllen to provide information; Alvino Chapelllen says she is trying to find someone else who can go to the facility in order to admit the patient back today.  CSW will await contact back from facility that there will be nursing staff on site to admit the patient back from the hospital when she returns. CSW will continue to follow.  Blenda NicelyElizabeth Violett Hobbs, LCSW Clinical Social Worker [phone]   UPDATE 5:00 PM:  CSW spoke with Alvino Chapelllen, who said she got in touch with [place] at the facility [phone]) who can assist with getting the patient back today. Suzette BattiestVeronica was concerned about patient's current status, had not received clinical information faxed previously because no one is in the business office. CSW received new fax number and sent information. Suzette BattiestVeronica said that she would need to review and see if facility could still get her medications and everything since it was so late in the day, will call CSW back.  CSW will continue to follow. Patient may not be able to return to facility tonight, due to staffing issues at the facility and inability to get patient's medications due to the time of day. CSW will alert MD and RN.  Blenda NicelyElizabeth Cerise Lieber, [place] Clinical Social Worker [phone]

## 2017-12-20 NOTE — Progress Notes (Signed)
   Subjective: Patient doing well this morning. She was unable to be discharged to a skilled nursing facility yesterday due to transportation issues. She rested well and states that she is in no pain. She voices that she is ready to leave. We discussed that she will be discharged today once transportation is arranged. She voices understanding. All questions and concerns addressed.  Objective: Vital signs in last 24 hours: Vitals:   12/19/17 1100 12/19/17 1400 12/19/17 2127 12/20/17 0538  BP:  124/85 123/74 120/76  Pulse:  93 94 91  Resp: 20   16  Temp:  (!) 95.5 F (35.3 C) 97.8 F (36.6 C) 97.7 F (36.5 C)  TempSrc:  Axillary Oral Axillary  SpO2:  98% 97% 97%  Weight:      Height:       General: Elderly female in no acute distress Pulm: Diminished breath sounds bilaterally, no crackles or wheezing heard CV: Rate and rhythm, no murmurs or rubs heard Abdomen: Active bowel sounds, soft, nondistended, no tenderness to palpation  Assessment/Plan:  84 year old female with a history of esophageal stricture status post dilation in 2014 and CKD stage IV whowas found to have AKI while undergoing outpatient work-up for dysphagia.She would be discharged to skilled nursing facility today once transportation is arranged. No other changes made to her medical management at this point.  Acute on chronic kidney disease, likely dehydration Hypernatremia AKI is thought to be due to pre-renal volume depletion. The patient's creatinine on 12/18/17 was4.23. Did not get labs today.   Esophageal dysmotility The patient has distal esophageal dysmotility noted on upper GI series. Palliative care consultedpalliative care to discuss goals of careas patient is unlikely to benefit from PEG tube.  Essential Hypertension The patient's blood pressure this morning was 116/86. -Continuing to hold lasix, losartan, diltiazem  Alzheimer's Dementia - Continue rivastigmine - Continue delirium  precautions  Dispo: Anticipated discharge to SNF today once transportation arranged.  Levora DredgeHelberg, Lynda Wanninger, MD 12/20/2017, 6:54 AM My Pager: [phone]

## 2017-12-20 NOTE — Care Management Note (Signed)
Case Management Note  Patient Details  Name: Trinidad CuretMary M Hedgepath MRN: [id number] Date of Birth: 06/04/1926  Subjective/Objective:                 DC ALF as facilitated by CSW.    Action/Plan:   Expected Discharge Date:  12/19/17               Expected Discharge Plan:  Assisted Living / Rest Home  In-House Referral:  Clinical Social Work  Discharge planning Services  CM Consult  Post Acute Care Choice:    Choice offered to:     DME Arranged:    DME Agency:     HH Arranged:    HH Agency:     Status of Service:  Completed, signed off  If discussed at [employer] Meetings, dates discussed:    Additional Comments:  Lawerance SabalDebbie Kalel Harty, RN 12/20/2017, 7:42 AM

## 2017-12-21 DIAGNOSIS — K449 Diaphragmatic hernia without obstruction or gangrene: Secondary | ICD-10-CM

## 2017-12-21 DIAGNOSIS — I129 Hypertensive chronic kidney disease with stage 1 through stage 4 chronic kidney disease, or unspecified chronic kidney disease: Secondary | ICD-10-CM

## 2017-12-21 DIAGNOSIS — N189 Chronic kidney disease, unspecified: Secondary | ICD-10-CM

## 2017-12-21 DIAGNOSIS — R1319 Other dysphagia: Secondary | ICD-10-CM

## 2017-12-21 DIAGNOSIS — G308 Other Alzheimer's disease: Secondary | ICD-10-CM

## 2017-12-21 NOTE — Progress Notes (Signed)
CSW following for discharge plan. CSW contacted Alvino Chapelllen, clinical liaison, to determine if patient could admit today now that facility has had all clinical information. Alvino Chapelllen confirmed that she had someone at the facility that had reviewed all information and the patient could admit back today. Alvino Chapelllen apologized for the delay and the confusion in staff changes.   CSW will alert MD and RN to prepare patient for discharge back to facility.  Blenda NicelyElizabeth Minsa [place], [place] Clinical Social Worker [phone]

## 2017-12-21 NOTE — Progress Notes (Signed)
RN called [facility] and gave report to patricia,RN. Pt comfortable and not in distress awaiting PTAR for pickup.

## 2017-12-21 NOTE — Progress Notes (Signed)
Internal Medicine Attending:   I saw and examined the patient. I reviewed the resident's note and I agree with the resident's findings and plan as documented in the resident's note.  Patient feels well today with no new complaints. No further workup for now.  Patient is stable for discharge to SNF today.

## 2017-12-21 NOTE — Progress Notes (Signed)
   Subjective: Patient doing well today. States that she is in no pain.  Discussed discharge to skilled nursing facility. She agrees. All questions and concerns addressed.  Patient is stable for discharge.  Objective: Vital signs in last 24 hours: Vitals:   12/20/17 1600 12/20/17 2036 12/21/17 0500 12/21/17 0516  BP: 106/71 117/86  120/74  Pulse: 89 (!) 103  92  Resp:      Temp: 98.6 F (37 C) 98.5 F (36.9 C)  98.2 F (36.8 C)  TempSrc: Axillary Oral  Oral  SpO2: 98% 100%  96%  Weight:   135 lb 5.8 oz (61.4 kg)   Height:       General: Elderly female in no acute distress Pulm: Diminished breath sounds bilaterally, no crackles or wheezing is heard CV: Regular rate and rhythm, no murmurs, no rubs Abdomen: Active bowel sounds, soft, nondistended, no tenderness to palpation  Assessment/Plan:  84 year old female with a history of esophageal stricture status post dilation in 2014 and CKD stage IV whowas found to have AKI while undergoing outpatient work-up for dysphagia.She would be discharged to skilled nursing facility today once transportation is arranged. No other changes made to her medical management at this point.  Acute on chronic kidney disease, likely dehydration Hypernatremia AKI is thought to be due to pre-renal volume depletion. The patient's creatinineon 12/18/17 was4.23. Did not get labs today.  Esophageal dysmotility The patient has distal esophageal dysmotility noted on upper GI series. Palliative care consultedpalliative care to discuss goals of careas patient is unlikely to benefit from PEG tube.  Essential Hypertension The patient's blood pressure this morning was 116/86. -Continuing to hold lasix, losartan, diltiazem  Alzheimer's Dementia - Continue rivastigmine - Continue delirium precautions  Dispo: Patient stable for discharge. Anticipate discharge to SNF today.   Levora DredgeHelberg, Mattox Schorr, MD 12/21/2017, 6:49 AM My Pager: [phone]

## 2017-12-21 NOTE — Progress Notes (Signed)
[facility] Anticipated discharge date: 12/21/17 Transportation by: PTAR  Report #: [id number]  CSW signing off.  Blenda Nicelylizabeth Markee Matera LCSW [phone]

## 2017-12-23 ENCOUNTER — Telehealth: Payer: Self-pay | Admitting: Internal Medicine

## 2017-12-23 NOTE — Telephone Encounter (Signed)
Yvette from hosppice is calling to receive an order for the patient. Pls call her at [phone]

## 2017-12-23 NOTE — Telephone Encounter (Signed)
Return call made to Hospice(Yvette)-states that received a "Hospice Referral" from [facility].  Per hospice, the referral will need to come from pt's pcp (attending) office.  After reviewing pt's most recent hospitalization, a hospice referral would be appropriate.  Hospice can take a verbal authorization, but will need attending MD for billing purposes.  Will send chart to pcp and attending for review, please advise.Kingsley SpittleGoldston, Ellysa Parrack Cassady1/22/20192:28 PM    Is it ok to give verbal authorization to begin hospice services? .[name]

## 2017-12-23 NOTE — Telephone Encounter (Signed)
I agree that after reviewing the chart Hospice is appropriate.  I am happy to provide a verbal order, but the attending is to be Dr. Sandre Kittyaines, who was the attending that cared for her during that inpatient admission when hospice was decided upon.  Thanks.

## 2017-12-24 ENCOUNTER — Telehealth: Payer: Self-pay | Admitting: Internal Medicine

## 2017-12-24 NOTE — Telephone Encounter (Signed)
Bronson IngYvette is calling to get contact information for Dr. Sandre Kittyaines, our information isn't update

## 2017-12-25 NOTE — Addendum Note (Signed)
Addended by: Neomia DearPOWERS, Ryne Mctigue E on: 12/25/2017 07:25 PM   Modules accepted: Orders

## 2017-12-29 ENCOUNTER — Telehealth: Payer: Self-pay

## 2017-12-29 NOTE — Telephone Encounter (Signed)
[name] with [facility] requesting VO for skilled nurse. Please call back.

## 2017-12-29 NOTE — Telephone Encounter (Signed)
Diane RN [facility] - requesting verbal order for "Skilled nursing at least 2 times a week x 3 weeks to evaluate poor po intake, falls and dementia". VO given - if not ok let me know.

## 2017-12-30 NOTE — Telephone Encounter (Signed)
I agree with your verbal order. Thanks. Meghan FlatteryMarybeth

## 2018-01-30 DEATH — deceased

## 2018-05-20 ENCOUNTER — Encounter: Payer: Self-pay | Admitting: *Deleted

## 2018-06-12 IMAGING — RF DG ESOPHAGUS
8 of 9 series · 14 of 23 positions shown · non-contrast
Comparison: CT abdomen pelvis dated June 16, 2011.

CLINICAL DATA: Decreased p.o. intake with dysphagia and chest pain
while eating. History of Alzheimer's disease. Prior esophageal
stricture and dilatation in 1203.

EXAM:
ESOPHOGRAM/BARIUM SWALLOW
TECHNIQUE: Single contrast examination was performed using  thin barium.
FLUOROSCOPY TIME:  Fluoroscopy Time:  1 minute, 42 seconds.
Radiation Exposure Index (if provided by the fluoroscopic device):
1.3 mGy
Number of Acquired Spot Images: 0

[Series 1: t abdomen supine · 0.15mm/px · 1 of 1 slices shown]
[im 1/1]
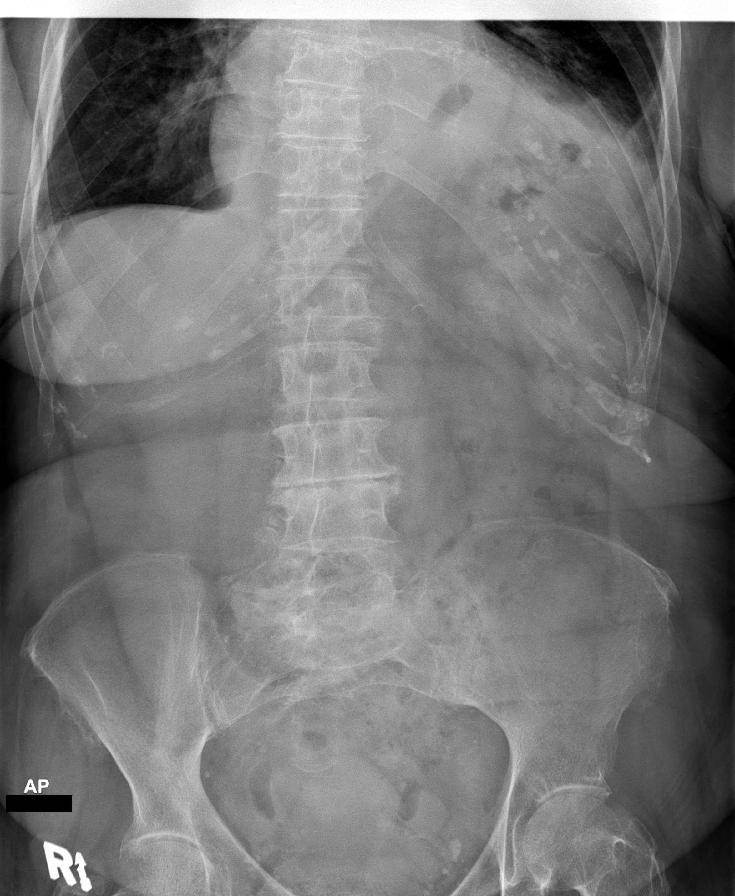

[Series 2: cp_standard · 0.52mm/px · 2 of 130 frames shown (1 of 7)]
[frame 66/130]
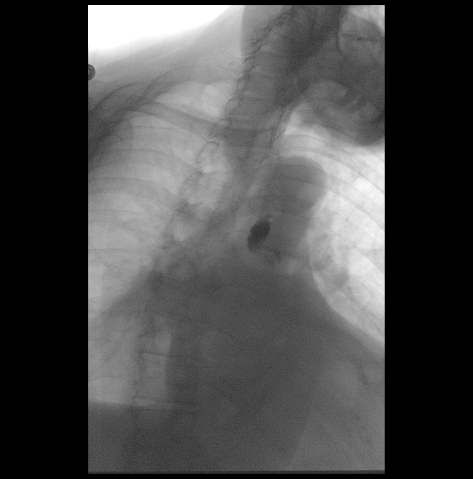
[frame 111/130]
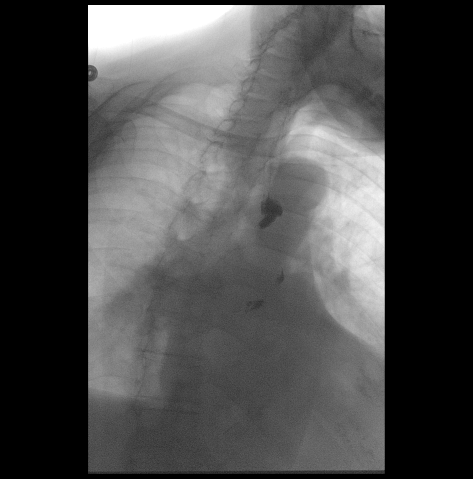

[Series 3: cp_standard · 0.52mm/px · 2 of 49 frames shown (2 of 7)]
[frame 8/49]
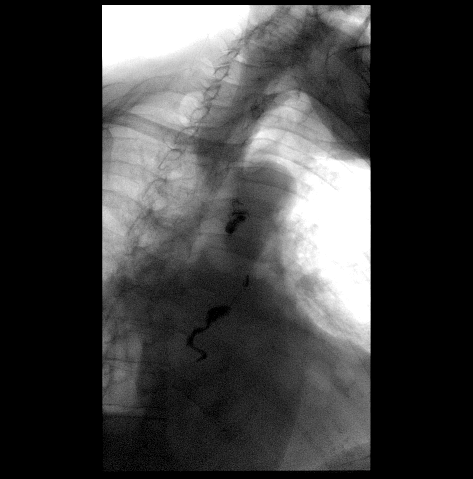
[frame 42/49]
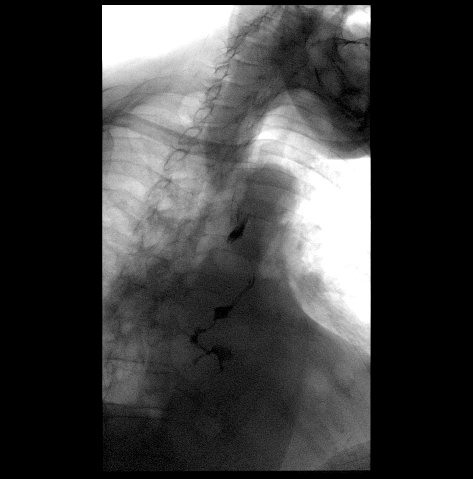

[Series 5: cp_standard · 0.34mm/px · 3 of 82 frames shown (3 of 7)]
[frame 12/82]
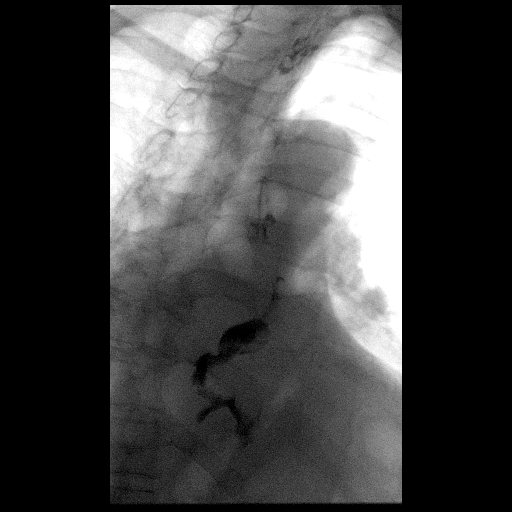
[frame 13/82]
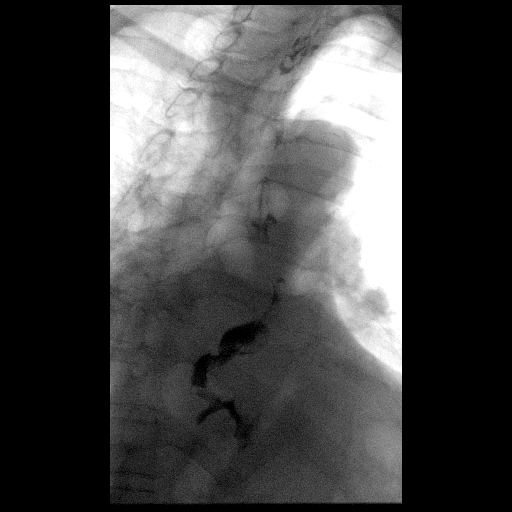
[frame 70/82]
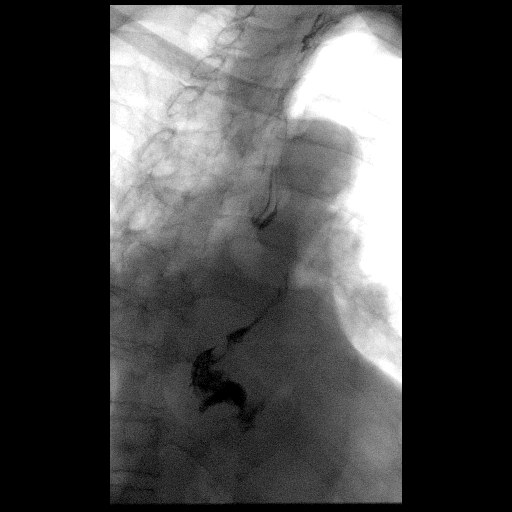

[Series 6: cp_standard · 0.34mm/px · 2 of 86 frames shown (4 of 7)]
[frame 13/86]
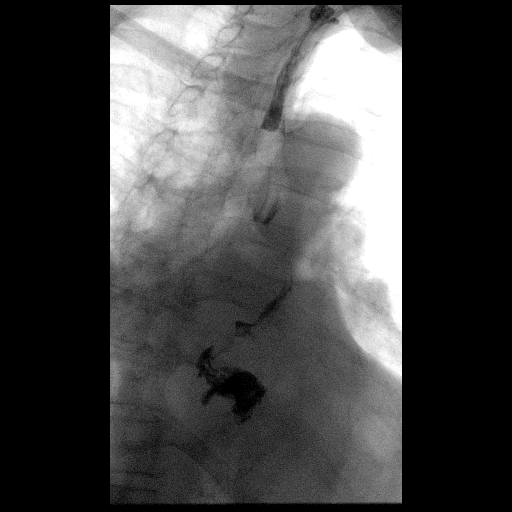
[frame 44/86]
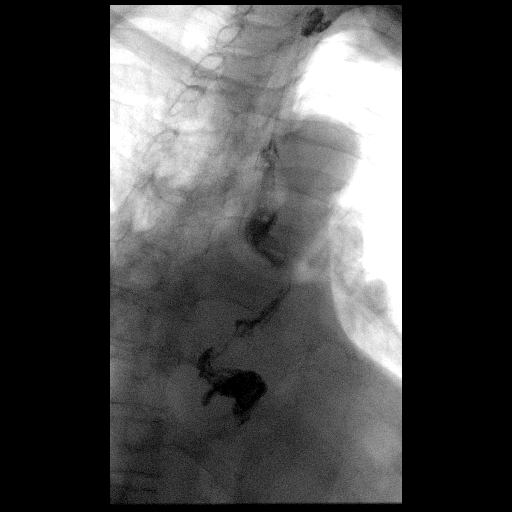

[Series 7: cp_standard · 0.26mm/px · 1 of 1 slices shown (5 of 7)]
[im 1/1]
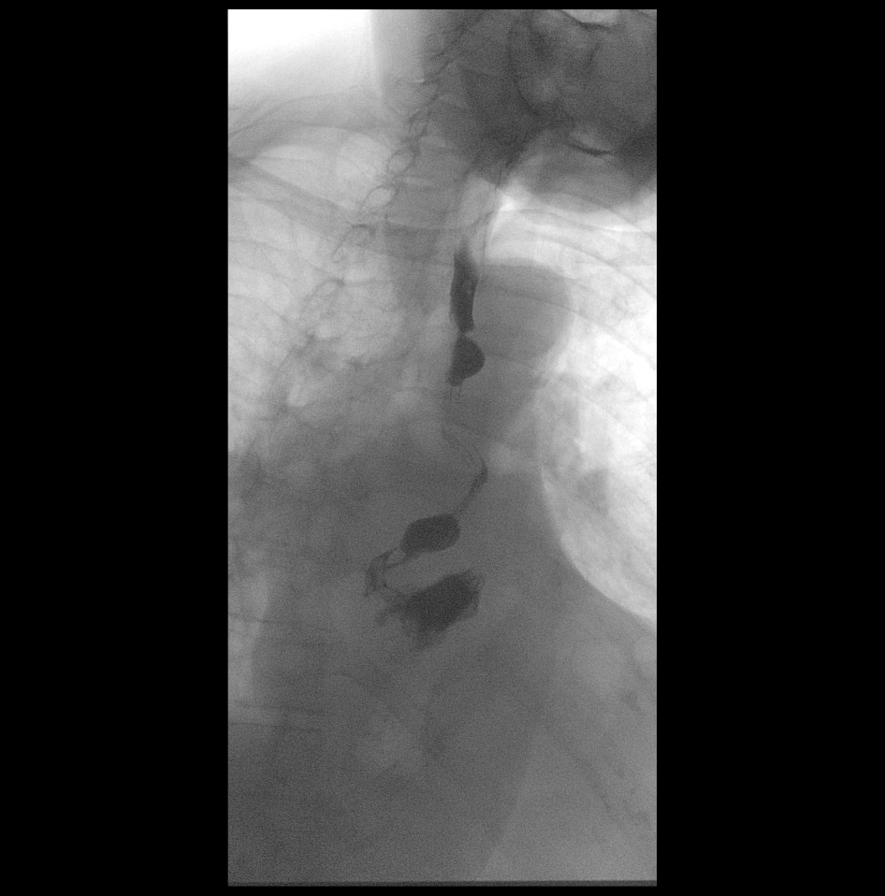

[Series 8: cp_standard · 0.52mm/px · 2 of 102 frames shown (6 of 7)]
[frame 1/102]
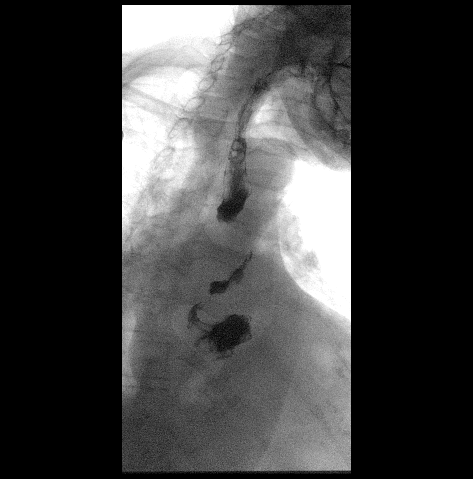
[frame 52/102]
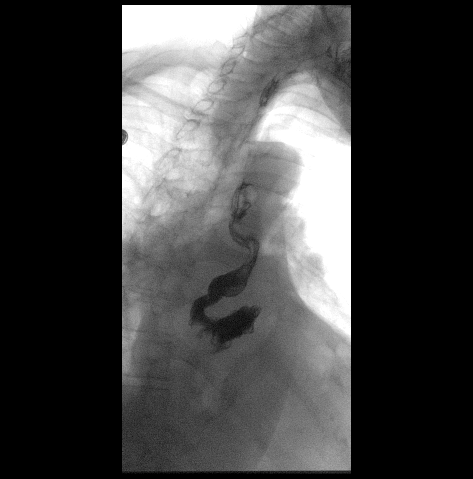

[Series 9: cp_standard · 0.17mm/px · 1 of 1 slices shown (7 of 7)]
[im 1/1]
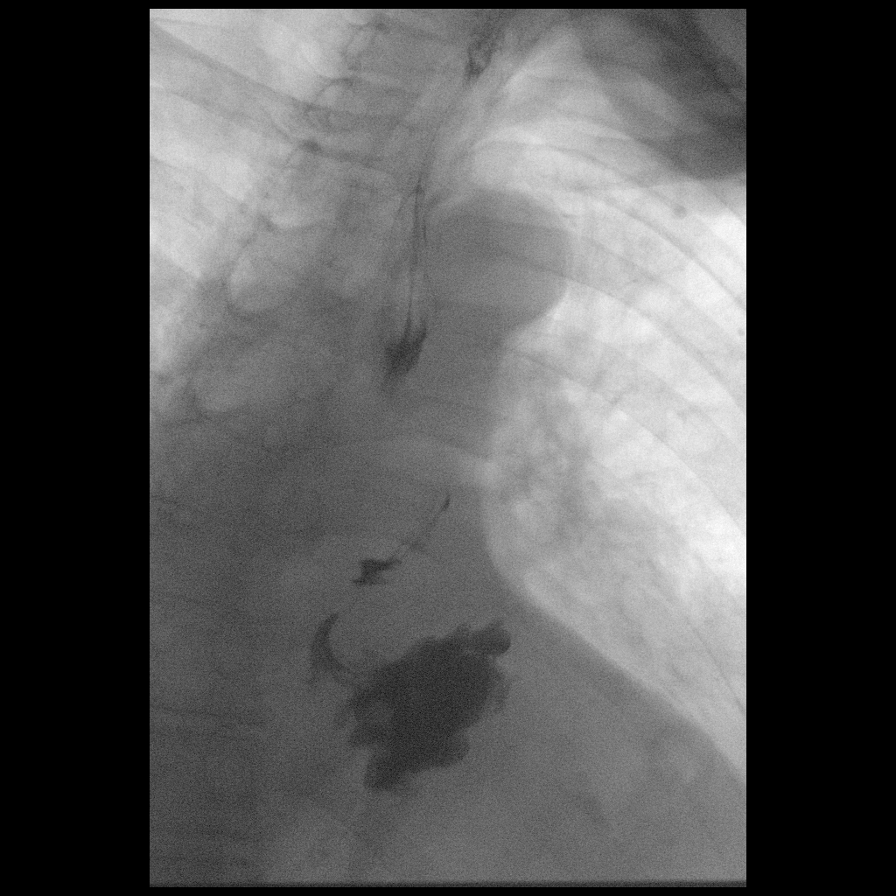

[14 of 23 positions shown; findings below may reference images not displayed]

FINDINGS: Preliminary KUB demonstrates a nonobstructive bowel gas pattern.
Unchanged small left pleural effusion and left basilar atelectasis
versus infiltrate.

Limited study due to patient's refusal to swallow a significant
amount of oral contrast. Only the esophagus was therefore evaluated.

There are moderate tertiary contractions of the distal esophagus,
which has a slightly corkscrew appearance. No obstruction to the
forward flow of contrast throughout the esophagus and into the
stomach. Large hiatal hernia.
IMPRESSION: 1. Limited evaluation of the esophagus only due to patient's refusal
to swallow a significant amount of oral contrast.
2. Prominent tertiary contractions within the distal esophagus,
which has a slightly corkscrew appearance, as can be seen with
diffuse esophageal spasm or nonspecific esophageal motility
disorder.
3. Large hiatal hernia.

## 2020-05-22 ENCOUNTER — Encounter: Payer: Medicare Other | Admitting: Internal Medicine
